# Patient Record
Sex: Male | Born: 1975 | Race: Black or African American | Hispanic: No | Marital: Single | Smoking: Former smoker
Health system: Southern US, Community
[De-identification: ages and names within clinical notes are randomized; demographics above are authoritative.]

## PROBLEM LIST (undated history)

## (undated) ENCOUNTER — Emergency Department (HOSPITAL_COMMUNITY): Admission: EM | Payer: 59 | Source: Home / Self Care

## (undated) DIAGNOSIS — M549 Dorsalgia, unspecified: Secondary | ICD-10-CM

## (undated) DIAGNOSIS — K219 Gastro-esophageal reflux disease without esophagitis: Secondary | ICD-10-CM

## (undated) DIAGNOSIS — F419 Anxiety disorder, unspecified: Secondary | ICD-10-CM

## (undated) DIAGNOSIS — H9192 Unspecified hearing loss, left ear: Secondary | ICD-10-CM

## (undated) DIAGNOSIS — R569 Unspecified convulsions: Secondary | ICD-10-CM

## (undated) DIAGNOSIS — G4730 Sleep apnea, unspecified: Secondary | ICD-10-CM

## (undated) DIAGNOSIS — F32A Depression, unspecified: Secondary | ICD-10-CM

## (undated) DIAGNOSIS — F329 Major depressive disorder, single episode, unspecified: Secondary | ICD-10-CM

## (undated) DIAGNOSIS — J45909 Unspecified asthma, uncomplicated: Secondary | ICD-10-CM

## (undated) DIAGNOSIS — I10 Essential (primary) hypertension: Secondary | ICD-10-CM

## (undated) DIAGNOSIS — G8929 Other chronic pain: Secondary | ICD-10-CM

---

## 2001-10-02 ENCOUNTER — Encounter: Payer: Self-pay | Admitting: Emergency Medicine

## 2001-10-02 ENCOUNTER — Emergency Department (HOSPITAL_COMMUNITY): Admission: EM | Admit: 2001-10-02 | Discharge: 2001-10-02 | Payer: Self-pay | Admitting: Emergency Medicine

## 2002-06-09 ENCOUNTER — Emergency Department (HOSPITAL_COMMUNITY): Admission: EM | Admit: 2002-06-09 | Discharge: 2002-06-09 | Payer: Self-pay | Admitting: Emergency Medicine

## 2002-12-05 ENCOUNTER — Encounter: Payer: Self-pay | Admitting: Emergency Medicine

## 2002-12-05 ENCOUNTER — Emergency Department (HOSPITAL_COMMUNITY): Admission: EM | Admit: 2002-12-05 | Discharge: 2002-12-05 | Payer: Self-pay | Admitting: Emergency Medicine

## 2003-02-09 ENCOUNTER — Emergency Department (HOSPITAL_COMMUNITY): Admission: EM | Admit: 2003-02-09 | Discharge: 2003-02-10 | Payer: Self-pay | Admitting: Emergency Medicine

## 2003-02-19 ENCOUNTER — Emergency Department (HOSPITAL_COMMUNITY): Admission: EM | Admit: 2003-02-19 | Discharge: 2003-02-19 | Payer: Self-pay | Admitting: Emergency Medicine

## 2003-02-27 ENCOUNTER — Emergency Department (HOSPITAL_COMMUNITY): Admission: EM | Admit: 2003-02-27 | Discharge: 2003-02-27 | Payer: Self-pay | Admitting: Emergency Medicine

## 2003-02-28 ENCOUNTER — Emergency Department (HOSPITAL_COMMUNITY): Admission: EM | Admit: 2003-02-28 | Discharge: 2003-02-28 | Payer: Self-pay | Admitting: Emergency Medicine

## 2003-10-18 ENCOUNTER — Emergency Department (HOSPITAL_COMMUNITY): Admission: EM | Admit: 2003-10-18 | Discharge: 2003-10-18 | Payer: Self-pay | Admitting: Emergency Medicine

## 2003-12-04 ENCOUNTER — Emergency Department (HOSPITAL_COMMUNITY): Admission: EM | Admit: 2003-12-04 | Discharge: 2003-12-04 | Payer: Self-pay | Admitting: Emergency Medicine

## 2004-02-03 ENCOUNTER — Emergency Department (HOSPITAL_COMMUNITY): Admission: EM | Admit: 2004-02-03 | Discharge: 2004-02-04 | Payer: Self-pay | Admitting: Emergency Medicine

## 2004-02-10 ENCOUNTER — Emergency Department (HOSPITAL_COMMUNITY): Admission: EM | Admit: 2004-02-10 | Discharge: 2004-02-10 | Payer: Self-pay | Admitting: Emergency Medicine

## 2004-05-16 ENCOUNTER — Emergency Department (HOSPITAL_COMMUNITY): Admission: EM | Admit: 2004-05-16 | Discharge: 2004-05-17 | Payer: Self-pay | Admitting: Emergency Medicine

## 2005-05-11 ENCOUNTER — Emergency Department (HOSPITAL_COMMUNITY): Admission: EM | Admit: 2005-05-11 | Discharge: 2005-05-11 | Payer: Self-pay | Admitting: Emergency Medicine

## 2005-12-31 ENCOUNTER — Emergency Department (HOSPITAL_COMMUNITY): Admission: EM | Admit: 2005-12-31 | Discharge: 2005-12-31 | Payer: Self-pay | Admitting: Emergency Medicine

## 2006-04-22 ENCOUNTER — Ambulatory Visit: Payer: Self-pay | Admitting: Family Medicine

## 2006-04-23 ENCOUNTER — Encounter: Payer: Self-pay | Admitting: Family Medicine

## 2006-04-23 DIAGNOSIS — K219 Gastro-esophageal reflux disease without esophagitis: Secondary | ICD-10-CM | POA: Insufficient documentation

## 2006-04-23 DIAGNOSIS — R569 Unspecified convulsions: Secondary | ICD-10-CM

## 2006-04-23 DIAGNOSIS — J309 Allergic rhinitis, unspecified: Secondary | ICD-10-CM | POA: Insufficient documentation

## 2006-04-23 DIAGNOSIS — E669 Obesity, unspecified: Secondary | ICD-10-CM

## 2006-04-23 DIAGNOSIS — J45909 Unspecified asthma, uncomplicated: Secondary | ICD-10-CM | POA: Insufficient documentation

## 2006-04-23 DIAGNOSIS — J40 Bronchitis, not specified as acute or chronic: Secondary | ICD-10-CM

## 2006-04-23 DIAGNOSIS — I10 Essential (primary) hypertension: Secondary | ICD-10-CM | POA: Insufficient documentation

## 2006-04-23 DIAGNOSIS — F329 Major depressive disorder, single episode, unspecified: Secondary | ICD-10-CM

## 2006-04-23 LAB — CONVERTED CEMR LAB
ALT: 28 units/L (ref 0–53)
AST: 18 units/L (ref 0–37)
Albumin: 4.1 g/dL (ref 3.5–5.2)
Alkaline Phosphatase: 89 units/L (ref 39–117)
BUN: 13 mg/dL (ref 6–23)
CO2: 22 meq/L (ref 19–32)
Calcium: 9.3 mg/dL (ref 8.4–10.5)
Chloride: 107 meq/L (ref 96–112)
Cholesterol: 179 mg/dL (ref 0–200)
Creatinine, Ser: 1 mg/dL (ref 0.40–1.50)
Glucose, Bld: 113 mg/dL — ABNORMAL HIGH (ref 70–99)
HDL: 39 mg/dL — ABNORMAL LOW (ref >39)
LDL Cholesterol: 128 mg/dL — ABNORMAL HIGH (ref 0–99)
Potassium: 3.9 meq/L (ref 3.5–5.3)
Sodium: 142 meq/L (ref 135–145)
Total Bilirubin: 0.5 mg/dL (ref 0.3–1.2)
Total CHOL/HDL Ratio: 4.6
Total Protein: 7 g/dL (ref 6.0–8.3)
Triglycerides: 62 mg/dL (ref <150)
VLDL: 12 mg/dL (ref 0–40)

## 2006-05-06 ENCOUNTER — Ambulatory Visit: Payer: Self-pay | Admitting: Family Medicine

## 2006-05-07 ENCOUNTER — Ambulatory Visit: Payer: Self-pay | Admitting: Family Medicine

## 2006-05-08 ENCOUNTER — Encounter (INDEPENDENT_AMBULATORY_CARE_PROVIDER_SITE_OTHER): Payer: Self-pay | Admitting: Family Medicine

## 2006-05-08 LAB — CONVERTED CEMR LAB
ALT: 27 units/L (ref 0–53)
AST: 21 units/L (ref 0–37)
Albumin: 3.9 g/dL (ref 3.5–5.2)
Alkaline Phosphatase: 83 units/L (ref 39–117)
BUN: 15 mg/dL (ref 6–23)
Basophils Absolute: 0 10*3/uL (ref 0.0–0.1)
Basophils Relative: 0 % (ref 0–1)
CO2: 14 meq/L — ABNORMAL LOW (ref 19–32)
Calcium: 8.8 mg/dL (ref 8.4–10.5)
Chloride: 110 meq/L (ref 96–112)
Cholesterol: 175 mg/dL (ref 0–200)
Creatinine, Ser: 0.87 mg/dL (ref 0.40–1.50)
Eosinophils Absolute: 0.2 10*3/uL (ref 0.0–0.7)
Eosinophils Relative: 2 % (ref 0–5)
Glucose, Bld: 119 mg/dL — ABNORMAL HIGH (ref 70–99)
HCT: 46.5 % (ref 39.0–52.0)
HDL: 38 mg/dL — ABNORMAL LOW (ref >39)
Hemoglobin: 14.9 g/dL (ref 13.0–17.0)
LDL Cholesterol: 121 mg/dL — ABNORMAL HIGH (ref 0–99)
Lymphocytes Relative: 40 % (ref 12–46)
Lymphs Abs: 2.6 10*3/uL (ref 0.7–3.3)
MCHC: 32 g/dL (ref 30.0–36.0)
MCV: 82.3 fL (ref 78.0–100.0)
Monocytes Absolute: 0.4 10*3/uL (ref 0.2–0.7)
Monocytes Relative: 7 % (ref 3–11)
Neutro Abs: 3.3 10*3/uL (ref 1.7–7.7)
Neutrophils Relative %: 51 % (ref 43–77)
Platelets: 240 10*3/uL (ref 150–400)
Potassium: 4.1 meq/L (ref 3.5–5.3)
RBC: 5.65 M/uL (ref 4.22–5.81)
RDW: 13.6 % (ref 11.5–14.0)
Sodium: 141 meq/L (ref 135–145)
TSH: 3.54 microintl units/mL (ref 0.350–5.50)
Total Bilirubin: 0.4 mg/dL (ref 0.3–1.2)
Total CHOL/HDL Ratio: 4.6
Total Protein: 6.8 g/dL (ref 6.0–8.3)
Triglycerides: 81 mg/dL (ref <150)
VLDL: 16 mg/dL (ref 0–40)
WBC: 6.4 10*3/uL (ref 4.0–10.5)

## 2006-06-05 ENCOUNTER — Encounter (INDEPENDENT_AMBULATORY_CARE_PROVIDER_SITE_OTHER): Payer: Self-pay | Admitting: Family Medicine

## 2006-06-27 ENCOUNTER — Ambulatory Visit: Payer: Self-pay | Admitting: Family Medicine

## 2006-06-27 DIAGNOSIS — G4733 Obstructive sleep apnea (adult) (pediatric): Secondary | ICD-10-CM | POA: Insufficient documentation

## 2006-06-27 DIAGNOSIS — R7989 Other specified abnormal findings of blood chemistry: Secondary | ICD-10-CM | POA: Insufficient documentation

## 2006-06-27 DIAGNOSIS — E785 Hyperlipidemia, unspecified: Secondary | ICD-10-CM

## 2006-06-27 DIAGNOSIS — G4730 Sleep apnea, unspecified: Secondary | ICD-10-CM | POA: Insufficient documentation

## 2006-06-27 DIAGNOSIS — J301 Allergic rhinitis due to pollen: Secondary | ICD-10-CM | POA: Insufficient documentation

## 2006-06-27 LAB — CONVERTED CEMR LAB
Cholesterol, target level: 200 mg/dL
Glucose, Bld: 116 mg/dL
HDL goal, serum: 40 mg/dL
Hgb A1c MFr Bld: 5.6 %
LDL Goal: 130 mg/dL

## 2006-07-01 ENCOUNTER — Encounter (INDEPENDENT_AMBULATORY_CARE_PROVIDER_SITE_OTHER): Payer: Self-pay | Admitting: Family Medicine

## 2006-11-04 ENCOUNTER — Encounter (INDEPENDENT_AMBULATORY_CARE_PROVIDER_SITE_OTHER): Payer: Self-pay | Admitting: Family Medicine

## 2008-01-28 ENCOUNTER — Emergency Department (HOSPITAL_COMMUNITY): Admission: EM | Admit: 2008-01-28 | Discharge: 2008-01-28 | Payer: Self-pay | Admitting: Emergency Medicine

## 2008-02-03 ENCOUNTER — Ambulatory Visit: Payer: Self-pay | Admitting: Family Medicine

## 2008-02-03 DIAGNOSIS — L659 Nonscarring hair loss, unspecified: Secondary | ICD-10-CM | POA: Insufficient documentation

## 2008-02-03 DIAGNOSIS — H5789 Other specified disorders of eye and adnexa: Secondary | ICD-10-CM

## 2008-04-06 ENCOUNTER — Encounter (INDEPENDENT_AMBULATORY_CARE_PROVIDER_SITE_OTHER): Payer: Self-pay | Admitting: Family Medicine

## 2011-02-19 ENCOUNTER — Emergency Department (HOSPITAL_COMMUNITY)
Admission: EM | Admit: 2011-02-19 | Discharge: 2011-02-19 | Disposition: A | Discharge status: Home / Self Care | Payer: BC Managed Care – PPO | Attending: Emergency Medicine | Admitting: Emergency Medicine

## 2011-02-19 ENCOUNTER — Encounter: Payer: Self-pay | Admitting: Emergency Medicine

## 2011-02-19 ENCOUNTER — Other Ambulatory Visit: Payer: Self-pay

## 2011-02-19 ENCOUNTER — Emergency Department (HOSPITAL_COMMUNITY): Payer: BC Managed Care – PPO

## 2011-02-19 DIAGNOSIS — R5381 Other malaise: Secondary | ICD-10-CM | POA: Insufficient documentation

## 2011-02-19 DIAGNOSIS — Y9269 Other specified industrial and construction area as the place of occurrence of the external cause: Secondary | ICD-10-CM | POA: Insufficient documentation

## 2011-02-19 DIAGNOSIS — R61 Generalized hyperhidrosis: Secondary | ICD-10-CM | POA: Insufficient documentation

## 2011-02-19 DIAGNOSIS — I10 Essential (primary) hypertension: Secondary | ICD-10-CM

## 2011-02-19 DIAGNOSIS — R5383 Other fatigue: Secondary | ICD-10-CM | POA: Insufficient documentation

## 2011-02-19 DIAGNOSIS — R079 Chest pain, unspecified: Secondary | ICD-10-CM | POA: Insufficient documentation

## 2011-02-19 DIAGNOSIS — X500XXA Overexertion from strenuous movement or load, initial encounter: Secondary | ICD-10-CM | POA: Insufficient documentation

## 2011-02-19 DIAGNOSIS — R0602 Shortness of breath: Secondary | ICD-10-CM | POA: Insufficient documentation

## 2011-02-19 LAB — CBC
HCT: 41.4 % (ref 39.0–52.0)
Hemoglobin: 13.3 g/dL (ref 13.0–17.0)
MCH: 26.2 pg (ref 26.0–34.0)
MCHC: 32.1 g/dL (ref 30.0–36.0)
MCV: 81.7 fL (ref 78.0–100.0)
Platelets: 255 10*3/uL (ref 150–400)
RBC: 5.07 MIL/uL (ref 4.22–5.81)
RDW: 13.1 % (ref 11.5–15.5)
WBC: 4.9 10*3/uL (ref 4.0–10.5)

## 2011-02-19 LAB — CARDIAC PANEL(CRET KIN+CKTOT+MB+TROPI)
CK, MB: 13.8 ng/mL (ref 0.3–4.0)
Relative Index: 2.1 (ref 0.0–2.5)
Total CK: 663 U/L — ABNORMAL HIGH (ref 7–232)
Troponin I: <0.3 ng/mL (ref <0.30)

## 2011-02-19 LAB — BASIC METABOLIC PANEL
BUN: 10 mg/dL (ref 6–23)
CO2: 24 mEq/L (ref 19–32)
Calcium: 9.2 mg/dL (ref 8.4–10.5)
Chloride: 106 mEq/L (ref 96–112)
Creatinine, Ser: 0.98 mg/dL (ref 0.50–1.35)
GFR calc Af Amer: >90 mL/min (ref >90)
GFR calc non Af Amer: >90 mL/min (ref >90)
Glucose, Bld: 99 mg/dL (ref 70–99)
Potassium: 3.6 mEq/L (ref 3.5–5.1)
Sodium: 137 mEq/L (ref 135–145)

## 2011-02-19 LAB — DIFFERENTIAL
Basophils Absolute: 0 10*3/uL (ref 0.0–0.1)
Basophils Relative: 0 % (ref 0–1)
Eosinophils Absolute: 0.2 10*3/uL (ref 0.0–0.7)
Eosinophils Relative: 3 % (ref 0–5)
Lymphocytes Relative: 43 % (ref 12–46)
Lymphs Abs: 2.1 10*3/uL (ref 0.7–4.0)
Monocytes Absolute: 0.4 10*3/uL (ref 0.1–1.0)
Monocytes Relative: 9 % (ref 3–12)
Neutro Abs: 2.2 10*3/uL (ref 1.7–7.7)
Neutrophils Relative %: 45 % (ref 43–77)

## 2011-02-19 MED ORDER — ASPIRIN 81 MG PO CHEW: 81.0000 mg | CHEWABLE_TABLET | Freq: Every day | ORAL | Status: DC

## 2011-02-19 MED ORDER — NITROGLYCERIN 0.4 MG SL SUBL
0.4000 mg | SUBLINGUAL_TABLET | Freq: Once | SUBLINGUAL | Status: AC
  Administered 2011-02-19: 0.4 mg via SUBLINGUAL
  Filled 2011-02-19: qty 25

## 2011-02-19 MED ORDER — ASPIRIN 81 MG PO CHEW
324.0000 mg | CHEWABLE_TABLET | Freq: Once | ORAL | Status: AC
  Administered 2011-02-19: 324 mg via ORAL
  Filled 2011-02-19: qty 4

## 2011-02-19 NOTE — ED Notes (Signed)
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 States he has to lift heavy boxes at work and has not been resting a lot.

## 2011-02-19 NOTE — ED Notes (Signed)
Pt c/o sharp intermittent chest pain since yesterday.

## 2011-02-19 NOTE — ED Provider Notes (Signed)
History     CSN: [id number] Arrival date & time: 02/19/2011  6:10 PM   First MD Initiated Contact with Patient 02/19/11 1907     Chief Complaint  Patient presents with  . Chest Pain     Patient is a 35 y.o. male presenting with chest pain. The history is provided by the patient.  Chest Pain The chest pain began yesterday. Duration of episode(s) is 1 hour. Chest pain occurs intermittently. The chest pain is improving. The pain is associated with exertion. The severity of the pain is moderate. The quality of the pain is described as pressure-like. The pain does not radiate. Chest pain is worsened by exertion. Primary symptoms include fatigue and shortness of breath.  Associated symptoms include diaphoresis. He tried nothing for the symptoms.   pt reports while at work yesterday he was lifting heavy bags (50lbs) and noted chest pressure/sob/diaphoresis.  Relieved after sitting down He had another episode today at rest Now CP free Never had this before No known medical problems  PMH - none  History reviewed. No pertinent past surgical history.  History reviewed. No pertinent family history.  History  Substance Use Topics  . Smoking status: Not on file  . Smokeless tobacco: Not on file  . Alcohol Use:    Soc hx - negative for smoking   Review of Systems  Constitutional: Positive for diaphoresis and fatigue.  Respiratory: Positive for shortness of breath.   Cardiovascular: Positive for chest pain.  All other systems reviewed and are negative.    Allergies  Review of patient's allergies indicates no known allergies.  Home Medications  No current outpatient prescriptions on file.  BP 186/89  Pulse 87  Temp(Src) 98.4 F (36.9 C) (Oral)  Resp 20  Ht 5\' 11"  (1.803 m)  SpO2 99%  Physical Exam  CONSTITUTIONAL: Well developed/well nourished HEAD AND FACE: Normocephalic/atraumatic EYES: EOMI/PERRL ENMT: Mucous membranes moist NECK: supple no meningeal  signs SPINE:entire spine nontender CV: S1/S2 noted, no murmurs/rubs/gallops noted LUNGS: Lungs are clear to auscultation bilaterally, no apparent distress ABDOMEN: soft, nontender, no rebound or guarding, obese NEURO: Pt is awake/alert, moves all extremitiesx4 EXTREMITIES: pulses normal, full ROM SKIN: warm, color normal PSYCH: no abnormalities of mood noted    ED Course  Procedures   Labs Reviewed  CARDIAC PANEL(CRET KIN+CKTOT+MB+TROPI) - Abnormal; Notable for the following:    Total CK 663 (*)    All other components within normal limits  CBC  DIFFERENTIAL  BASIC METABOLIC PANEL   Dg Chest Port 1 View  02/19/2011  *RADIOLOGY REPORT*  Clinical Data: Central chest pain following heavy lifting today.  PORTABLE CHEST - 1 VIEW  Comparison: 02/04/2004.  Findings: Enlarged cardiac silhouette with a mild increase in size, magnified by the portable AP technique.  Clear lungs with normal vascularity.  The visualized bones are unremarkable.  IMPRESSION: Cardiomegaly.  No acute abnormality.  Original Report Authenticated By: Darrol Angel, M.D.    I had a long discussion with patient and his family.  I told him this could be related to ACS, though he had negative troponin and nondiagnostic EKG.  I told him this could worsen and I offered him admission.  He refused admission, he reports he feels improved, and will f/u as outpatient.  I started him on ASA and I advised him on need for f/u for his BP.  My suspicion for PE/Dissection is low.  I advised him to return at anytime  MDM  Nursing notes reviewed and  considered in documentation All labs/vitals reviewed and considered xrays reviewed and considered    Date: 02/19/2011  Rate: 80  Rhythm: normal sinus rhythm  QRS Axis: right  Intervals: normal  ST/T Wave abnormalities: normal and poor R wave progression  Conduction Disutrbances:none  Narrative Interpretation:   Old EKG Reviewed: none available          Joya Gaskins,  MD 02/19/11 2142

## 2011-03-16 ENCOUNTER — Emergency Department (HOSPITAL_COMMUNITY): Payer: BC Managed Care – PPO

## 2011-03-16 ENCOUNTER — Emergency Department (HOSPITAL_COMMUNITY)
Admission: EM | Admit: 2011-03-16 | Discharge: 2011-03-16 | Disposition: A | Discharge status: Home / Self Care | Payer: BC Managed Care – PPO | Attending: Emergency Medicine | Admitting: Emergency Medicine

## 2011-03-16 ENCOUNTER — Other Ambulatory Visit: Payer: Self-pay

## 2011-03-16 ENCOUNTER — Encounter (HOSPITAL_COMMUNITY): Payer: Self-pay | Admitting: *Deleted

## 2011-03-16 DIAGNOSIS — R071 Chest pain on breathing: Secondary | ICD-10-CM | POA: Insufficient documentation

## 2011-03-16 DIAGNOSIS — Z7982 Long term (current) use of aspirin: Secondary | ICD-10-CM | POA: Insufficient documentation

## 2011-03-16 DIAGNOSIS — R5381 Other malaise: Secondary | ICD-10-CM | POA: Insufficient documentation

## 2011-03-16 LAB — BASIC METABOLIC PANEL
BUN: 17 mg/dL (ref 6–23)
CO2: 25 mEq/L (ref 19–32)
Calcium: 9.6 mg/dL (ref 8.4–10.5)
Chloride: 105 mEq/L (ref 96–112)
Creatinine, Ser: 1.06 mg/dL (ref 0.50–1.35)
GFR calc Af Amer: >90 mL/min (ref >90)
GFR calc non Af Amer: >90 mL/min (ref >90)
Glucose, Bld: 96 mg/dL (ref 70–99)
Potassium: 3.6 mEq/L (ref 3.5–5.1)
Sodium: 139 mEq/L (ref 135–145)

## 2011-03-16 LAB — CBC
HCT: 46.8 % (ref 39.0–52.0)
Hemoglobin: 15.4 g/dL (ref 13.0–17.0)
MCH: 27 pg (ref 26.0–34.0)
MCHC: 32.9 g/dL (ref 30.0–36.0)
MCV: 82.1 fL (ref 78.0–100.0)
Platelets: 275 10*3/uL (ref 150–400)
RBC: 5.7 MIL/uL (ref 4.22–5.81)
RDW: 13.2 % (ref 11.5–15.5)
WBC: 7.2 10*3/uL (ref 4.0–10.5)

## 2011-03-16 LAB — POCT I-STAT TROPONIN I: Troponin i, poc: 0 ng/mL (ref 0.00–0.08)

## 2011-03-16 MED ORDER — IBUPROFEN 800 MG PO TABS: 800.0000 mg | ORAL_TABLET | Freq: Three times a day (TID) | ORAL | Status: AC

## 2011-03-16 NOTE — ED Provider Notes (Signed)
7:31 PM  I performed a history and physical examination of Bobby Bridges and discussed his management with Dr. Louanne Belton  I agree with the history, physical, assessment, and plan of care, with the following exceptions: None  I was present for the following procedures: None Time Spent in Critical Care of the patient: None Time spent in discussions with the patient and family: 5 minutes  Manus Rudd, MD 03/16/11 1932

## 2011-03-16 NOTE — ED Notes (Signed)
Pt c/o left sided chest pain x 3 weeks

## 2011-03-16 NOTE — ED Provider Notes (Signed)
History     CSN: [id number] Arrival date & time: 03/16/2011  5:24 PM   First MD Initiated Contact with Patient 03/16/11 1728      Chief Complaint  Patient presents with  . Chest Pain   HPI 35 yo obese M with no known medical problems who presents with complaints of intermittent left sided CP and low energy x3 weeks.  Pt has been previously seen in the ED for similar complaints about 3 weeks ago and workup was negative.  He reports that, since that time, he has continued to have dull, achy left sided chest pain intermittently along with general lack of energy.  He does not report any radiation of the chest pain, it does not appear to be associated with exertion, is relieved by nothing, and brought on by nothing.  Location is at the base of the rib cage.  Otherwise, does not report any fevers/chills, visual changes, lightheadedness, or other concerning symptoms.  History reviewed. No pertinent past medical history.  History reviewed. No pertinent past surgical history.  History reviewed. No pertinent family history.  History  Substance Use Topics  . Smoking status: Never Smoker   . Smokeless tobacco: Not on file  . Alcohol Use: No      Review of Systems  Constitutional: Positive for fatigue. Negative for fever, chills, diaphoresis, appetite change and unexpected weight change.  HENT: Negative.   Eyes: Negative.   Respiratory: Negative for cough, chest tightness, shortness of breath, wheezing and stridor.   Cardiovascular: Positive for chest pain. Negative for palpitations and leg swelling.  Gastrointestinal: Negative.   Genitourinary: Negative.   Musculoskeletal: Negative.   Neurological: Negative.   Hematological: Negative.     Allergies  Review of patient's allergies indicates no known allergies.  Home Medications   Current Outpatient Rx  Name Route Sig Dispense Refill  . ASPIRIN 81 MG PO CHEW Oral Chew 1 tablet (81 mg total) by mouth daily. 14 tablet 0  . NYQUIL PO  Oral Take 10-15 mLs by mouth at bedtime as needed. For cold symptoms       BP 111/74  Pulse 79  Temp(Src) 98 F (36.7 C) (Oral)  Resp 22  SpO2 99%  Physical Exam  Constitutional: He is oriented to person, place, and time.       Morbidly obese male, no acute distress  HENT:  Head: Normocephalic and atraumatic.  Eyes: Conjunctivae and EOM are normal.  Neck: Normal range of motion. Neck supple.  Cardiovascular: Normal rate, regular rhythm and normal heart sounds.   Pulmonary/Chest: Effort normal and breath sounds normal. He exhibits tenderness.       Chest tenderness over the left 10th rib tip, otherwise no tenderness  Abdominal: Soft. Bowel sounds are normal. He exhibits no distension. There is no tenderness.  Musculoskeletal: Normal range of motion. He exhibits no edema.  Neurological: He is alert and oriented to person, place, and time. No cranial nerve deficit.  Skin: Skin is warm and dry.    ED Course  Procedures (including critical care time)   Labs Reviewed  CBC  BASIC METABOLIC PANEL  POCT I-STAT TROPONIN I  I-STAT TROPONIN I   Dg Chest Portable 1 View  03/16/2011  *RADIOLOGY REPORT*  Clinical Data: Chest pain.  PORTABLE CHEST - 1 VIEW  Comparison: Plain film chest 02/19/2011 and 02/04/2004.  Findings: There is cardiomegaly.  Lungs are clear.  No pneumothorax or pleural effusion.  IMPRESSION: Cardiomegaly without acute disease.  Original Report Authenticated By:  THOMAS L. Maricela Curet, M.D.     No diagnosis found.   Date: 03/16/2011  Rate: 82  Rhythm: normal sinus rhythm  QRS Axis: normal  Intervals: normal  ST/T Wave abnormalities: normal  Conduction Disutrbances:none  Narrative Interpretation: Normal EKG with no concerning findings  Old EKG Reviewed: unchanged     MDM  Pt with reproducible chest pain, no history of typical angina, currently chest pain free with normal EKG and negative troponin.  Will d/c home with NSAIDS and resource guide for finding  PCP.        Majel Homer, MD 03/16/11 [phone]

## 2011-03-18 NOTE — ED Provider Notes (Signed)
Evaluation and management procedures were performed by the resident physician under my supervision/collaboration.  I evaluated this patient face-to-face at the time of encounter.  Please see my note dated at that time.  Felisa Bonier, MD 03/18/11 [phone]

## 2011-07-28 ENCOUNTER — Encounter (HOSPITAL_COMMUNITY): Payer: Self-pay | Admitting: *Deleted

## 2011-07-28 ENCOUNTER — Emergency Department (HOSPITAL_COMMUNITY)
Admission: EM | Admit: 2011-07-28 | Discharge: 2011-07-28 | Disposition: A | Discharge status: Home / Self Care | Payer: BC Managed Care – PPO | Attending: Emergency Medicine | Admitting: Emergency Medicine

## 2011-07-28 DIAGNOSIS — X58XXXA Exposure to other specified factors, initial encounter: Secondary | ICD-10-CM | POA: Insufficient documentation

## 2011-07-28 DIAGNOSIS — R350 Frequency of micturition: Secondary | ICD-10-CM | POA: Insufficient documentation

## 2011-07-28 DIAGNOSIS — S335XXA Sprain of ligaments of lumbar spine, initial encounter: Secondary | ICD-10-CM | POA: Insufficient documentation

## 2011-07-28 DIAGNOSIS — R3915 Urgency of urination: Secondary | ICD-10-CM | POA: Insufficient documentation

## 2011-07-28 DIAGNOSIS — R109 Unspecified abdominal pain: Secondary | ICD-10-CM | POA: Insufficient documentation

## 2011-07-28 DIAGNOSIS — R300 Dysuria: Secondary | ICD-10-CM | POA: Insufficient documentation

## 2011-07-28 DIAGNOSIS — S39012A Strain of muscle, fascia and tendon of lower back, initial encounter: Secondary | ICD-10-CM

## 2011-07-28 LAB — URINALYSIS, ROUTINE W REFLEX MICROSCOPIC
Bilirubin Urine: NEGATIVE
Hgb urine dipstick: NEGATIVE
Ketones, ur: NEGATIVE mg/dL
Protein, ur: NEGATIVE mg/dL
Urobilinogen, UA: 0.2 mg/dL (ref 0.0–1.0)

## 2011-07-28 LAB — BASIC METABOLIC PANEL
Calcium: 9.1 mg/dL (ref 8.4–10.5)
Creatinine, Ser: 1.02 mg/dL (ref 0.50–1.35)
GFR calc non Af Amer: >90 mL/min (ref >90)
Glucose, Bld: 105 mg/dL — ABNORMAL HIGH (ref 70–99)
Sodium: 137 mEq/L (ref 135–145)

## 2011-07-28 LAB — CBC
MCH: 26.6 pg (ref 26.0–34.0)
MCV: 82.2 fL (ref 78.0–100.0)
Platelets: 235 10*3/uL (ref 150–400)
RDW: 13.3 % (ref 11.5–15.5)

## 2011-07-28 LAB — DIFFERENTIAL
Basophils Absolute: 0 10*3/uL (ref 0.0–0.1)
Eosinophils Absolute: 0.2 10*3/uL (ref 0.0–0.7)
Eosinophils Relative: 4 % (ref 0–5)

## 2011-07-28 MED ORDER — SODIUM CHLORIDE 0.9 % IV SOLN
Freq: Once | INTRAVENOUS | Status: AC
  Administered 2011-07-28: 20 mL/h via INTRAVENOUS

## 2011-07-28 MED ORDER — HYDROCODONE-ACETAMINOPHEN 5-325 MG PO TABS: 2.0000 | ORAL_TABLET | ORAL | Status: AC | PRN

## 2011-07-28 MED ORDER — IBUPROFEN 800 MG PO TABS: 800.0000 mg | ORAL_TABLET | Freq: Three times a day (TID) | ORAL | Status: AC | PRN

## 2011-07-28 MED ORDER — KETOROLAC TROMETHAMINE 30 MG/ML IJ SOLN
30.0000 mg | Freq: Once | INTRAMUSCULAR | Status: AC
  Administered 2011-07-28: 30 mg via INTRAVENOUS
  Filled 2011-07-28: qty 1

## 2011-07-28 MED ORDER — HYDROMORPHONE HCL PF 1 MG/ML IJ SOLN
1.0000 mg | Freq: Once | INTRAMUSCULAR | Status: AC
  Administered 2011-07-28: 1 mg via INTRAVENOUS
  Filled 2011-07-28: qty 1

## 2011-07-28 MED ORDER — ONDANSETRON HCL 4 MG/2ML IJ SOLN
4.0000 mg | Freq: Once | INTRAMUSCULAR | Status: AC
Start: 2011-07-28 — End: 2011-07-28
  Administered 2011-07-28: 4 mg via INTRAVENOUS
  Filled 2011-07-28: qty 2

## 2011-07-28 NOTE — ED Provider Notes (Signed)
History     CSN: [id number]  Arrival date & time 07/28/11  0153   First MD Initiated Contact with Patient 07/28/11 0154      Chief Complaint  Patient presents with  . Flank Pain  . Dysuria    (Consider location/radiation/quality/duration/timing/severity/associated sxs/prior treatment) Patient is a 36 y.o. male presenting with flank pain and dysuria. The history is provided by the patient.  Flank Pain This is a new problem. The current episode started more than 2 days ago (2-3 days ago). The problem occurs constantly. The problem has not changed since onset.Pertinent negatives include no chest pain, no abdominal pain, no headaches and no shortness of breath. Exacerbated by: palpation, twisting or torsion or movement through the torso. The symptoms are relieved by NSAIDs and acetaminophen (urinating).  Dysuria  This is a new problem. The current episode started 2 days ago. The problem occurs every urination. The problem has not changed since onset.The quality of the pain is described as aching (pain at the left flank improved by urination, with increased frequency of urination. No noted hematuria, and no burning with urination or penile/urethral pain. No penile discharge.). The pain is at a severity of 6/10. The pain is moderate. There has been no fever. There is no history of pyelonephritis. Associated symptoms include frequency, urgency and flank pain. Pertinent negatives include no chills, no sweats, no nausea, no vomiting, no discharge, no hematuria and no hesitancy. His past medical history does not include kidney stones or recurrent UTIs.    History reviewed. No pertinent past medical history.  History reviewed. No pertinent past surgical history.  History reviewed. No pertinent family history.  History  Substance Use Topics  . Smoking status: Never Smoker   . Smokeless tobacco: Not on file  . Alcohol Use: No      Review of Systems  Constitutional: Negative for fever,  chills, activity change, appetite change and fatigue.  HENT: Negative.   Eyes: Negative.   Respiratory: Negative for cough, shortness of breath and wheezing.   Cardiovascular: Negative for chest pain.  Gastrointestinal: Negative for nausea, vomiting, abdominal pain, diarrhea, constipation, blood in stool, abdominal distention, anal bleeding and rectal pain.  Genitourinary: Positive for dysuria, urgency, frequency and flank pain. Negative for hesitancy, hematuria, decreased urine volume, discharge, penile swelling, scrotal swelling, difficulty urinating, genital sores, penile pain and testicular pain.  Musculoskeletal: Positive for back pain.       Left flank pain without midline spinal pain  Skin: Negative for color change and rash.  Neurological: Negative for light-headedness and headaches.  Hematological: Does not bruise/bleed easily.  Psychiatric/Behavioral: Negative.     Allergies  Review of patient's allergies indicates no known allergies.  Home Medications   Current Outpatient Rx  Name Route Sig Dispense Refill  . ASPIRIN 81 MG PO CHEW Oral Chew 1 tablet (81 mg total) by mouth daily. 14 tablet 0    BP 128/79  Pulse 82  Temp 97.6 F (36.4 C)  Resp 20  Wt 338 lb 5 oz (153.458 kg)  SpO2 97%  Physical Exam  Nursing note and vitals reviewed. Constitutional: He is oriented to person, place, and time. He appears well-nourished. No distress.       The patient has very large body habitus and is morbidly obese  HENT:  Head: Normocephalic and atraumatic.  Eyes: Conjunctivae and EOM are normal. Pupils are equal, round, and reactive to light.  Neck: Normal range of motion. Neck supple. No JVD present.  Cardiovascular: Normal  rate, regular rhythm, normal heart sounds and intact distal pulses.  Exam reveals no gallop and no friction rub.   No murmur heard. Pulmonary/Chest: Effort normal and breath sounds normal. No respiratory distress. He has no wheezes. He has no rales. He  exhibits no tenderness.  Abdominal: Soft. Bowel sounds are normal. He exhibits no distension, no fluid wave, no ascites and no mass. There is no tenderness. There is CVA tenderness. There is no rebound and no guarding.    Musculoskeletal: Normal range of motion. He exhibits no edema and no tenderness.       Lumbar back: Normal. He exhibits normal range of motion, no tenderness, no bony tenderness, no deformity, no pain and no spasm.       Back:  Neurological: He is alert and oriented to person, place, and time. He has normal reflexes. No cranial nerve deficit. He exhibits normal muscle tone. Coordination normal.  Skin: Skin is warm and dry. No rash noted. He is not diaphoretic. No erythema. No pallor.  Psychiatric: He has a normal mood and affect. His behavior is normal. Judgment and thought content normal.    ED Course  Procedures (including critical care time)  Labs Reviewed  URINALYSIS, ROUTINE W REFLEX MICROSCOPIC - Abnormal; Notable for the following:    Specific Gravity, Urine >1.030 (*)    All other components within normal limits  CBC  DIFFERENTIAL  BASIC METABOLIC PANEL  URINE CULTURE   No results found.   No diagnosis found.    MDM  Ureteric lithiasis, kidney stone, renal colic, urinary tract infection, musculoskeletal pain due to muscular strain. Lumbar spine pathology is not suspected to be at play or the cause of the patient's current symptoms as he has no pain or tenderness along the lumbar spine. No lumbar spine radiography is indicated based on history and physical examination. The patient's urinalysis does not suggest urinary tract infection or hematuria from kidney stone. Note CT scan of the abdomen and pelvis are thought to be needed at this time. The patient's symptoms worsening with palpation and torsional movement through the torso suggest myofascial pain or muscular strain. The patient's urinary symptoms and his pain improving with urination suggest renal colic  due to urinary tract infection or kidney stone.        Felisa Bonier, MD 07/28/11 [phone]

## 2011-07-28 NOTE — ED Notes (Addendum)
Pt c/o left flank/side pain, lower back pain,  and mild dysuria x 2 days. Pt also states he doesn't feel like he has any energy. Pt wants knot on right wrist evaluated as well.

## 2011-07-28 NOTE — ED Notes (Signed)
Patient walked here and is walking home.  Family member is walking with him.

## 2011-07-28 NOTE — Discharge Instructions (Signed)
Back Exercises Back exercises help treat and prevent back injuries. The goal of back exercises is to increase the strength of your abdominal and back muscles and the flexibility of your back. These exercises should be started when you no longer have back pain. Back exercises include:  Pelvic Tilt. Lie on your back with your knees bent. Tilt your pelvis until the lower part of your back is against the floor. Hold this position 5 to 10 sec and repeat 5 to 10 times.   Knee to Chest. Pull first 1 knee up against your chest and hold for 20 to 30 seconds, repeat this with the other knee, and then both knees. This may be done with the other leg straight or bent, whichever feels better.   Sit-Ups or Curl-Ups. Bend your knees 90 degrees. Start with tilting your pelvis, and do a partial, slow sit-up, lifting your trunk only 30 to 45 degrees off the floor. Take at least 2 to 3 seconds for each sit-up. Do not do sit-ups with your knees out straight. If partial sit-ups are difficult, simply do the above but with only tightening your abdominal muscles and holding it as directed.   Hip-Lift. Lie on your back with your knees flexed 90 degrees. Push down with your feet and shoulders as you raise your hips a couple inches off the floor; hold for 10 seconds, repeat 5 to 10 times.   Back arches. Lie on your stomach, propping yourself up on bent elbows. Slowly press on your hands, causing an arch in your low back. Repeat 3 to 5 times. Any initial stiffness and discomfort should lessen with repetition over time.   Shoulder-Lifts. Lie face down with arms beside your body. Keep hips and torso pressed to floor as you slowly lift your head and shoulders off the floor.  Do not overdo your exercises, especially in the beginning. Exercises may cause you some mild back discomfort which lasts for a few minutes; however, if the pain is more severe, or lasts for more than 15 minutes, do not continue exercises until you see your  caregiver. Improvement with exercise therapy for back problems is slow.  See your caregivers for assistance with developing a proper back exercise program. Document Released: 05/03/2004 Document Revised: 03/15/2011 Document Reviewed: 03/26/2005 [facility] Patient Information 2012 [place].Back Pain, Adult Low back pain is very common. About 1 in 5 people have back pain.The cause of low back pain is rarely dangerous. The pain often gets better over time.About half of people with a sudden onset of back pain feel better in just 2 weeks. About 8 in 10 people feel better by 6 weeks.  CAUSES Some common causes of back pain include:  Strain of the muscles or ligaments supporting the spine.   Wear and tear (degeneration) of the spinal discs.   Arthritis.   Direct injury to the back.  DIAGNOSIS Most of the time, the direct cause of low back pain is not known.However, back pain can be treated effectively even when the exact cause of the pain is unknown.Answering your caregiver's questions about your overall health and symptoms is one of the most accurate ways to make sure the cause of your pain is not dangerous. If your caregiver needs more information, he or she may order lab work or imaging tests (X-rays or MRIs).However, even if imaging tests show changes in your back, this usually does not require surgery. HOME CARE INSTRUCTIONS For many people, back pain returns.Since low back pain is rarely  dangerous, it is often a condition that people can learn to [facility] their own.   Remain active. It is stressful on the back to sit or stand in one place. Do not sit, drive, or stand in one place for more than 30 minutes at a time. Take short walks on level surfaces as soon as pain allows.Try to increase the length of time you walk each day.   Do not stay in bed.Resting more than 1 or 2 days can delay your recovery.   Do not avoid exercise or work.Your body is made to move.It is not dangerous  to be active, even though your back may hurt.Your back will likely heal faster if you return to being active before your pain is gone.   Pay attention to your body when you bend and lift. Many people have less discomfortwhen lifting if they bend their knees, keep the load close to their bodies,and avoid twisting. Often, the most comfortable positions are those that put less stress on your recovering back.   Find a comfortable position to sleep. Use a firm mattress and lie on your side with your knees slightly bent. If you lie on your back, put a pillow under your knees.   Only take over-the-counter or prescription medicines as directed by your caregiver. Over-the-counter medicines to reduce pain and inflammation are often the most helpful.Your caregiver may prescribe muscle relaxant drugs.These medicines help dull your pain so you can more quickly return to your normal activities and healthy exercise.   Put ice on the injured area.   Put ice in a plastic bag.   Place a towel between your skin and the bag.   Leave the ice on for 15 to 20 minutes, 3 to 4 times a day for the first 2 to 3 days. After that, ice and heat may be alternated to reduce pain and spasms.   Ask your caregiver about trying back exercises and gentle massage. This may be of some benefit.   Avoid feeling anxious or stressed.Stress increases muscle tension and can worsen back pain.It is important to recognize when you are anxious or stressed and learn ways to manage it.Exercise is a great option.  SEEK MEDICAL CARE IF:  You have pain that is not relieved with rest or medicine.   You have pain that does not improve in 1 week.   You have new symptoms.   You are generally not feeling well.  SEEK IMMEDIATE MEDICAL CARE IF:   You have pain that radiates from your back into your legs.   You develop new bowel or bladder control problems.   You have unusual weakness or numbness in your arms or legs.   You develop  nausea or vomiting.   You develop abdominal pain.   You feel faint.  Document Released: 03/26/2005 Document Revised: 03/15/2011 Document Reviewed: 08/14/2010 [facility] Patient Information 2012 [place], [place].Cryotherapy Cryotherapy means treatment with cold. Ice or gel packs can be used to reduce both pain and swelling. Ice is the most helpful within the first 24 to 48 hours after an injury or flareup from overusing a muscle or joint. Sprains, strains, spasms, burning pain, shooting pain, and aches can all be eased with ice. Ice can also be used when recovering from surgery. Ice is effective, has very few side effects, and is safe for most people to use. PRECAUTIONS  Ice is not a safe treatment option for people with:  Raynaud's phenomenon. This is a condition affecting small blood vessels in  the extremities. Exposure to cold may cause your problems to return.   Cold hypersensitivity. There are many forms of cold hypersensitivity, including:   Cold urticaria. Red, itchy hives appear on the skin when the tissues begin to warm after being iced.   Cold erythema. This is a red, itchy rash caused by exposure to cold.   Cold hemoglobinuria. Red blood cells break down when the tissues begin to warm after being iced. The hemoglobin that carry oxygen are passed into the urine because they cannot combine with blood proteins fast enough.   Numbness or altered sensitivity in the area being iced.  If you have any of the following conditions, do not use ice until you have discussed cryotherapy with your caregiver:  Heart conditions, such as arrhythmia, angina, or chronic heart disease.   High blood pressure.   Healing wounds or open skin in the area being iced.   Current infections.   Rheumatoid arthritis.   Poor circulation.   Diabetes.  Ice slows the blood flow in the region it is applied. This is beneficial when trying to stop inflamed tissues from spreading irritating chemicals to  surrounding tissues. However, if you expose your skin to cold temperatures for too long or without the proper protection, you can damage your skin or nerves. Watch for signs of skin damage due to cold. HOME CARE INSTRUCTIONS Follow these tips to use ice and cold packs safely.  Place a dry or damp towel between the ice and skin. A damp towel will cool the skin more quickly, so you may need to shorten the time that the ice is used.   For a more rapid response, add gentle compression to the ice.   Ice for no more than 10 to 20 minutes at a time. The bonier the area you are icing, the less time it will take to get the benefits of ice.   Check your skin after 5 minutes to make sure there are no signs of a poor response to cold or skin damage.   Rest 20 minutes or more in between uses.   Once your skin is numb, you can end your treatment. You can test numbness by very lightly touching your skin. The touch should be so light that you do not see the skin dimple from the pressure of your fingertip. When using ice, most people will feel these normal sensations in this order: cold, burning, aching, and numbness.   Do not use ice on someone who cannot communicate their responses to pain, such as small children or people with dementia.  HOW TO MAKE AN ICE PACK Ice packs are the most common way to use ice therapy. Other methods include ice massage, ice baths, and cryo-sprays. Muscle creams that cause a cold, tingly feeling do not offer the same benefits that ice offers and should not be used as a substitute unless recommended by your caregiver. To make an ice pack, do one of the following:  Place crushed ice or a bag of frozen vegetables in a sealable plastic bag. Squeeze out the excess air. Place this bag inside another plastic bag. Slide the bag into a pillowcase or place a damp towel between your skin and the bag.   Mix 3 parts water with 1 part rubbing alcohol. Freeze the mixture in a sealable plastic  bag. When you remove the mixture from the freezer, it will be slushy. Squeeze out the excess air. Place this bag inside another plastic bag. Slide the bag  into a pillowcase or place a damp towel between your skin and the bag.  SEEK MEDICAL CARE IF:  You develop white spots on your skin. This may give the skin a blotchy (mottled) appearance.   Your skin turns blue or pale.   Your skin becomes waxy or hard.   Your swelling gets worse.  MAKE SURE YOU:   Understand these instructions.   Will watch your condition.   Will get help right away if you are not doing well or get worse.  Document Released: 11/20/2010 Document Revised: 03/15/2011 Document Reviewed: 11/20/2010 [facility] Patient Information 2012 [place], [place].Lumbosacral Strain Lumbosacral strain is one of the most common causes of back pain. There are many causes of back pain. Most are not serious conditions. CAUSES  Your backbone (spinal column) is made up of 24 main vertebral bodies, the sacrum, and the coccyx. These are held together by muscles and tough, fibrous tissue (ligaments). Nerve roots pass through the openings between the vertebrae. A sudden move or injury to the back may cause injury to, or pressure on, these nerves. This may result in localized back pain or pain movement (radiation) into the buttocks, down the leg, and into the foot. Sharp, shooting pain from the buttock down the back of the leg (sciatica) is frequently associated with a ruptured (herniated) disk. Pain may be caused by muscle spasm alone. Your caregiver can often find the cause of your pain by the details of your symptoms and an exam. In some cases, you may need tests (such as X-rays). Your caregiver will work with you to decide if any tests are needed based on your specific exam. HOME CARE INSTRUCTIONS   Avoid an underactive lifestyle. Active exercise, as directed by your caregiver, is your greatest weapon against back pain.   Avoid hard physical  activities (tennis, racquetball, waterskiing) if you are not in proper physical condition for it. This may aggravate or create problems.   If you have a back problem, avoid sports requiring sudden body movements. Swimming and walking are generally safer activities.   Maintain good posture.   Avoid becoming overweight (obese).   Use bed rest for only the most extreme, sudden (acute) episode. Your caregiver will help you determine how much bed rest is necessary.   For acute conditions, you may put ice on the injured area.   Put ice in a plastic bag.   Place a towel between your skin and the bag.   Leave the ice on for 15 to 20 minutes at a time, every 2 hours, or as needed.   After you are improved and more active, it may help to apply heat for 30 minutes before activities.  See your caregiver if you are having pain that lasts longer than expected. Your caregiver can advise appropriate exercises or therapy if needed. With conditioning, most back problems can be avoided. SEEK IMMEDIATE MEDICAL CARE IF:   You have numbness, tingling, weakness, or problems with the use of your arms or legs.   You experience severe back pain not relieved with medicines.   There is a change in bowel or bladder control.   You have increasing pain in any area of the body, including your belly (abdomen).   You notice shortness of breath, dizziness, or feel faint.   You feel sick to your stomach (nauseous), are throwing up (vomiting), or become sweaty.   You notice discoloration of your toes or legs, or your feet get very cold.   Your back  pain is getting worse.   You have a fever.  MAKE SURE YOU:   Understand these instructions.   Will watch your condition.   Will get help right away if you are not doing well or get worse.  Document Released: 01/03/2005 Document Revised: 03/15/2011 Document Reviewed: 06/25/2008 [facility] Patient Information 2012 [place], [place].  RESOURCE GUIDE  Dental  Problems  Patients with Medicaid: [facility] [phone] [address]                                           [phone] [address] Phone:  [phone]                                                  Phone:  [phone]  If unable to pay or uninsured, contact:  [facility] or [facility]. to become qualified for the adult dental clinic.  Chronic Pain Problems Contact [facility]  [phone] Patients need to be referred by their primary care doctor.  Insufficient Money for Medicine Contact [employer]:  call "211" or [facility] (704)482-6138.  No Primary Care Doctor Call [facility]  757-261-8236 Other agencies that provide inexpensive medical care    [facility]  [phone]    [facility]  [phone]    [facility]  [phone]    [facility]  [phone]    [facility]  3134405143    [facility]  [phone]  Psychological Services [facility]  [phone] [facility] Services  [phone] [facility]   5304964907 (emergency services [phone])  Substance Abuse Resources [facility]  213-717-4869 [facility] [phone] [facility] [phone] Floydene Flock 2051956804 Residential & Outpatient Substance Abuse Program  ([phone]  Abuse/Neglect [facility] Child Abuse Hotline (425)725-6841 [facility] Child Abuse Hotline 808-288-7106 (After Hours)  Emergency Shelter [facility] 9492753490  Maternity Homes [facility] 337-772-3554 [facility] [phone]  MRSA Hotline #:   (916)884-2460    [facility] Resources  [facility]     [employer]                          [facility] Dept. [address] [place]                       [address]      [address]    [place]                                                Cristobal Goldmann Phone:  [phone]  Phone:  [phone]                 Phone:  [phone]  [facility] Phone:  [phone]  [place] Child Abuse Hotline (336) 342-1394 [phone]) [phone] (After Hours)   

## 2011-07-30 LAB — URINE CULTURE

## 2011-09-18 ENCOUNTER — Emergency Department (HOSPITAL_COMMUNITY)
Admission: EM | Admit: 2011-09-18 | Discharge: 2011-09-18 | Disposition: A | Discharge status: Home / Self Care | Payer: BC Managed Care – PPO | Attending: Emergency Medicine | Admitting: Emergency Medicine

## 2011-09-18 ENCOUNTER — Encounter (HOSPITAL_COMMUNITY): Payer: Self-pay | Admitting: *Deleted

## 2011-09-18 DIAGNOSIS — E669 Obesity, unspecified: Secondary | ICD-10-CM | POA: Insufficient documentation

## 2011-09-18 DIAGNOSIS — R109 Unspecified abdominal pain: Secondary | ICD-10-CM | POA: Insufficient documentation

## 2011-09-18 DIAGNOSIS — R51 Headache: Secondary | ICD-10-CM | POA: Insufficient documentation

## 2011-09-18 LAB — COMPREHENSIVE METABOLIC PANEL
ALT: 29 U/L (ref 0–53)
AST: 28 U/L (ref 0–37)
Alkaline Phosphatase: 82 U/L (ref 39–117)
CO2: 24 mEq/L (ref 19–32)
Chloride: 104 mEq/L (ref 96–112)
GFR calc Af Amer: >90 mL/min (ref >90)
GFR calc non Af Amer: >90 mL/min (ref >90)
Glucose, Bld: 86 mg/dL (ref 70–99)
Potassium: 3.5 mEq/L (ref 3.5–5.1)
Sodium: 139 mEq/L (ref 135–145)

## 2011-09-18 LAB — CBC
MCV: 82.5 fL (ref 78.0–100.0)
Platelets: 209 10*3/uL (ref 150–400)
RBC: 5.3 MIL/uL (ref 4.22–5.81)
RDW: 13.2 % (ref 11.5–15.5)
WBC: 4.9 10*3/uL (ref 4.0–10.5)

## 2011-09-18 LAB — DIFFERENTIAL
Basophils Absolute: 0 10*3/uL (ref 0.0–0.1)
Lymphocytes Relative: 46 % (ref 12–46)
Lymphs Abs: 2.2 10*3/uL (ref 0.7–4.0)
Neutro Abs: 2 10*3/uL (ref 1.7–7.7)
Neutrophils Relative %: 40 % — ABNORMAL LOW (ref 43–77)

## 2011-09-18 LAB — GLUCOSE, CAPILLARY: Glucose-Capillary: 96 mg/dL (ref 70–99)

## 2011-09-18 LAB — URINALYSIS, ROUTINE W REFLEX MICROSCOPIC
Bilirubin Urine: NEGATIVE
Nitrite: NEGATIVE
Specific Gravity, Urine: 1.03 (ref 1.005–1.030)
pH: 6 (ref 5.0–8.0)

## 2011-09-18 MED ORDER — ONDANSETRON HCL 4 MG/2ML IJ SOLN
4.0000 mg | Freq: Once | INTRAMUSCULAR | Status: AC
  Administered 2011-09-18: 4 mg via INTRAVENOUS
  Filled 2011-09-18: qty 2

## 2011-09-18 MED ORDER — SODIUM CHLORIDE 0.9 % IV BOLUS (SEPSIS)
1000.0000 mL | Freq: Once | INTRAVENOUS | Status: AC
  Administered 2011-09-18: 1000 mL via INTRAVENOUS

## 2011-09-18 MED ORDER — KETOROLAC TROMETHAMINE 30 MG/ML IJ SOLN
30.0000 mg | Freq: Once | INTRAMUSCULAR | Status: AC
  Administered 2011-09-18: 30 mg via INTRAVENOUS
  Filled 2011-09-18: qty 1

## 2011-09-18 MED ORDER — HYDROCODONE-ACETAMINOPHEN 5-325 MG PO TABS: ORAL_TABLET | ORAL | Status: AC

## 2011-09-18 NOTE — ED Notes (Signed)
Pt c/o right side abd pain that started a few days ago, frequent urination for the past month, headache that started two days ago, denies any n/v/d, admits to generalized weakness

## 2011-09-18 NOTE — Discharge Instructions (Signed)
Headache Headaches are caused by many different problems. Most commonly, headache is caused by muscle tension from an injury, fatigue, or emotional upset. Excessive muscle contractions in the scalp and neck result in a headache that often feels like a tight band around the head. Tension headaches often have areas of tenderness over the scalp and the back of the neck. These headaches may last for hours, days, or longer, and some may contribute to migraines in those who have migraine problems. Migraines usually cause a throbbing headache, which is made worse by activity. Sometimes only one side of the head hurts. Nausea, vomiting, eye pain, and avoidance of food are common with migraines. Visual symptoms such as light sensitivity, blind spots, or flashing lights may also occur. Loud noises may worsen migraine headaches. Many factors may cause migraine headaches:  Emotional stress, lack of sleep, and menstrual periods.   Alcohol and some drugs (such as birth control pills).   Diet factors (fasting, caffeine, food preservatives, chocolate).   Environmental factors (weather changes, bright lights, odors, smoke).  Other causes of headaches include minor injuries to the head. Arthritis in the neck; problems with the jaw, eyes, ears, or nose are also causes of headaches. Allergies, drugs, alcohol, and exposure to smoke can also cause moderate headaches. Rebound headaches can occur if someone uses pain medications for a long period of time and then stops. Less commonly, blood vessel problems in the neck and brain (including stroke) can cause various types of headache. Treatment of headaches includes medicines for pain and relaxation. Ice packs or heat applied to the back of the head and neck help some people. Massaging the shoulders, neck and scalp are often very useful. Relaxation techniques and stretching can help prevent these headaches. Avoid alcohol and cigarette smoking as these tend to make headaches  worse. Please see your caregiver if your headache is not better in 2 days.  SEEK IMMEDIATE MEDICAL CARE IF:   You develop a high fever, chills, or repeated vomiting.   You faint or have difficulty with vision.   You develop unusual numbness or weakness of your arms or legs.   Relief of pain is inadequate with medication, or you develop severe pain.   You develop confusion, or neck stiffness.   You have a worsening of a headache or do not obtain relief.  Document Released: 03/26/2005 Document Revised: 03/15/2011 Document Reviewed: 09/19/2006 [facility] Patient Information 2012 [place], [place].    Return here tomorrow at 11:00 am for ultrasound of your abdomen.  Do not eat anything 12 hrs prior to your appt.

## 2011-09-18 NOTE — ED Notes (Signed)
Pt talking on the phone in the room. VS WNL. NAD noted at this time.

## 2011-09-19 ENCOUNTER — Ambulatory Visit (HOSPITAL_COMMUNITY)
Admit: 2011-09-19 | Discharge: 2011-09-19 | Disposition: A | Discharge status: Home / Self Care | Payer: BC Managed Care – PPO | Source: Ambulatory Visit | Attending: Emergency Medicine | Admitting: Emergency Medicine

## 2011-09-19 DIAGNOSIS — K7689 Other specified diseases of liver: Secondary | ICD-10-CM | POA: Insufficient documentation

## 2011-09-19 DIAGNOSIS — R1011 Right upper quadrant pain: Secondary | ICD-10-CM | POA: Insufficient documentation

## 2011-09-19 DIAGNOSIS — R9389 Abnormal findings on diagnostic imaging of other specified body structures: Secondary | ICD-10-CM | POA: Insufficient documentation

## 2011-09-21 NOTE — ED Provider Notes (Signed)
History     CSN: [id number]  Arrival date & time 09/18/11  7829   First MD Initiated Contact with Patient 09/18/11 [phone]      Chief Complaint  Patient presents with  . Abdominal Pain  . Urinary Frequency    (Consider location/radiation/quality/duration/timing/severity/associated sxs/prior treatment) Patient is a 36 y.o. male presenting with abdominal pain and frequency. The history is provided by the patient.  Abdominal Pain The primary symptoms of the illness include abdominal pain and dysuria. The primary symptoms of the illness do not include fever, fatigue, shortness of breath, nausea, vomiting, diarrhea, hematemesis, hematochezia, vaginal discharge or vaginal bleeding. The current episode started more than 2 days ago. The onset of the illness was gradual. The problem has not changed since onset. The abdominal pain began more than 2 days ago. The pain came on gradually. The abdominal pain has been unchanged since its onset. The abdominal pain is located in the RUQ. The abdominal pain does not radiate. The abdominal pain is relieved by nothing. The abdominal pain is exacerbated by urination.  The dysuria is associated with frequency and urgency. The dysuria is not associated with hematuria.  The patient states that she believes she is currently not pregnant. The patient has not had a change in bowel habit. Additional symptoms associated with the illness include urgency and frequency. Symptoms associated with the illness do not include chills, heartburn, constipation, hematuria or back pain. Significant associated medical issues do not include diabetes, sickle cell disease, gallstones or diverticulitis.  Urinary Frequency This is a chronic problem. The current episode started 1 to 4 weeks ago. The problem occurs constantly. The problem has been unchanged. Associated symptoms include abdominal pain, urinary symptoms and weakness. Pertinent negatives include no chills, fatigue, fever,  headaches, nausea, numbness, rash, sore throat or vomiting. He has tried nothing for the symptoms. The treatment provided no relief.    History reviewed. No pertinent past medical history.  History reviewed. No pertinent past surgical history.  No family history on file.  History  Substance Use Topics  . Smoking status: Never Smoker   . Smokeless tobacco: Not on file  . Alcohol Use: No      Review of Systems  Constitutional: Negative for fever, chills, activity change, appetite change and fatigue.  HENT: Negative for sore throat.   Respiratory: Negative for shortness of breath.   Gastrointestinal: Positive for abdominal pain. Negative for heartburn, nausea, vomiting, diarrhea, constipation, hematochezia and hematemesis.  Genitourinary: Positive for dysuria, urgency and frequency. Negative for hematuria, scrotal swelling, vaginal bleeding, vaginal discharge, difficulty urinating and testicular pain.  Musculoskeletal: Negative for back pain.  Skin: Negative for rash.  Neurological: Positive for weakness. Negative for dizziness, numbness and headaches.  All other systems reviewed and are negative.    Allergies  Review of patient's allergies indicates no known allergies.  Home Medications   Current Outpatient Rx  Name Route Sig Dispense Refill  . ASPIRIN 81 MG PO CHEW Oral Chew 1 tablet (81 mg total) by mouth daily. 14 tablet 0  . HYDROCODONE-ACETAMINOPHEN 5-325 MG PO TABS  Take one-two tabs po q 4-6 hrs prn pain 10 tablet 0    BP 125/78  Pulse 66  Temp 97.8 F (36.6 C)  Resp 20  Ht 5\' 11"  (1.803 m)  Wt 338 lb (153.316 kg)  BMI 47.14 kg/m2  SpO2 100%  Physical Exam  Nursing note and vitals reviewed. Constitutional: He is oriented to person, place, and time. He appears well-developed and well-nourished.  No distress.       obese  HENT:  Head: Normocephalic and atraumatic.  Mouth/Throat: Oropharynx is clear and moist.  Neck: Normal range of motion. Neck supple.    Cardiovascular: Normal rate, regular rhythm and normal heart sounds.   Pulmonary/Chest: Effort normal and breath sounds normal. No respiratory distress. He exhibits no tenderness.  Abdominal: Soft. He exhibits no distension and no mass. There is no tenderness. There is no rebound and no guarding.  Musculoskeletal: Normal range of motion. He exhibits no tenderness.  Lymphadenopathy:    He has no cervical adenopathy.  Neurological: He is alert and oriented to person, place, and time. He exhibits normal muscle tone. Coordination normal.  Skin: Skin is warm and dry.    ED Course  Procedures (including critical care time)  Results for orders placed during the hospital encounter of 09/18/11  URINALYSIS, ROUTINE W REFLEX MICROSCOPIC      Component Value Range   Color, Urine YELLOW  YELLOW   APPearance CLEAR  CLEAR   Specific Gravity, Urine 1.030  1.005 - 1.030   pH 6.0  5.0 - 8.0   Glucose, UA NEGATIVE  NEGATIVE mg/dL   Hgb urine dipstick NEGATIVE  NEGATIVE   Bilirubin Urine NEGATIVE  NEGATIVE   Ketones, ur NEGATIVE  NEGATIVE mg/dL   Protein, ur NEGATIVE  NEGATIVE mg/dL   Urobilinogen, UA 0.2  0.0 - 1.0 mg/dL   Nitrite NEGATIVE  NEGATIVE   Leukocytes, UA TRACE (*) NEGATIVE  GLUCOSE, CAPILLARY      Component Value Range   Glucose-Capillary 96  70 - 99 mg/dL  CBC      Component Value Range   WBC 4.9  4.0 - 10.5 K/uL   RBC 5.30  4.22 - 5.81 MIL/uL   Hemoglobin 14.3  13.0 - 17.0 g/dL   HCT 40.9  81.1 - 91.4 %   MCV 82.5  78.0 - 100.0 fL   MCH 27.0  26.0 - 34.0 pg   MCHC 32.7  30.0 - 36.0 g/dL   RDW 78.2  95.6 - 21.3 %   Platelets 209  150 - 400 K/uL  DIFFERENTIAL      Component Value Range   Neutrophils Relative 40 (*) 43 - 77 %   Neutro Abs 2.0  1.7 - 7.7 K/uL   Lymphocytes Relative 46  12 - 46 %   Lymphs Abs 2.2  0.7 - 4.0 K/uL   Monocytes Relative 10  3 - 12 %   Monocytes Absolute 0.5  0.1 - 1.0 K/uL   Eosinophils Relative 4  0 - 5 %   Eosinophils Absolute 0.2  0.0 - 0.7  K/uL   Basophils Relative 0  0 - 1 %   Basophils Absolute 0.0  0.0 - 0.1 K/uL  COMPREHENSIVE METABOLIC PANEL      Component Value Range   Sodium 139  135 - 145 mEq/L   Potassium 3.5  3.5 - 5.1 mEq/L   Chloride 104  96 - 112 mEq/L   CO2 24  19 - 32 mEq/L   Glucose, Bld 86  70 - 99 mg/dL   BUN 17  6 - 23 mg/dL   Creatinine, Ser 0.86  0.50 - 1.35 mg/dL   Calcium 9.5  8.4 - 57.8 mg/dL   Total Protein 7.3  6.0 - 8.3 g/dL   Albumin 3.7  3.5 - 5.2 g/dL   AST 28  0 - 37 U/L   ALT 29  0 - 53 U/L  Alkaline Phosphatase 82  39 - 117 U/L   Total Bilirubin 0.5  0.3 - 1.2 mg/dL   GFR calc non Af Amer >90  >90 mL/min   GFR calc Af Amer >90  >90 mL/min  URINE MICROSCOPIC-ADD ON      Component Value Range   Squamous Epithelial / LPF RARE  RARE   WBC, UA 0-2  <3 WBC/hpf  LIPASE, BLOOD      Component Value Range   Lipase 18  11 - 59 U/L      1. Abdominal pain   2. Headache       MDM    Patient is feeling better, requesting to go home.  Vitals stable.  Non-toxic appearing.  abd remains soft, NT on exam.  Doubtful of a surgical abdomen.  Pt agrees to return here if his sx's worsen.  Pt hx and care plan were discussed with EDP  The patient appears reasonably screened and/or stabilized for discharge and I doubt any other medical condition or other [facility] requiring further screening, evaluation, or treatment in the ED at this time prior to discharge.       Avenir Lozinski L. [place], [place] 09/21/11 2257

## 2011-10-02 NOTE — ED Provider Notes (Signed)
Medical screening examination/treatment/procedure(s) were performed by non-physician practitioner and as supervising physician I was immediately available for consultation/collaboration.   Benny Lennert, MD 10/02/11 [phone]

## 2011-11-30 ENCOUNTER — Emergency Department (HOSPITAL_COMMUNITY): Payer: BC Managed Care – PPO

## 2011-11-30 ENCOUNTER — Emergency Department (HOSPITAL_COMMUNITY)
Admission: EM | Admit: 2011-11-30 | Discharge: 2011-11-30 | Disposition: A | Discharge status: Home / Self Care | Payer: BC Managed Care – PPO | Attending: Emergency Medicine | Admitting: Emergency Medicine

## 2011-11-30 ENCOUNTER — Encounter (HOSPITAL_COMMUNITY): Payer: Self-pay | Admitting: *Deleted

## 2011-11-30 DIAGNOSIS — I252 Old myocardial infarction: Secondary | ICD-10-CM | POA: Insufficient documentation

## 2011-11-30 DIAGNOSIS — R079 Chest pain, unspecified: Secondary | ICD-10-CM

## 2011-11-30 DIAGNOSIS — I2510 Atherosclerotic heart disease of native coronary artery without angina pectoris: Secondary | ICD-10-CM | POA: Insufficient documentation

## 2011-11-30 DIAGNOSIS — M549 Dorsalgia, unspecified: Secondary | ICD-10-CM | POA: Insufficient documentation

## 2011-11-30 DIAGNOSIS — Z7982 Long term (current) use of aspirin: Secondary | ICD-10-CM | POA: Insufficient documentation

## 2011-11-30 DIAGNOSIS — R51 Headache: Secondary | ICD-10-CM | POA: Insufficient documentation

## 2011-11-30 LAB — CBC WITH DIFFERENTIAL/PLATELET
Eosinophils Relative: 5 % (ref 0–5)
HCT: 41.8 % (ref 39.0–52.0)
Hemoglobin: 14 g/dL (ref 13.0–17.0)
Lymphocytes Relative: 39 % (ref 12–46)
MCV: 82.4 fL (ref 78.0–100.0)
Monocytes Absolute: 0.5 10*3/uL (ref 0.1–1.0)
Monocytes Relative: 9 % (ref 3–12)
Neutro Abs: 2.7 10*3/uL (ref 1.7–7.7)
RDW: 13.3 % (ref 11.5–15.5)
WBC: 5.6 10*3/uL (ref 4.0–10.5)

## 2011-11-30 LAB — BASIC METABOLIC PANEL
BUN: 12 mg/dL (ref 6–23)
CO2: 23 mEq/L (ref 19–32)
Calcium: 9.2 mg/dL (ref 8.4–10.5)
Chloride: 107 mEq/L (ref 96–112)
Creatinine, Ser: 0.96 mg/dL (ref 0.50–1.35)
Glucose, Bld: 103 mg/dL — ABNORMAL HIGH (ref 70–99)

## 2011-11-30 LAB — TROPONIN I: Troponin I: <0.3 ng/mL (ref <0.30)

## 2011-11-30 MED ORDER — SODIUM CHLORIDE 0.9 % IV SOLN: INTRAVENOUS | Status: DC

## 2011-11-30 MED ORDER — SODIUM CHLORIDE 0.9 % IV BOLUS (SEPSIS)
250.0000 mL | Freq: Once | INTRAVENOUS | Status: AC
  Administered 2011-11-30: 1000 mL via INTRAVENOUS

## 2011-11-30 MED ORDER — HYDROCODONE-ACETAMINOPHEN 5-325 MG PO TABS: 1.0000 | ORAL_TABLET | Freq: Four times a day (QID) | ORAL | Status: DC | PRN

## 2011-11-30 MED ORDER — ONDANSETRON HCL 4 MG/2ML IJ SOLN
4.0000 mg | Freq: Once | INTRAMUSCULAR | Status: AC
  Administered 2011-11-30: 4 mg via INTRAVENOUS
  Filled 2011-11-30: qty 2

## 2011-11-30 MED ORDER — ASPIRIN 81 MG PO CHEW
324.0000 mg | CHEWABLE_TABLET | Freq: Once | ORAL | Status: AC
  Administered 2011-11-30: 324 mg via ORAL
  Filled 2011-11-30: qty 4

## 2011-11-30 MED ORDER — HYDROMORPHONE HCL PF 1 MG/ML IJ SOLN
1.0000 mg | Freq: Once | INTRAMUSCULAR | Status: AC
  Administered 2011-11-30: 1 mg via INTRAVENOUS
  Filled 2011-11-30: qty 1

## 2011-11-30 MED ORDER — NAPROXEN 500 MG PO TABS: 500.0000 mg | ORAL_TABLET | Freq: Two times a day (BID) | ORAL | Status: DC

## 2011-11-30 NOTE — ED Notes (Signed)
States that he started having chest pain about 2 days ago.  States that he had diarrhea about 1 week ago that lasted for 3-4 days, states it continues off and on.

## 2011-11-30 NOTE — ED Notes (Addendum)
Chest pain, sharp  , intermittent.  Feels like "no energy"  Pain lt knee and headache.

## 2011-11-30 NOTE — ED Notes (Signed)
Patient requests a work note and to be put on light duty because he has pain in his lower back.  States that his chest pain continues and it "comes and goes" at present.  Pt is a poor historian regarding his medical history.

## 2011-11-30 NOTE — ED Notes (Signed)
Dr. Zackowski at bedside  

## 2011-11-30 NOTE — ED Provider Notes (Signed)
History  This chart was scribed for Shelda Jakes, MD by Erskine Emery. This patient was seen in room APA04/APA04 and the patient's care was started at 17:00.   CSN: [id number]  Arrival date & time 11/30/11  1646   First MD Initiated Contact with Patient 11/30/11 1700      Chief Complaint  Patient presents with  . Chest Pain    (Consider location/radiation/quality/duration/timing/severity/associated sxs/prior treatment) The history is provided by the patient. No language interpreter was used.  Bobby Bridges is a 36 y.o. male who presents to the Emergency Department complaining of intermittent, sharp left chest pain of a 5/10 severity in episodes of 5-10 minutes, aggravated by lifting anything and sometimes walking since last night. Pt reports the pain is similar to the last time he was here for chest pain. Pt was seen here in June, April, and December and has never been admitted. Pt also reports some occasional SOB, a mild headache, urinary frequency, low back pain, and fatigue but denies any nausea, emesis, abdominal pain, fever, dysuria, abnormal swelling, or a h/o bleeding easily. Pt has a h/o CAD but no DM.    Pt has no PCP or cardiologist.   Past Medical History  Diagnosis Date  . Coronary artery disease   . Myocardial infarction     History reviewed. No pertinent past surgical history.  History reviewed. No pertinent family history.  History  Substance Use Topics  . Smoking status: Never Smoker   . Smokeless tobacco: Not on file  . Alcohol Use: No      Review of Systems  Constitutional: Positive for fatigue. Negative for fever and chills.  HENT: Negative for sore throat.   Eyes: Negative for photophobia.  Respiratory: Positive for shortness of breath.   Cardiovascular: Positive for chest pain.  Gastrointestinal: Negative for nausea and vomiting.  Genitourinary: Positive for frequency. Negative for dysuria.  Musculoskeletal: Positive for back pain.     Left knee pain  Skin: Negative for rash.  Neurological: Positive for headaches. Negative for weakness.  Psychiatric/Behavioral: Negative for confusion.  All other systems reviewed and are negative.    Allergies  Review of patient's allergies indicates no known allergies.  Home Medications   Current Outpatient Rx  Name Route Sig Dispense Refill  . ASPIRIN 81 MG PO CHEW Oral Chew 1 tablet (81 mg total) by mouth daily. 14 tablet 0    There were no vitals taken for this visit.  Physical Exam  Nursing note and vitals reviewed. Constitutional: He is oriented to person, place, and time. He appears well-developed and well-nourished. No distress.  HENT:  Head: Normocephalic and atraumatic.  Mouth/Throat: Oropharynx is clear and moist.  Eyes: EOM are normal.  Neck: Neck supple. No tracheal deviation present.  Cardiovascular: Normal rate, regular rhythm and normal heart sounds.   No murmur heard. Pulmonary/Chest: Effort normal and breath sounds normal. No respiratory distress. He has no wheezes.  Abdominal: Soft. Bowel sounds are normal. He exhibits no distension. There is no tenderness.  Musculoskeletal: Normal range of motion. He exhibits no edema.  Lymphadenopathy:    He has no cervical adenopathy.  Neurological: He is alert and oriented to person, place, and time. No cranial nerve deficit. Coordination normal.  Skin: Skin is warm and dry.  Psychiatric: He has a normal mood and affect.    ED Course  Procedures (including critical care time) DIAGNOSTIC STUDIES: Oxygen Saturation is 100% on room air, normal by my interpretation.    COORDINATION  OF CARE: 17:39--I evaluated the patient and we discussed a treatment plan including medications (Zofran, Dilaudid, and aspirin) to which the pt agreed.    Labs Reviewed - No data to display No results found.   Date: 11/30/2011  Rate: 86  Rhythm: normal sinus rhythm  QRS Axis: right  Intervals: normal  ST/T Wave abnormalities:  nonspecific ST changes  Conduction Disutrbances:none  Narrative Interpretation:   Old EKG Reviewed: unchanged From 03/16/11  No diagnosis found. Results for orders placed during the hospital encounter of 11/30/11  TROPONIN I      Component Value Range   Troponin I <0.30  <0.30 ng/mL  CBC WITH DIFFERENTIAL      Component Value Range   WBC 5.6  4.0 - 10.5 K/uL   RBC 5.07  4.22 - 5.81 MIL/uL   Hemoglobin 14.0  13.0 - 17.0 g/dL   HCT 40.9  81.1 - 91.4 %   MCV 82.4  78.0 - 100.0 fL   MCH 27.6  26.0 - 34.0 pg   MCHC 33.5  30.0 - 36.0 g/dL   RDW 78.2  95.6 - 21.3 %   Platelets 230  150 - 400 K/uL   Neutrophils Relative 48  43 - 77 %   Neutro Abs 2.7  1.7 - 7.7 K/uL   Lymphocytes Relative 39  12 - 46 %   Lymphs Abs 2.2  0.7 - 4.0 K/uL   Monocytes Relative 9  3 - 12 %   Monocytes Absolute 0.5  0.1 - 1.0 K/uL   Eosinophils Relative 5  0 - 5 %   Eosinophils Absolute 0.3  0.0 - 0.7 K/uL   Basophils Relative 1  0 - 1 %   Basophils Absolute 0.0  0.0 - 0.1 K/uL  BASIC METABOLIC PANEL      Component Value Range   Sodium 139  135 - 145 mEq/L   Potassium 3.6  3.5 - 5.1 mEq/L   Chloride 107  96 - 112 mEq/L   CO2 23  19 - 32 mEq/L   Glucose, Bld 103 (*) 70 - 99 mg/dL   BUN 12  6 - 23 mg/dL   Creatinine, Ser 0.86  0.50 - 1.35 mg/dL   Calcium 9.2  8.4 - 57.8 mg/dL   GFR calc non Af Amer >90  >90 mL/min   GFR calc Af Amer >90  >90 mL/min      MDM  Patient chest pain is most likely atypical. Intermittent has been going on for a while his head in the past. Today's workup negative troponin negative no acute changes on EKG chest x-ray without evidence of pneumonia or pneumothorax. Labs without specific abnormalities. Patient also talks about being tired a lot may be reasonable to have his thyroid checked resource guide provided to help him find a primary care Dr. for followup. Despite past history sounds like a never was any true history of coronary artery disease or myocardial  infarction.      I personally performed the services described in this documentation, which was scribed in my presence. The recorded information has been reviewed and considered.     Shelda Jakes, MD 11/30/11 [phone]

## 2011-11-30 NOTE — ED Notes (Signed)
Pt c/o left side chest pain that started last night, denies any n/v, pt states that the pain comes and goes, feels that he does not have any energy, headache and right knee pain.

## 2011-12-01 ENCOUNTER — Inpatient Hospital Stay (HOSPITAL_COMMUNITY)
Admission: RE | Admit: 2011-12-01 | Discharge: 2011-12-03 | DRG: 430 | Disposition: A | Discharge status: Home / Self Care | Payer: BC Managed Care – PPO | Source: Ambulatory Visit | Attending: Psychiatry | Admitting: Psychiatry

## 2011-12-01 ENCOUNTER — Emergency Department (HOSPITAL_COMMUNITY)
Admission: EM | Admit: 2011-12-01 | Discharge: 2011-12-01 | Disposition: A | Discharge status: Other Acute Inpatient Hospital | Payer: BC Managed Care – PPO | Attending: Emergency Medicine | Admitting: Emergency Medicine

## 2011-12-01 ENCOUNTER — Encounter (HOSPITAL_COMMUNITY): Payer: Self-pay

## 2011-12-01 DIAGNOSIS — F332 Major depressive disorder, recurrent severe without psychotic features: Principal | ICD-10-CM | POA: Diagnosis present

## 2011-12-01 DIAGNOSIS — I2510 Atherosclerotic heart disease of native coronary artery without angina pectoris: Secondary | ICD-10-CM | POA: Diagnosis present

## 2011-12-01 DIAGNOSIS — R45851 Suicidal ideations: Secondary | ICD-10-CM

## 2011-12-01 DIAGNOSIS — F29 Unspecified psychosis not due to a substance or known physiological condition: Secondary | ICD-10-CM

## 2011-12-01 DIAGNOSIS — F329 Major depressive disorder, single episode, unspecified: Secondary | ICD-10-CM | POA: Insufficient documentation

## 2011-12-01 DIAGNOSIS — F3289 Other specified depressive episodes: Secondary | ICD-10-CM | POA: Insufficient documentation

## 2011-12-01 DIAGNOSIS — F5105 Insomnia due to other mental disorder: Secondary | ICD-10-CM | POA: Diagnosis present

## 2011-12-01 DIAGNOSIS — I252 Old myocardial infarction: Secondary | ICD-10-CM

## 2011-12-01 DIAGNOSIS — F339 Major depressive disorder, recurrent, unspecified: Secondary | ICD-10-CM | POA: Diagnosis present

## 2011-12-01 DIAGNOSIS — F489 Nonpsychotic mental disorder, unspecified: Secondary | ICD-10-CM | POA: Diagnosis present

## 2011-12-01 LAB — RAPID URINE DRUG SCREEN, HOSP PERFORMED
Amphetamines: NOT DETECTED
Barbiturates: NOT DETECTED
Tetrahydrocannabinol: NOT DETECTED

## 2011-12-01 LAB — ETHANOL: Alcohol, Ethyl (B): <11 mg/dL (ref 0–11)

## 2011-12-01 MED ORDER — ALUM & MAG HYDROXIDE-SIMETH 200-200-20 MG/5ML PO SUSP: 30.0000 mL | ORAL | Status: DC | PRN

## 2011-12-01 MED ORDER — ACETAMINOPHEN 325 MG PO TABS: 650.0000 mg | ORAL_TABLET | Freq: Four times a day (QID) | ORAL | Status: DC | PRN

## 2011-12-01 MED ORDER — ACETAMINOPHEN 325 MG PO TABS
650.0000 mg | ORAL_TABLET | Freq: Four times a day (QID) | ORAL | Status: DC | PRN
  Administered 2011-12-02: 650 mg via ORAL

## 2011-12-01 MED ORDER — HYDROXYZINE HCL 25 MG PO TABS
25.0000 mg | ORAL_TABLET | Freq: Three times a day (TID) | ORAL | Status: DC | PRN
  Administered 2011-12-01: 25 mg via ORAL

## 2011-12-01 MED ORDER — MAGNESIUM HYDROXIDE 400 MG/5ML PO SUSP: 30.0000 mL | ORAL | Status: DC | PRN

## 2011-12-01 MED ORDER — CITALOPRAM HYDROBROMIDE 20 MG PO TABS
20.0000 mg | ORAL_TABLET | Freq: Every day | ORAL | Status: DC
  Administered 2011-12-01: 20 mg via ORAL
  Filled 2011-12-01 (×3): qty 1

## 2011-12-01 MED ORDER — MAGNESIUM HYDROXIDE 400 MG/5ML PO SUSP: 30.0000 mL | Freq: Every day | ORAL | Status: DC | PRN

## 2011-12-01 MED ORDER — CITALOPRAM HYDROBROMIDE 20 MG PO TABS
ORAL_TABLET | ORAL | Status: AC
  Filled 2011-12-01: qty 1

## 2011-12-01 MED ORDER — ARIPIPRAZOLE 5 MG PO TABS
5.0000 mg | ORAL_TABLET | Freq: Every day | ORAL | Status: DC
  Filled 2011-12-01 (×2): qty 1

## 2011-12-01 NOTE — ED Notes (Signed)
Security called to lock up patient's wallet and wand him.

## 2011-12-01 NOTE — ED Notes (Signed)
Pt to be transferred to [facility] 500 bed 2

## 2011-12-01 NOTE — ED Notes (Signed)
Pt c/o being suicidal but states he does not have a plan at this time. Pt states he has been feeling this way for a while, but has gotten worse since yesterday. Pt denies any homicidal thoughts at this time.

## 2011-12-01 NOTE — BH Assessment (Signed)
[facility] Assessment Progress Note    12/01/2011 Patient was accepted by Dr. Sheryle Spray to Dr. Dan Humphreys, room 500-bed 2.  Attempted to precert his insurance, which is [employer]. Recording stated there is no need to pre-cert and to call on the next business to notify of the admission. Contacted the number given on the face sheet (demographics) [phone].  Completed the admission paperwork. Explained to patient the carelink process and signing himself into [facility]. Patient calm and cooperative.   Shon Baton, MSW, LCSW, LCASA, CSW-G

## 2011-12-01 NOTE — ED Provider Notes (Signed)
History  This chart was scribed for Glynn Octave, MD by Shari Heritage. The patient was seen in room APA15/APA15. Patient's care was started at 1139.     CSN: [id number]  Arrival date & time 12/01/11  1139   First MD Initiated Contact with Patient 12/01/11 1220      Chief Complaint  Patient presents with  . V70.1    The history is provided by the patient. No language interpreter was used.   Bobby Bridges is a 36 y.o. male who presents to the Emergency Department complaining of suicidal ideation without a plan. He denies homicidal ideation. Patient states that he has been feeling anxious and depressed for the past several weeks. He states that he is going through a divorce process and he is separated from his wife. Patient has no children. He is currently employed. Patient also complains of chronic back pain. He denies fever or cough. No nausea or vomiting.  Patient was seen here yesterday by Dr. Deretha Emory complaining of intermittent episodes of sharp chest pain. He has experienced these chest pain episodes on and off for several years. He says that the episodes usually last a couple of hours.  Past Medical History  Diagnosis Date  . Coronary artery disease   . Myocardial infarction     No past surgical history on file.  No family history on file.  History  Substance Use Topics  . Smoking status: Never Smoker   . Smokeless tobacco: Not on file  . Alcohol Use: Yes      Review of Systems 10 Systems reviewed and all are negative for acute change except as noted in the HPI.   Allergies  Review of patient's allergies indicates no known allergies.  Home Medications   No current outpatient prescriptions on file.  BP 124/75  Pulse 72  Temp 97.5 F (36.4 C) (Oral)  Resp 18  Ht 5\' 11"  (1.803 m)  Wt 309 lb 2 oz (140.218 kg)  BMI 43.11 kg/m2  SpO2 100%  Physical Exam  Nursing note and vitals reviewed. Constitutional: He is oriented to person, place, and time.         Awake, alert, nontoxic appearance with baseline speech for patient.  HENT:  Head: Atraumatic.  Mouth/Throat: No oropharyngeal exudate.  Eyes: EOM are normal. Pupils are equal, round, and reactive to light. Right eye exhibits no discharge. Left eye exhibits no discharge.       Mild strabismus of right eye.  Neck: Neck supple.  Cardiovascular: Normal rate and regular rhythm.   No murmur heard. Pulmonary/Chest: Effort normal and breath sounds normal. No stridor. No respiratory distress. He has no wheezes. He has no rales. He exhibits tenderness (Mildly tender left chest wall).  Abdominal: Soft. Bowel sounds are normal. He exhibits no mass. There is no tenderness. There is no rebound.  Musculoskeletal: He exhibits no tenderness.       Baseline ROM, moves extremities with no obvious new focal weakness. Motor strength is 5/5 in all extremities.  Lymphadenopathy:    He has no cervical adenopathy.  Neurological: He is alert and oriented to person, place, and time.       Awake, alert, cooperative and aware of situation; motor strength bilaterally; sensation normal to light touch bilaterally; peripheral visual fields full to confrontation; no facial asymmetry; tongue midline; major cranial nerves appear intact; no pronator drift, normal finger to nose bilaterally, baseline gait without new ataxia. Motor strength is 5/5 in all extremities.  Skin: No rash  noted.  Psychiatric: He exhibits a depressed mood. He expresses suicidal ideation. He expresses no homicidal ideation. He expresses no suicidal plans and no homicidal plans.       Depressed with suicidal ideation without a plan. No homicidal ideation.    ED Course  Procedures (including critical care time)  COORDINATION OF CARE: 12:22pm- Patient / Family / Caregiver understand and agree with initial ED impression and plan with expectations set for ED visit.  Accepted at [facility], seen by Tele-psych recs admit, consult reveals psychosis in addition  to SI.   Labs Reviewed  URINE RAPID DRUG SCREEN (HOSP PERFORMED)  ETHANOL    Dg Chest 2 View  11/30/2011  *RADIOLOGY REPORT*  Clinical Data: 36 year old male with left chest pain.  CHEST - 2 VIEW  Comparison: 02/04/2004.  Findings: Stable scoliosis.  Stable cardiac size at the upper limits of normal to mildly enlarged, might in part reflect pectus deformity. Other mediastinal contours are within normal limits. Asymmetric right first rib costochondral calcifications are stable. No pneumothorax, pulmonary edema, pleural effusion or confluent pulmonary opacity.  IMPRESSION: No acute cardiopulmonary abnormality.  Cardiac size at the upper limits of normal to mildly enlarged since 2005.   Original Report Authenticated By: Harley Hallmark, M.D.      1. Suicidal ideation   2. Psychosis       MDM        I personally performed the services described in this documentation, which was scribed in my presence. The recorded information has been reviewed and considered.    Hurman Horn, MD 12/01/11 [phone]

## 2011-12-01 NOTE — [facility] Counselor (Signed)
Shon Baton, ACT counselor at [facility], submitted Pt for admission to [facility]. Consulted with Jacquelyne Balint, [facility] who confirmed bed availability. Gave clinical report to Dr. Mervyn Gay who accepted Pt to the service of Dr. Rueben Bash, room 500-2. Notified Shon Baton of disposition.  Harlin Rain Patsy [place], LPC

## 2011-12-01 NOTE — BH Assessment (Signed)
Assessment Note   Bobby Bridges is an 36 y.o. male. Patient reports that he has been feeling suicidal for "awhile." When asked for more specifics, he said for at least the past year, though symptoms have gotten much worse over the past month.  Patient reports that he has been depressed for some time. He states that he is feeling sad constantly and that he has frequent thoughts of "ending it." He says he has been cutting on himself, and he does have scars from where he has harmed himself. Patient states that he just doesn't feel he can go on and "I will find a way to end it all."  He reports this has all come to ahead because his wife has left him. Staff at the hospital say she is currently pregnant. Sister has brought him into the ED as she is very concerned that he is going to do something to himself. There is a prevalent history of other brother's and sister's harming themselves, apparently suicide attempts. Patient is very flat, he says he just feels like giving up, he wants it all to end. He says he just can't take it anymore. He denies current drug use, but did use THC until a month ago. He reports that he uses 1-2 beers a few days a week, however, sister told one of the RNs that he will get intoxicated and walk on the [place] tracks. 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 He says he is worried that he will lose his job. Explained FMLA to him. I refer patient to inpatient services as he cannot contract for safety. He currently denies HI, delusions and hallucinations, however, he did tell me not long ago, he saw the devil.   Axis I: Major Depression, single episode Axis II: Deferred Axis III: CAD, MI Axis IV: financial and relationship stressors Axis V: GAF 20   Past Medical History:  Past Medical History  Diagnosis Date  . Coronary artery disease   . Myocardial infarction     No past surgical history on file.  Family History: No family history on  file.  Social History:  reports that he has never smoked. He does not have any smokeless tobacco history on file. He reports that he drinks alcohol. He reports that he uses illicit drugs (Marijuana).  Additional Social History:     CIWA: CIWA-Ar BP: 127/75 mmHg Pulse Rate: 99  COWS:    Allergies: No Known Allergies  Home Medications:  (Not in a hospital admission)  OB/GYN Status:  No LMP for male patient.  General Assessment Data Location of Assessment: [facility] ACT Assessment: Yes Living Arrangements: Alone Can pt return to current living arrangement?: Yes Admission Status: Voluntary Is patient capable of signing voluntary admission?: Yes Transfer from: Acute Hospital Referral Source: MD  Education Status Is patient currently in school?: No  Risk to self Suicidal Ideation: Yes-Currently Present Suicidal Intent: Yes-Currently Present Is patient at risk for suicide?: Yes Suicidal Plan?: Yes-Currently Present Specify Current Suicidal Plan: "I'll find a way" has been cutting Access to Means: Yes Specify Access to Suicidal Means: knives What has been your use of drugs/alcohol within the last 12 months?:  (THC and ETOH) Previous Attempts/Gestures: Yes How many times?:  (1) Other Self Harm Risks:  (burn mark) Triggers for Past Attempts: Family contact;Other (Comment) Intentional Self Injurious Behavior: Cutting Comment - Self Injurious Behavior:  (says he has been cutting-does have marks) Family Suicide History: Yes Recent stressful life  event(s): Conflict (Comment);Divorce;Financial Problems Persecutory voices/beliefs?: No Depression: Yes Depression Symptoms: Isolating;Loss of interest in usual pleasures;Feeling worthless/self pity Substance abuse history and/or treatment for substance abuse?: Yes Suicide prevention information given to non-admitted patients: Not applicable  Risk to Others Homicidal Ideation: No Thoughts of Harm to Others: No Current Homicidal Intent:  No Current Homicidal Plan: No Access to Homicidal Means: No History of harm to others?: No Assessment of Violence: None Noted Does patient have access to weapons?: No Criminal Charges Pending?: No Does patient have a court date: No  Psychosis Hallucinations: None noted Delusions: None noted  Mental Status Report Appear/Hygiene: Disheveled Eye Contact: Fair Motor Activity: Unremarkable Speech: Logical/coherent;Soft Level of Consciousness: Alert Mood: Depressed Affect: Depressed Anxiety Level: Minimal Thought Processes: Relevant Judgement: Unimpaired Orientation: Person;Place;Time;Situation;Appropriate for developmental age Obsessive Compulsive Thoughts/Behaviors: Minimal  Cognitive Functioning Concentration: Decreased Memory: Recent Intact;Remote Intact IQ: Average Insight: Fair Impulse Control: Fair Appetite: Poor Weight Loss:  (20) Sleep: Decreased Total Hours of Sleep:  (3) Vegetative Symptoms: None  ADLScreening [facility] Assessment Services) Patient's cognitive ability adequate to safely complete daily activities?: Yes Patient able to express need for assistance with ADLs?: Yes Independently performs ADLs?: Yes (appropriate for developmental age)  Abuse/Neglect [facility]) Physical Abuse: Denies Verbal Abuse: Denies Sexual Abuse: Denies  Prior Inpatient Therapy Prior Inpatient Therapy: No  Prior Outpatient Therapy Prior Outpatient Therapy: No  ADL Screening (condition at time of admission) Patient's cognitive ability adequate to safely complete daily activities?: Yes Patient able to express need for assistance with ADLs?: Yes Independently performs ADLs?: Yes (appropriate for developmental age)       Abuse/Neglect Assessment (Assessment to be complete while patient is alone) Physical Abuse: Denies Verbal Abuse: Denies Sexual Abuse: Denies Values / Beliefs Cultural Requests During Hospitalization: None Spiritual Requests During Hospitalization: None         Additional Information 1:1 In Past 12 Months?: No CIRT Risk: No Elopement Risk: No Does patient have medical clearance?: Yes     Disposition:  Disposition Disposition of Patient: Inpatient treatment program;Referred to Type of inpatient treatment program: Adult Patient referred to: Other (Comment)  On Site Evaluation by:  Dr. Corine Shelter Reviewed with Physician:  Dr. Corine Shelter  Referring patient to [facility] for admission for treatment due to his depressive symptoms and suicidal ideation, with a plan.   Shon Baton H 12/01/2011 2:03 PM

## 2011-12-01 NOTE — ED Notes (Signed)
Pt accepted at [facility], will have a bed after 6pm

## 2011-12-02 ENCOUNTER — Encounter (HOSPITAL_COMMUNITY): Payer: Self-pay | Admitting: *Deleted

## 2011-12-02 DIAGNOSIS — F339 Major depressive disorder, recurrent, unspecified: Secondary | ICD-10-CM | POA: Diagnosis present

## 2011-12-02 MED ORDER — LISINOPRIL 10 MG PO TABS
10.0000 mg | ORAL_TABLET | Freq: Every day | ORAL | Status: DC
  Administered 2011-12-02 – 2011-12-03 (×2): 10 mg via ORAL
  Filled 2011-12-02 (×2): qty 1
  Filled 2011-12-02: qty 14
  Filled 2011-12-02: qty 1

## 2011-12-02 MED ORDER — CITALOPRAM HYDROBROMIDE 20 MG PO TABS
20.0000 mg | ORAL_TABLET | Freq: Every day | ORAL | Status: DC
  Administered 2011-12-02 – 2011-12-03 (×2): 20 mg via ORAL
  Filled 2011-12-02: qty 1
  Filled 2011-12-02: qty 14
  Filled 2011-12-02 (×2): qty 1

## 2011-12-02 NOTE — BHH Counselor (Signed)
Adult Comprehensive Assessment  Patient ID: Bobby Bridges, male   DOB: 16-Aug-1975, 36 y.o.   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  Housing / Lack of housing: Pt is homeless  Physical health (include injuries & life threatening diseases): N/A Social relationships: N/A  Substance abuse: N/A  Bereavement / Loss: N/A   Living/Environment/Situation:  Living Arrangements: Spouse/significant other Living conditions (as described by patient or guardian): Pt reports he is trying to find a place for him and his wife  How long has patient lived in current situation?: Pt reports for a while  What is atmosphere in current home: Chaotic  Family History:  Marital status: Married Number of Years Married: 0  (Since December 2012) What types of issues is patient dealing with in the relationship?: Pt reports living conditions have been stressful  Additional relationship information: N/A  Does patient have children?: No How many children?: 0  How is patient's relationship with their children?: Wife is currently pregnant with pts first child   Childhood History:  By whom was/is the patient raised?: Mother Additional childhood history information: Pt reports childhood was alright, no father figure  Description of patient's relationship with caregiver when they were a child: Pt reports relationship with mother was good Patient's description of current relationship with people who raised him/her: Both parents are deceased  Does patient have siblings?: Yes Number of Siblings: 6  Description of patient's current relationship with siblings: Pt reports  relationship is pretty good but doesn't have much contact  Did patient suffer any verbal/emotional/physical/sexual abuse as a child?: No Did patient suffer from severe childhood neglect?: No (Pt reports moving from home to home ) Has patient ever been sexually abused/assaulted/raped as an adolescent or adult?: No Was the patient ever a victim of a crime or a disaster?: No Witnessed domestic violence?: No Has patient been effected by domestic violence as an adult?: No  Education:  Highest grade of school patient has completed: Conservation officer, nature  Currently a Consulting civil engineer?: No Learning disability?: No  Employment/Work Situation:   Employment situation: Employed Where is patient currently employed?: [employer] How long has patient been employed?: 4 Patient's job has been impacted by current illness: No What is the longest time patient has a held a job?: 10 years Where was the patient employed at that time?: [employer]  Has patient ever been in the [employer]?: No Has patient ever served in combat?: No  Financial Resources:   Financial resources: Income from employment Does patient have a representative payee or guardian?: No  Alcohol/Substance Abuse:   What has been your use of drugs/alcohol within the last 12 months?: Pt reports drinking beer 2 months ago  If attempted suicide, did drugs/alcohol play a role in this?: No Alcohol/Substance Abuse Treatment Hx: Denies past history If yes, describe treatment: N/A  Has alcohol/substance abuse ever caused legal problems?: No  Social Support System:   Conservation officer, nature Support System: Fair Museum/gallery exhibitions officer System: Wife, family Type of faith/religion: Pt reports he believes in God  How does patient's faith help to cope with current illness?: Church, pray   Leisure/Recreation:   Leisure and Hobbies: Singing, bowling   Strengths/Needs:  What things does the patient do well?: Singing, bowling, people-person  In what areas does  patient struggle / problems for patient: Finding a better job, transportation, finding a place for his family   Discharge Plan:   Does patient have access to transportation?: No Plan for no access to transportation at discharge: Bus pass  Will patient be returning to same living situation after discharge?: Yes Currently receiving community mental health services: No If no, would patient like referral for services when discharged?: Yes (What county?) [facility] ) Does patient have financial barriers related to discharge medications?: Yes Patient description of barriers related to discharge medications: Pt only has financial resouces from [employer] job   Summary/Recommendations:   Summary and Recommendations (to be completed by the evaluator): Recommendations for treatment include crisis stabilization, case management, medication management, psychoeducation to teach coping skills, and group therapy.   Cassidi Long. 12/02/2011

## 2011-12-02 NOTE — Progress Notes (Signed)
[facility] Adult Inpatient Family/Significant Other Suicide Prevention Education  Suicide Prevention Education:  Education Completed; Bobby Bridges-sister-[phone]-Pt.'s sister- has been identified by the patient as the family member/significant other with whom the patient will be residing, and identified as the person(s) who will aid the patient in the event of a mental health crisis (suicidal ideations/suicide attempt).  With written consent from the patient, the family member/significant other has been provided the following suicide prevention education, prior to the and/or following the discharge of the patient.  The suicide prevention education provided includes the following:  Suicide risk factors  Suicide prevention and interventions  National Suicide Hotline telephone number  [facility] assessment telephone number  [facility] Emergency Assistance 911  [facility] and/or Residential Mobile Crisis Unit telephone number  Request made of family/significant other to:  Remove weapons (e.g., guns, rifles, knives), all items previously/currently identified as safety concern.  Pt.'s sister states that the pt. Does not have a gun  Remove drugs/medications (over-the-counter, prescriptions, illicit drugs), all items previously/currently identified as a safety concern. Pt.'s sister states that there is a family history of S attempts and pt. Has had prior attempts with trying to take too much medication. Pt.'s sister lives 10 minutes away and will go to home and secure home, but is trying to get guardianship over the pt. But pt. Is still legally married but wife left him. The pt. Married his second cousin. Pt.'s sister states that the pt.'s wife told the pt. That she is seeing someone else and that she is pregnant. Pt. Is unsure if the baby is his. Pt.'s sister states pt. Has a history of mental health problems and will not seek assistance on his own. Pt.'s sister states that  the pt. Needs long term treatment and want pt. t go to longer treatment for a mental health diagnosis. Pt.'s sister was told to call case manger on 12/02/11.  Pt.'s sister is supportive, but wants the pt. To be referral to [facility] located in  [place], [place]. For counseling. Pt.'s sister can be reached at the number above.   The family member/significant other verbalizes understanding of the suicide prevention education information provided.  The family member/significant other agrees to remove the items of safety concern listed above.  Bobby Bridges 12/02/2011, 4:17 PM

## 2011-12-02 NOTE — H&P (Signed)
Progress note was performed by physician extender and as a supervising MD, available for assistance near by during this rounds.  

## 2011-12-02 NOTE — Progress Notes (Signed)
Psychoeducational Group Note  Date:  12/02/2011 Time: 0930    Group Topic/Focus:  Spirituality:   The focus of this group is to discuss how one's spirituality can aide in recovery.  Participation Level:  Active  Participation Quality:  Appropriate  Affect:  Appropriate  Cognitive:  Appropriate  Insight:  Good  Engagement in Group:  Good  Additional Comments:    Meredith Staggers 12/02/2011, 11:30 AM

## 2011-12-02 NOTE — Progress Notes (Signed)
[facility] Group Notes:  (Counselor/Nursing/MHT/Case Management/Adjunct)  12/02/2011 5:36 PM  Type of Therapy:  Group Therapy  Participation Level:  Active  Participation Quality:  Appropriate and Attentive  Affect:  Appropriate  Cognitive:  Appropriate  Insight:  Limited  Engagement in Group:  Good  Engagement in Therapy:  Good  Modes of Intervention:  Clarification, Education, Socialization and Support  Summary of Progress/Problems: Pt. participated in group on health support systems and how  they(patients) can support themselves when their supports are not around. The patients also shared what the word support " meant to them".  Each pt. Also shared who they had as a support in their life and participated in a support activity and what it me and to actually feel support from someone. The pt. Stated that support meant to him when someone has your back. Pt. Stated his family was his support and a friend.  Neila Gear 12/02/2011, 5:36 PM

## 2011-12-02 NOTE — Tx Team (Signed)
Initial Interdisciplinary Treatment Plan  PATIENT STRENGTHS: (choose at least two) General fund of knowledge Motivation for treatment/growth Work skills  PATIENT STRESSORS: Financial difficulties Marital or family conflict   PROBLEM LIST: Problem List/Patient Goals Date to be addressed Date deferred Reason deferred Estimated date of resolution  Depressed with suicidal ideation 12/01/11                                                      DISCHARGE CRITERIA:  Ability to meet basic life and health needs Improved stabilization in mood, thinking, and/or behavior Verbal commitment to aftercare and medication compliance  PRELIMINARY DISCHARGE PLAN: Participate in family therapy Return to previous living arrangement Return to previous work or school arrangements  PATIENT/FAMIILY INVOLVEMENT: This treatment plan has been presented to and reviewed with the patient, Bobby Bridges, Bobby Bridges 12/02/2011, 2:45 AM

## 2011-12-02 NOTE — Progress Notes (Signed)
Report received from [facility] RN. Writer entered patient room and observed him lying in bed awake. Writer infomed patient of orders received and patient requested a prn dose of visteril for insomnia. Patient informed that he would be seen on tomorrow by a Dr/NP who would assess him and start medications if needed. Patient voiced no complaints, offered support and encouragement, safety maintained on unit, will continue to monitor.
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
## 2011-12-02 NOTE — Progress Notes (Signed)
Patient ID: Bobby Bridges, male   DOB: Jan 24, 1976, 36 y.o.   MRN: [id number] This is a 36 y.o. M/AA/M vol. admission with a Dx of M.D.D. The patient presents with a depressed mood and flat affect. His speech is soft and slow. States he is depressed and has had suicidal thoughts without a plan. He identifies as his stressors financial problems and marital problems. He and his wife have been married a little over a year and recently found out she was pregnant. They are both overwhelmed by the pregnancy and have been fighting a lot. He feels that she is expecting more emotional and financial support from him than what he is able to provide. 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 He does not own a car and walks a mile to work. His wife is unemployed. States that he wants to be a better husband and father to this expected child, but he doesn't know how as he grew up without a role model. He is the youngest child in his family and his father died when he was very young. His mother died a few years ago and he feels he has been lost since her death. The patient was in therapy briefly after she died due to a superficial suicide attempt of stabbing his arm. He was not prescribed any medications. Medical hx includes what the patient describes as a mild heart attack a few months ago. He is obese and is not on any prescribed medications or diet. The patient states that he hears a voice in his head that tells him to commit suicide. He is able to contract for safety at this time.

## 2011-12-02 NOTE — Progress Notes (Signed)
D) Pt attending the groups and interacting with select peers. Denies SI and HI. Remains quiet and withdrawn.  A) Given support and reassurance and praise. R). Denies Si and HI.

## 2011-12-03 DIAGNOSIS — F332 Major depressive disorder, recurrent severe without psychotic features: Principal | ICD-10-CM

## 2011-12-03 DIAGNOSIS — F489 Nonpsychotic mental disorder, unspecified: Secondary | ICD-10-CM

## 2011-12-03 MED ORDER — LISINOPRIL 10 MG PO TABS: 10.0000 mg | ORAL_TABLET | Freq: Every day | ORAL | Status: DC

## 2011-12-03 MED ORDER — CITALOPRAM HYDROBROMIDE 20 MG PO TABS: 20.0000 mg | ORAL_TABLET | Freq: Every day | ORAL | Status: DC

## 2011-12-03 NOTE — Tx Team (Signed)
Interdisciplinary Treatment Plan Update (Adult)  Date:  12/03/2011  Time Reviewed:  1:01 PM   Progress in Treatment: Attending groups: Yes Participating in groups:  Yes Taking medication as prescribed:  Yes Tolerating medication: Yes Family/Significant othe contact made:  Yes, contact made with: Bobby Bridges, sister Patient understands diagnosis: Yes Discussing patient identified problems/goals with staff:  Yes Medical problems stabilized or resolved: Yes Denies suicidal/homicidal ideation: Yes Issues/concerns per patient self-inventory:  No  Other:  New problem(s) identified: None  Reason for Continuation of Hospitalization: Appropriate for discharge today  Interventions implemented related to continuation of hospitalization:  Medication stabilization, safety checks q 15 mins, group attendance  Additional comments:  Estimated length of stay: discharge today  Discharge Plan: Bobby Bridges will discharge home and follow up with Dr. Lolly Mustache in [facility] goal(s):  Review of initial/current patient goals per problem list:   1.  Goal(s): Decrease depressive symptoms to a rating of 4 or less  Met:  Yes  Target date: by discharge  As evidenced by: Bobby Bridges rates depression at a 1 today  2.  Goal (s): Reduce potential for suicide or other self-harm behaviors  Met:  Yes  Target date: by discharge  As evidenced by: Bobby Bridges reports he has no thoughts of self-harm or death today  3.  Goal(s): Medication stabilization  Met:  Yes  Target date: by discharge  As evidenced by: Bobby Bridges reports that his medications have worked to decrease his depressive symptoms and that he has not experienced intolerable side effects.    Attendees: Patient:  Bobby Bridges 12/03/2011 1:01 PM  Family:     Physician:  Dr Orson Aloe, MD 12/03/2011 1:01 PM  Nursing:   Quintella Reichert, RN 12/03/2011 1:01 PM  Case Manager:  Juline Patch, LCSW 12/03/2011 1:01 PM  Counselor:  Angus Palms, LCSW 12/03/2011 1:01 PM  Other:  Reyes Ivan, LCSWA 12/03/2011 1:01 PM  Other:  Pixie Casino, RN 12/03/2011 1:01 PM  Other:     Other:      Scribe for Treatment Team:   Billie Lade, 12/03/2011 1:01 PM

## 2011-12-03 NOTE — Progress Notes (Signed)
Met with pt 1:1. He reports feeling slightly better today and denies any SI. States he was able to speak with his wife and talks about the upcoming birth of his son in March. States he is focused on finding adequate housing for him and his wife upon discharge. His perception seems to be in direct conflict with sister's report to counselor today (see note). Pt offered vistaril to aid with sleep but denies need for it. He attended 500 wrap up group tonight, denies HI/AVH/pain or problems. He remains safe and is presently sleeping. Lawrence Marseilles

## 2011-12-03 NOTE — Progress Notes (Signed)
[facility] Group Notes: (Counselor/Nursing/MHT/Case Management/Adjunct) 12/03/2011   @  1:15-2:30pm Overcoming Obstacles to Wellness   Type of Therapy:  Group Therapy  Participation Level:  None  Participation Quality: None   Affect:  Blutned  Cognitive:  Appropriate  Insight:  None  Engagement in Group: None  Engagement in Therapy:  None  Modes of Intervention:  Support and Exploration  Summary of Progress/Problems: Zylan  was attentive but not engaged in group process    Angus Palms, LCSW 12/03/2011  4:23 PM

## 2011-12-03 NOTE — Progress Notes (Signed)
[facility] Case Management Discharge Plan:  Will you be returning to the same living situation after discharge: Yes,  Patient return to his home At discharge, do you have transportation home?:Yes,  Patient assisted with taxi voucher Do you have the ability to pay for your medications:Yes,  Patient has private insurance  Interagency Information:     Release of information consent forms completed and in the chart;  Patient's signature needed at discharge.  Patient to Follow up at:  Follow-up Information    [facility] on 12[facility]18/2011. (Your appointment is scheduled for 11:00 on Tuesday, December 18, 2011.   Please [phone] regarding registration forms.)          Patient denies SI/HI:   Yes,  Patient denies SI/HI or thoughts of self harm    Safety Planning and Suicide Prevention discussed:  Yes.  Reviewed during aftercare group  Barrier to discharge identified:Yes,  Problems with landlord and financial  Summary and Recommendations:Patient encourage to be compliant with medications and follow up with outpatient recommedations   Bobby Bridges, [place] July 12/03/2011, 5:03 PM

## 2011-12-03 NOTE — [facility] Suicide Risk Assessment (Signed)
Suicide Risk Assessment  Discharge Assessment     Demographic factors:  Adolescent or young adult;Low socioeconomic status    Current Mental Status Per Nursing Assessment::   On Admission:  Suicidal ideation indicated by patient At Discharge:     Current Mental Status Per Physician: Patient denies suicidal or homicidal ideation, hallucinations, illusions, or delusions. Patient engages with good eye contact, is able to focus adequately in a one to one setting, and has clear goal directed thoughts. Patient speaks with a natural conversational volume, rate, and tone. Anxiety was reported at 1 on a scale of 1 the least and 10 the most. Depression was reported at 1 on the same scale. Patient is oriented times 4, recent and remote memory intact. 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 Coronary artery disease   . Myocardial infarction    AXIS IV:  other psychosocial or environmental problems AXIS V:  51-60 moderate symptoms  Cognitive Features That Contribute To Risk:  Closed-mindedness Thought constriction (tunnel vision)    Suicide Risk:  Minimal: No identifiable suicidal ideation.  Patients presenting with no risk factors but with morbid ruminations; may be classified as minimal risk based on the severity of the depressive symptoms  Labs: Results for orders placed during the hospital encounter of 12/01/11 (from the past 72 hour(s))  ETHANOL     Status: Normal   Collection Time   12/01/11 12:33 PM      Component Value Range Comment   Alcohol, Ethyl (B) <11   0 - 11 mg/dL   URINE RAPID DRUG SCREEN (HOSP PERFORMED)     Status: Normal   Collection Time   12/01/11 12:59 PM      Component Value Range Comment   Opiates NONE DETECTED  NONE DETECTED    Cocaine NONE DETECTED  NONE DETECTED    Benzodiazepines NONE DETECTED  NONE DETECTED    Amphetamines NONE DETECTED  NONE DETECTED    Tetrahydrocannabinol NONE DETECTED  NONE DETECTED    Barbiturates NONE DETECTED  NONE DETECTED     RISK REDUCTION FACTORS: What pt has learned from hospital stay is how to cope with peers and how to do the right thing.  Risk of self harm is elevated by his depression and his chronic medical conditions  Risk of harm to others is minimal in that he has not been involved in fights or had any legal charges filed on him.  Pt seen in treatment team where he divulged the above information. The treatment team concluded that he was ready for discharge and had met his goals for an inpatient setting.  PLAN: Discharge home Continue Medication List  As of 12/03/2011  3:00 PM   TAKE these medications      Indication    citalopram 20 MG tablet   Commonly known as: CELEXA   Take 1 tablet (20 mg total) by mouth daily. For depression.       lisinopril 10 MG tablet   Commonly known as: PRINIVIL,ZESTRIL   Take 1 tablet (10 mg total) by mouth daily. For control of high blood pressure  Follow-up recommendations:  Activities: Resume typical activities Diet: Resume typical diet Tests: none Other: Follow up with outpatient provider and report any side effects to out patient prescriber.  Tabathia Knoche 12/03/2011, 2:58 PM

## 2011-12-03 NOTE — Progress Notes (Signed)
D: Patient calm and cooperative no complaints.  Patient states he is ready to go home but concerned where he,his wife and unborn child will live.  Patient states they will live with a family member.  Patient states his depression is better and rates hopelessness 3/10 because he is unsure about housing.  Patient denies SI/HI and denies AVH. A: Staff to monitor Q 15 mins for safety.  Patient offered encouragement and support.   R: Patient remains safe on the unit.  Patient attended rec therapy this afternoon and went to dinner.

## 2011-12-03 NOTE — Discharge Summary (Signed)
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  Coronary artery disease    .  Myocardial infarction     AXIS IV: other psychosocial or environmental problems  AXIS V: 51-60 moderate symptoms   Level of Care:  OP  Hospital Course:   This is a 36 year old African-american Male, admitted from the [facility] hospital with increased depression with suicidal thoughts. Patient reports, "I went to the [facility] ED with my wife 2 days ago. I was having suicidal thoughts. It has been going on with worsened depression x 2 months. My wife and I are going through some tough times. But it got to a point where it becomes intolerable. My wife is pregnant with our first baby. I am happy that the baby is coming but I don't know if I know how to be a good father. I need a role model, someone who can teach me and show me how to be a role model. I never grew up with my father. Also my job is part of my stressors. 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 But my supervisor is always on my case. It does not matter what I tried to do and how I tried to do it. The place that I am living with my wife also is contributing to my depression. I live at the back of the house behind my landlord. I feel closed and caged in. I need to find a new apartment for me and my wife. I have been depressed off and on since 2000. That was the year that my mother died. I have not been on any medications and I need to take some medicine to help me deal  with my mood because I want to be a better man for my wife and my unborn child when he or she gets here. I had attempted suicide in the past. I stabbed my self on the left wrist with a blade. I did not have any plans to hurt myself this time".  While a patient in this hospital, [name] received medication management for depression. They were ordered and received Celexa for that. They were also enrolled in group counseling sessions and activities in which they participated actively.   Patient attended treatment team meeting this am and met with treatment team members. Pt symptoms, treatment plan and response to treatment discussed. [name] endorsed that their symptoms have improved. Pt also stated that they are stable for discharge.  They reported that from this hospital stay they had learned was how to cope with peers and how to do the right thing.  In other to maintain their mood, they will continue psychiatric care on outpatient basis. They will follow-up at [facility] on 9/10 with Dr Lolly Mustache at 1100.  In addition they were instructed to take all your medications as prescribed by your mental healthcare provider, to report any adverse effects and or reactions from your medicines to your outpatient provider promptly,  patient is instructed and cautioned to not engage in alcohol and or illegal drug use while on prescription medicines, in the event of worsening symptoms, patient is instructed to call the crisis hotline, 911 and or go to the nearest ED for appropriate evaluation and treatment of symptoms.   Upon discharge, patient adamantly denies suicidal, homicidal ideations, auditory, visual hallucinations and or delusional thinking. They left [facility] with all personal belongings via personal transportation in no apparent distress.  Consults:  None  Significant Diagnostic Studies:  labs: BMET, CBC, BAL, and UDS non contributory  Discharge Vitals:   Blood pressure  104/73, pulse 80, temperature 98.7 F (37.1 C), temperature source Oral, resp. rate 22, height 5\' 10"  (1.778 m), weight 139.396 kg (307 lb 5 oz)..  Mental Status Exam: See Mental Status Examination and Suicide Risk Assessment completed by Attending Physician prior to discharge.  Discharge destination:  Home  Is patient on multiple antipsychotic therapies at discharge:  No  Has Patient had three or more failed trials of antipsychotic monotherapy by history: N/A Recommended Plan for Multiple Antipsychotic Therapies: N/A  Medication List  As of 12/03/2011  3:03 PM   TAKE these medications      Indication    citalopram 20 MG tablet   Commonly known as: CELEXA   Take 1 tablet (20 mg total) by mouth daily. For depression.       lisinopril 10 MG tablet   Commonly known as: PRINIVIL,ZESTRIL   Take 1 tablet (10 mg total) by mouth daily. For control of high blood pressure            Follow-up Information    Follow up with [facility] on 12/18/2011. (Your appointment is scheduled for 11:00 on Tuesday, December 18, 2011.   Please [phone] regarding registration forms.)         Follow-up recommendations:   Activities: Resume typical activities Diet: Resume typical diet Tests: none Other: Follow up with outpatient provider and report any side effects to out patient prescriber.  Comments:  Take all your medications as prescribed by your mental healthcare provider. Report any adverse effects and or reactions from your medicines to your outpatient provider promptly. Patient is instructed and cautioned to not engage in alcohol and or illegal drug use while on prescription medicines. In the event of worsening symptoms, patient is instructed to call the crisis hotline, 911 and or go to the nearest ED for appropriate evaluation and treatment of symptoms.  SignedDan Humphreys, Desirea Mizrahi 12/03/2011 3:03 PM

## 2011-12-03 NOTE — Progress Notes (Signed)
Pt. Denies SI/HI.  Pt. Belongings returned to pt.  Supply of medications given.  Pt. Discharged and escorted from unit by Clinical research associate.  Encouragement and support given.  Pt. Receptive.

## 2011-12-03 NOTE — Progress Notes (Signed)
Psychoeducational Group Note  Date:  12/03/2011 Time:  1100  Group Topic/Focus:  Wellness Toolbox:   The focus of this group is to discuss various aspects of wellness, balancing those aspects and exploring ways to increase the ability to experience wellness.  Patients will create a wellness toolbox for use upon discharge.  Participation Level:  Minimal  Participation Quality:  Appropriate, Sharing and Supportive  Affect:  Appropriate and Flat  Cognitive:  Appropriate  Insight:  Good  Engagement in Group:  Good  Additional Comments:  none  Zakira Ressel M 12/03/2011, 11:45 AM

## 2011-12-04 NOTE — Progress Notes (Signed)
Patient Discharge Instructions:  After Visit Summary (AVS):   Access to EMR:  12/04/2011 Psychiatric Admission Assessment Note:   Access to EMR:  12/04/2011 Suicide Risk Assessment - Discharge Assessment:   Access to EMR:  12/04/2011 Next Level Care Provider Has Access to the EMR, 12/04/2011  Records provided to [facility] Dr. Lolly Mustache via [facility] access.  Wandra Scot, 12/04/2011, 5:40 PM

## 2011-12-18 ENCOUNTER — Ambulatory Visit (HOSPITAL_COMMUNITY): Payer: BC Managed Care – PPO | Admitting: Psychiatry

## 2012-01-15 ENCOUNTER — Emergency Department (HOSPITAL_COMMUNITY): Payer: BC Managed Care – PPO

## 2012-01-15 ENCOUNTER — Emergency Department (HOSPITAL_COMMUNITY)
Admission: EM | Admit: 2012-01-15 | Discharge: 2012-01-15 | Disposition: A | Discharge status: Home / Self Care | Payer: BC Managed Care – PPO | Attending: Emergency Medicine | Admitting: Emergency Medicine

## 2012-01-15 ENCOUNTER — Encounter (HOSPITAL_COMMUNITY): Payer: Self-pay | Admitting: *Deleted

## 2012-01-15 DIAGNOSIS — R079 Chest pain, unspecified: Secondary | ICD-10-CM | POA: Insufficient documentation

## 2012-01-15 DIAGNOSIS — I2510 Atherosclerotic heart disease of native coronary artery without angina pectoris: Secondary | ICD-10-CM | POA: Insufficient documentation

## 2012-01-15 DIAGNOSIS — M549 Dorsalgia, unspecified: Secondary | ICD-10-CM | POA: Insufficient documentation

## 2012-01-15 DIAGNOSIS — I252 Old myocardial infarction: Secondary | ICD-10-CM | POA: Insufficient documentation

## 2012-01-15 LAB — CBC WITH DIFFERENTIAL/PLATELET
Basophils Absolute: 0 10*3/uL (ref 0.0–0.1)
Lymphocytes Relative: 46 % (ref 12–46)
Lymphs Abs: 2.2 10*3/uL (ref 0.7–4.0)
Neutro Abs: 2 10*3/uL (ref 1.7–7.7)
Platelets: 245 10*3/uL (ref 150–400)
RBC: 5.14 MIL/uL (ref 4.22–5.81)
RDW: 13.1 % (ref 11.5–15.5)
WBC: 4.8 10*3/uL (ref 4.0–10.5)

## 2012-01-15 LAB — COMPREHENSIVE METABOLIC PANEL
ALT: 27 U/L (ref 0–53)
AST: 25 U/L (ref 0–37)
Alkaline Phosphatase: 83 U/L (ref 39–117)
CO2: 27 mEq/L (ref 19–32)
Chloride: 105 mEq/L (ref 96–112)
GFR calc non Af Amer: 86 mL/min — ABNORMAL LOW (ref >90)
Glucose, Bld: 93 mg/dL (ref 70–99)
Potassium: 3.6 mEq/L (ref 3.5–5.1)
Sodium: 140 mEq/L (ref 135–145)
Total Bilirubin: 0.3 mg/dL (ref 0.3–1.2)

## 2012-01-15 MED ORDER — KETOROLAC TROMETHAMINE 60 MG/2ML IM SOLN
60.0000 mg | Freq: Once | INTRAMUSCULAR | Status: AC
  Administered 2012-01-15: 60 mg via INTRAMUSCULAR
  Filled 2012-01-15: qty 2

## 2012-01-15 MED ORDER — GI COCKTAIL ~~LOC~~
30.0000 mL | Freq: Once | ORAL | Status: AC
  Administered 2012-01-15: 30 mL via ORAL
  Filled 2012-01-15: qty 30

## 2012-01-15 MED ORDER — IOHEXOL 350 MG/ML SOLN
150.0000 mL | Freq: Once | INTRAVENOUS | Status: AC | PRN
  Administered 2012-01-15: 120 mL via INTRAVENOUS

## 2012-01-15 MED ORDER — OMEPRAZOLE 20 MG PO CPDR: 20.0000 mg | DELAYED_RELEASE_CAPSULE | Freq: Every day | ORAL | Status: DC

## 2012-01-15 NOTE — ED Notes (Signed)
Chest , back pain , feels tired, says he is losing wt.  No NVD. Alert,

## 2012-01-15 NOTE — ED Notes (Signed)
Discharge instructions given and reviewed with patient.  Prescription given for Prilosec; effects and use explained.  Patient verbalized understanding to take medication as directed and to follow up with his PMD.  Patient ambulatory; discharged home in good condition.

## 2012-01-15 NOTE — ED Provider Notes (Signed)
History     CSN: [id number]  Arrival date & time 01/15/12  1647   First MD Initiated Contact with Patient 01/15/12 1700      Chief Complaint  Patient presents with  . Chest Pain    (Consider location/radiation/quality/duration/timing/severity/associated sxs/prior treatment) HPI Comments: Patient complains of intermittent left-sided chest pain for the past several weeks. It lasts 5-30 minutes at a time it is not radiate. There is no shortness of breath, nausea or diaphoresis. The pain comes on usually when he is moving his arms and lifting boxes. He has had multiple previous visits for the same type of chest pain most recently in August. He denies any cardiac history. However his chart states history of CAD and MI. Patient states he's never seen a cardiologist and an ER doctor told at some point that he had a small heart attack. He denies any cocaine abuse. He denies any pain or swelling in his legs. He also endorses bilateral low back pain that has been intermittent for several weeks as well. No bowel or bladder incontinence, fever, chills, vomiting.  The history is provided by the patient.    Past Medical History  Diagnosis Date  . Coronary artery disease   . Myocardial infarction     History reviewed. No pertinent past surgical history.  History reviewed. No pertinent family history.  History  Substance Use Topics  . Smoking status: Never Smoker   . Smokeless tobacco: Not on file  . Alcohol Use: Yes      Review of Systems  Constitutional: Negative for fever, activity change and appetite change.  HENT: Negative for congestion and rhinorrhea.   Respiratory: Positive for chest tightness. Negative for cough and shortness of breath.   Cardiovascular: Positive for chest pain.  Gastrointestinal: Negative for nausea, vomiting and abdominal pain.  Genitourinary: Negative for dysuria, hematuria and testicular pain.  Musculoskeletal: Positive for back pain.  Skin: Negative for  rash.  Neurological: Negative for dizziness, weakness and headaches.    Allergies  Review of patient's allergies indicates no known allergies.  Home Medications   Current Outpatient Rx  Name Route Sig Dispense Refill  . OMEPRAZOLE 20 MG PO CPDR Oral Take 1 capsule (20 mg total) by mouth daily. 30 capsule 0    BP 129/81  Pulse 65  Temp 97.8 F (36.6 C) (Oral)  Resp 15  Wt 300 lb (136.079 kg)  SpO2 99%  Physical Exam  Constitutional: He is oriented to person, place, and time. He appears well-developed and well-nourished. No distress.       Morbidly obese  HENT:  Head: Normocephalic and atraumatic.  Mouth/Throat: Oropharynx is clear and moist. No oropharyngeal exudate.  Eyes: Conjunctivae normal are normal. Pupils are equal, round, and reactive to light.  Neck: Normal range of motion. Neck supple.  Cardiovascular: Normal rate, regular rhythm and normal heart sounds.   No murmur heard.      +2 femoral, DP, PT pulses  Pulmonary/Chest: Effort normal and breath sounds normal. No respiratory distress. He exhibits tenderness.  Abdominal: Soft. There is no tenderness. There is no rebound.  Musculoskeletal: Normal range of motion. He exhibits no edema and no tenderness.  Neurological: He is alert and oriented to person, place, and time.  Skin: Skin is warm.    ED Course  Procedures (including critical care time)  Labs Reviewed  CBC WITH DIFFERENTIAL - Abnormal; Notable for the following:    Neutrophils Relative 42 (*)     All other components within  normal limits  COMPREHENSIVE METABOLIC PANEL - Abnormal; Notable for the following:    Albumin 3.4 (*)     GFR calc non Af Amer 86 (*)     All other components within normal limits  D-DIMER, QUANTITATIVE - Abnormal; Notable for the following:    D-Dimer, Quant 0.52 (*)     All other components within normal limits  TROPONIN I  URINE RAPID DRUG SCREEN (HOSP PERFORMED)  URINALYSIS, ROUTINE W REFLEX MICROSCOPIC   Dg Chest 2  View  01/15/2012  *RADIOLOGY REPORT*  Clinical Data: Chest pain  CHEST - 2 VIEW  Comparison: 11/30/2011  Findings: Cardiac enlargement without heart failure.  Lungs are clear without infiltrate or effusion.  No mass or adenopathy.  Lung volume is normal.  IMPRESSION: Mild cardiac enlargement, unchanged.  No acute cardiopulmonary process.   Original Report Authenticated By: Camelia Phenes, M.D.    Ct Angio Chest W/cm &/or Wo Cm  01/15/2012  *RADIOLOGY REPORT*  Clinical Data: Chest and back pain  CT ANGIOGRAPHY CHEST  Technique:  Multidetector CT imaging of the chest using the standard protocol during bolus administration of intravenous contrast. Multiplanar reconstructed images including MIPs were obtained and reviewed to evaluate the vascular anatomy.  Contrast: OMNIPAQUE IOHEXOL 350 MG/ML SOLN  Comparison: None.  Findings: No significant filling defect or pulmonary embolus demonstrated by CTA.  Visualized pulmonary vasculature appears patent.  Normal heart size.  No pericardial or pleural effusion. No hiatal hernia.  Negative for adenopathy.  Gynecomastia noted.  Included upper abdomen demonstrates no acute process.  Thoracic degenerative changes present with mild curvature versus scoliosis.  Lung windows demonstrate no focal pneumonia, collapse, consolidation, interstitial process, the central airway abnormality, or pneumothorax.  IMPRESSION: Negative for significant acute pulmonary embolus by CTA.  No acute intrathoracic process  Gynecomastia  Thoracic degenerative changes and mild scoliosis.   Original Report Authenticated By: Judie Petit. Ruel Favors, M.D.      1. Chest pain       MDM  Intermittent chest pain for the past several weeks with multiple previous evaluations. No documented cardiac history. No change in character of the pain. Atypical for ACS.  Review of records shows patient has no documented cardiac history. She's never had a stress test or catheterization by his report. He is  uncertain who told him he had a heart attack in the past. EKG nonischemic. Troponin negative. His presentation is atypical for ACS. His pain is reproducible and worse with movement of his arm. CT negative for PE.      Date: 01/15/2012  Rate: 72  Rhythm: normal sinus rhythm  QRS Axis: right  Intervals: normal  ST/T Wave abnormalities: normal  Conduction Disutrbances:none  Narrative Interpretation:   Old EKG Reviewed: unchanged    Glynn Octave, MD 01/15/12 2128

## 2012-02-03 ENCOUNTER — Encounter (HOSPITAL_COMMUNITY): Payer: Self-pay

## 2012-02-03 ENCOUNTER — Emergency Department (HOSPITAL_COMMUNITY)
Admission: EM | Admit: 2012-02-03 | Discharge: 2012-02-03 | Disposition: A | Discharge status: Other Acute Inpatient Hospital | Payer: Self-pay | Attending: Emergency Medicine | Admitting: Emergency Medicine

## 2012-02-03 ENCOUNTER — Inpatient Hospital Stay (HOSPITAL_COMMUNITY)
Admission: RE | Admit: 2012-02-03 | Discharge: 2012-02-06 | DRG: 881 | Disposition: A | Discharge status: Home / Self Care | Payer: 59 | Source: Ambulatory Visit | Attending: Psychiatry | Admitting: Psychiatry

## 2012-02-03 DIAGNOSIS — F339 Major depressive disorder, recurrent, unspecified: Secondary | ICD-10-CM | POA: Diagnosis present

## 2012-02-03 DIAGNOSIS — F332 Major depressive disorder, recurrent severe without psychotic features: Secondary | ICD-10-CM | POA: Insufficient documentation

## 2012-02-03 DIAGNOSIS — F3289 Other specified depressive episodes: Principal | ICD-10-CM | POA: Diagnosis present

## 2012-02-03 DIAGNOSIS — K029 Dental caries, unspecified: Secondary | ICD-10-CM | POA: Insufficient documentation

## 2012-02-03 DIAGNOSIS — F329 Major depressive disorder, single episode, unspecified: Principal | ICD-10-CM | POA: Diagnosis present

## 2012-02-03 DIAGNOSIS — Z87891 Personal history of nicotine dependence: Secondary | ICD-10-CM | POA: Insufficient documentation

## 2012-02-03 DIAGNOSIS — I252 Old myocardial infarction: Secondary | ICD-10-CM | POA: Insufficient documentation

## 2012-02-03 DIAGNOSIS — F411 Generalized anxiety disorder: Secondary | ICD-10-CM | POA: Diagnosis present

## 2012-02-03 DIAGNOSIS — R45851 Suicidal ideations: Secondary | ICD-10-CM

## 2012-02-03 DIAGNOSIS — J45909 Unspecified asthma, uncomplicated: Secondary | ICD-10-CM | POA: Diagnosis present

## 2012-02-03 DIAGNOSIS — I10 Essential (primary) hypertension: Secondary | ICD-10-CM | POA: Diagnosis present

## 2012-02-03 DIAGNOSIS — Z79899 Other long term (current) drug therapy: Secondary | ICD-10-CM

## 2012-02-03 DIAGNOSIS — I2510 Atherosclerotic heart disease of native coronary artery without angina pectoris: Secondary | ICD-10-CM | POA: Insufficient documentation

## 2012-02-03 DIAGNOSIS — F489 Nonpsychotic mental disorder, unspecified: Secondary | ICD-10-CM | POA: Diagnosis present

## 2012-02-03 DIAGNOSIS — E669 Obesity, unspecified: Secondary | ICD-10-CM | POA: Insufficient documentation

## 2012-02-03 HISTORY — DX: Unspecified asthma, uncomplicated: J45.909

## 2012-02-03 HISTORY — DX: Essential (primary) hypertension: I10

## 2012-02-03 HISTORY — DX: Depression, unspecified: F32.A

## 2012-02-03 HISTORY — DX: Major depressive disorder, single episode, unspecified: F32.9

## 2012-02-03 HISTORY — DX: Unspecified convulsions: R56.9

## 2012-02-03 HISTORY — DX: Anxiety disorder, unspecified: F41.9

## 2012-02-03 LAB — CBC WITH DIFFERENTIAL/PLATELET
Basophils Absolute: 0 10*3/uL (ref 0.0–0.1)
Eosinophils Absolute: 0.3 10*3/uL (ref 0.0–0.7)
Eosinophils Relative: 7 % — ABNORMAL HIGH (ref 0–5)
HCT: 45.9 % (ref 39.0–52.0)
Lymphocytes Relative: 32 % (ref 12–46)
MCH: 27.2 pg (ref 26.0–34.0)
MCHC: 32.7 g/dL (ref 30.0–36.0)
MCV: 83.3 fL (ref 78.0–100.0)
Monocytes Absolute: 0.6 10*3/uL (ref 0.1–1.0)
RDW: 13.4 % (ref 11.5–15.5)
WBC: 3.8 10*3/uL — ABNORMAL LOW (ref 4.0–10.5)

## 2012-02-03 LAB — BASIC METABOLIC PANEL
CO2: 25 mEq/L (ref 19–32)
Calcium: 9.4 mg/dL (ref 8.4–10.5)
Creatinine, Ser: 0.99 mg/dL (ref 0.50–1.35)
GFR calc non Af Amer: >90 mL/min (ref >90)
Glucose, Bld: 89 mg/dL (ref 70–99)

## 2012-02-03 LAB — HEPATIC FUNCTION PANEL
Alkaline Phosphatase: 87 U/L (ref 39–117)
Bilirubin, Direct: 0.1 mg/dL (ref 0.0–0.3)
Indirect Bilirubin: 0.3 mg/dL (ref 0.3–0.9)
Total Bilirubin: 0.4 mg/dL (ref 0.3–1.2)

## 2012-02-03 LAB — RAPID URINE DRUG SCREEN, HOSP PERFORMED
Barbiturates: NOT DETECTED
Opiates: NOT DETECTED
Tetrahydrocannabinol: NOT DETECTED

## 2012-02-03 LAB — ETHANOL: Alcohol, Ethyl (B): <11 mg/dL (ref 0–11)

## 2012-02-03 MED ORDER — ACETAMINOPHEN 325 MG PO TABS: 650.0000 mg | ORAL_TABLET | ORAL | Status: DC | PRN

## 2012-02-03 MED ORDER — IBUPROFEN 400 MG PO TABS: 600.0000 mg | ORAL_TABLET | Freq: Three times a day (TID) | ORAL | Status: DC | PRN

## 2012-02-03 MED ORDER — ONDANSETRON HCL 4 MG PO TABS: 4.0000 mg | ORAL_TABLET | Freq: Three times a day (TID) | ORAL | Status: DC | PRN

## 2012-02-03 MED ORDER — CITALOPRAM HYDROBROMIDE 20 MG PO TABS
20.0000 mg | ORAL_TABLET | Freq: Every day | ORAL | Status: DC
  Administered 2012-02-03: 20 mg via ORAL
  Filled 2012-02-03 (×3): qty 1

## 2012-02-03 MED ORDER — DIPHENHYDRAMINE HCL 50 MG/ML IJ SOLN: 12.5000 mg | Freq: Once | INTRAMUSCULAR | Status: DC

## 2012-02-03 NOTE — ED Notes (Signed)
Pt's belongings placed in locker, pt's wedding band is also in bag.

## 2012-02-03 NOTE — ED Notes (Addendum)
Pt states he lost his job October 15th. His car was repossessed "a while ago (sometime before losing his job). Also states he is currently renting a house and "about to lose that too". States wife is pregnant and told him last night that she is no longer comfortable in relationship and is wanting to leave. Pt is tearful at times. Cooperative. Plan for self-harm is to cut his wrists.

## 2012-02-03 NOTE — ED Notes (Signed)
Pt here with SI after him and his spouse had an argument. Pt states, " i lost my job, my car and im losing my wife who is pregnant with my child. I feel like I don't have anything to live for now. I can't take it" pt tearful. States, " I guess I would just slit my wrist"

## 2012-02-03 NOTE — ED Notes (Signed)
Pts wife has called multiple times to speak with him and to try to obtain information. At patients request, no phone calls from pt. Wife asked if this was a rule or his request. I told her it was pretty much a rule at this time to avoid additional conflict between the two.

## 2012-02-03 NOTE — ED Notes (Signed)
Wife called earlier and left a message for pt to call her. He called her then stated he did not wish to receive any more phone calls.

## 2012-02-03 NOTE — ED Provider Notes (Signed)
History   This chart was scribed for Bobby Skene, MD by Bobby Bridges. This patient was seen in room [facility]A/[facility]A and the patient's care was started at 10:14.   CSN: [id number]  Arrival date & time 02/03/12  0902   First MD Initiated Contact with Patient 02/03/12 1008      Chief Complaint  Patient presents with  . V70.1    (Consider location/radiation/quality/duration/timing/severity/associated sxs/prior treatment) The history is provided by the patient. No language interpreter was used.  Bobby Bridges is a 36 y.o. male who presents to the Emergency Department with suicidal ideations because he has lost his job, house, and marriage is failing.  Pt had argument with wife last night that triggered suicidal thoughts.  Pt reports h/o depression for which he has previously been hospitalized, but that has been asymptomatic for "a while."  Pt states he would slit his wrists to try to kill himself.  Pt denies nausea, emesis, cough, visual disturbances, feelings of guilt or regret, sleep disturbances, and changes in appetite.  Pt has no further h/o chronic medical conditions.  Pt denies tobacco use but reports alcohol use.     Past Medical History  Diagnosis Date  . Coronary artery disease   . Myocardial infarction     History reviewed. No pertinent past surgical history.  No family history on file.  History  Substance Use Topics  . Smoking status: Never Smoker   . Smokeless tobacco: Not on file  . Alcohol Use: Yes      Review of Systems REVIEW OF SYSTEMS:   1.) CONSTITUTIONAL: No fever, chills or systemic signs of infection. No recent, unexplained weight changes.    2.) HEENT: No facial pain, sinus congestion or rhinorrhea is reported. Patient is denying any acute visual or hearing deficits. No sore throat or difficulty swallowing.  3.) NECK: No swelling or masses are reported.   4.) PULMONARY: No cough sputum production or shortness of breath was reported.   5.)  CARDIAC: No palpitations, chest pain or pressure.   6.) ABDOMINAL: Denies abdominal pain, nausea, vomiting or diarrhea. No Hematochezia or melena.  7.) GENITOURINARY: No burning with urination or frequency. No discharge.  8.) BACK: Denying any flank or CVA tenderness. No specific thoracic or lumbar pain.   9.) EXTREMITIES: Denying any extremity edema pitting or rash.   10.) NEUROLOGIC: Denying any focal or lateralizing neurologic impairments.     11.) SKIN: No rashes, itching  12.) HEME/LYMPH: No easy bruising/bleeding, no lymphadenopathy  13.) PSYCH: Positive for suicidal thoughts.  Denies homicidal thoughts  Allergies  Review of patient's allergies indicates no known allergies.  Home Medications  No current outpatient prescriptions on file.  BP 119/86  Pulse 99  Temp 97.5 F (36.4 C) (Oral)  Resp 16  SpO2 100%  Physical Exam  Nursing notes reviewed.  Electronic medical record reviewed. VITAL SIGNS:   Filed Vitals:   02/03/12 0918  BP: 119/86  Pulse: 99  Temp: 97.5 F (36.4 C)  TempSrc: Oral  Resp: 16  SpO2: 100%   CONSTITUTIONAL: Awake, oriented, appears non-toxic.  Obese. HENT: Atraumatic, normocephalic, oral mucosa pink and moist, airway patent. Nares patent without drainage. External ears normal.  Poor dentition.  Caries teeth 7 and 8.   EYES: Conjunctiva clear,  PERRLA.  Right eye extrophia. NECK: Trachea midline, non-tender, supple CARDIOVASCULAR: Normal heart rate, Normal rhythm, No murmurs, rubs, gallops PULMONARY/CHEST: Clear to auscultation, no rhonchi, wheezes, or rales. Symmetrical breath sounds. Non-tender. ABDOMINAL: Non-distended, soft, non-tender -  no rebound or guarding.  BS normal. NEUROLOGIC: Non-focal, moving all four extremities, no gross sensory or motor deficits. EXTREMITIES: No clubbing, cyanosis, or edema SKIN: Warm, Dry, No erythema, No rash  ED Course  CRITICAL CARE Performed by: Bobby Bridges Authorized by: Bobby Bridges Total critical care time: 30 minutes Critical care time was exclusive of separately billable procedures and treating other patients. Critical care was necessary to treat or prevent imminent or life-threatening deterioration of the following conditions: Suicidal ideation. Critical care was time spent personally by me on the following activities: discussions with consultants, obtaining history from patient or surrogate, ordering and review of laboratory studies, review of old charts, re-evaluation of patient's condition, ordering and performing treatments and interventions, examination of patient and development of treatment plan with patient or surrogate.   (including critical care time) DIAGNOSTIC STUDIES: Oxygen Saturation is 100% on room air, normal by my interpretation.    COORDINATION OF CARE: 10:21- Patient informed of clinical course, understands medical decision-making process, and agrees with plan.  Discussed telepsych.      Labs Reviewed  CBC WITH DIFFERENTIAL - Abnormal; Notable for the following:    WBC 3.8 (*)     Neutro Abs 1.6 (*)     Monocytes Relative 17 (*)     Eosinophils Relative 7 (*)     All other components within normal limits  URINE RAPID DRUG SCREEN (HOSP PERFORMED)  BASIC METABOLIC PANEL   Results for orders placed during the hospital encounter of 02/03/12  CBC WITH DIFFERENTIAL      Component Value Range   WBC 3.8 (*) 4.0 - 10.5 K/uL   RBC 5.51  4.22 - 5.81 MIL/uL   Hemoglobin 15.0  13.0 - 17.0 g/dL   HCT 16.1  09.6 - 04.5 %   MCV 83.3  78.0 - 100.0 fL   MCH 27.2  26.0 - 34.0 pg   MCHC 32.7  30.0 - 36.0 g/dL   RDW 40.9  81.1 - 91.4 %   Platelets 207  150 - 400 K/uL   Neutrophils Relative 43  43 - 77 %   Neutro Abs 1.6 (*) 1.7 - 7.7 K/uL   Lymphocytes Relative 32  12 - 46 %   Lymphs Abs 1.2  0.7 - 4.0 K/uL   Monocytes Relative 17 (*) 3 - 12 %   Monocytes Absolute 0.6  0.1 - 1.0 K/uL   Eosinophils Relative 7 (*) 0 - 5 %   Eosinophils  Absolute 0.3  0.0 - 0.7 K/uL   Basophils Relative 1  0 - 1 %   Basophils Absolute 0.0  0.0 - 0.1 K/uL  BASIC METABOLIC PANEL      Component Value Range   Sodium 139  135 - 145 mEq/L   Potassium 3.4 (*) 3.5 - 5.1 mEq/L   Chloride 104  96 - 112 mEq/L   CO2 25  19 - 32 mEq/L   Glucose, Bld 89  70 - 99 mg/dL   BUN 11  6 - 23 mg/dL   Creatinine, Ser 7.82  0.50 - 1.35 mg/dL   Calcium 9.4  8.4 - 95.6 mg/dL   GFR calc non Af Amer >90  >90 mL/min   GFR calc Af Amer >90  >90 mL/min  URINE RAPID DRUG SCREEN (HOSP PERFORMED)      Component Value Range   Opiates NONE DETECTED  NONE DETECTED   Cocaine NONE DETECTED  NONE DETECTED   Benzodiazepines NONE DETECTED  NONE DETECTED  Amphetamines NONE DETECTED  NONE DETECTED   Tetrahydrocannabinol NONE DETECTED  NONE DETECTED   Barbiturates NONE DETECTED  NONE DETECTED  HEPATIC FUNCTION PANEL      Component Value Range   Total Protein 7.4  6.0 - 8.3 g/dL   Albumin 3.5  3.5 - 5.2 g/dL   AST 25  0 - 37 U/L   ALT 32  0 - 53 U/L   Alkaline Phosphatase 87  39 - 117 U/L   Total Bilirubin 0.4  0.3 - 1.2 mg/dL   Bilirubin, Direct 0.1  0.0 - 0.3 mg/dL   Indirect Bilirubin 0.3  0.3 - 0.9 mg/dL  ETHANOL      Component Value Range   Alcohol, Ethyl (B) <11  0 - 11 mg/dL    No results found.   No diagnosis found.    MDM  EVERET FLAGG is a 36 y.o. male resenting a severe depressive episode with suicidal ideation.  Sitter obtained.  Labs unremarkable. Obtained tele-psych consult was in agreement that the patient requires inpatient therapy. We'll start him on 20 mg of Celexa daily per tele-psych recommendations, patient was also on this medication before discontinuing it himself.  [facility] team has consult on the patient - looking for bed.   I personally performed the services described in this documentation, which was scribed in my presence. The recorded information has been reviewed and considered. Bobby Bridges,  M.D.          Bobby Skene, MD 02/03/12 1610

## 2012-02-03 NOTE — BH Assessment (Addendum)
Assessment Note   Bobby Bridges is an 36 y.o. male. Pt comes to [facility] reporting suicidal ideation with plan to cut wrist.  Pt reports that he had an argument with his wife last night and he "can't take it anymore."  Pt reports additional recent stressors: lost his job 01/22/12, has since lost his housing and his car.  Pt reports his wife is going to stay with her mom, where he is not welcome and that he is now homeless.  Pt was hospitalized at [facility] 10/2011, states he ran out of medication from that stay one month ago.  Pt reports that he has an appt at [facility] on 02/29/12 to get meds restarted.  Pt denies any substance/alcohol use and UDS/BAC both negative.  Pt denies HI/AV.  Pt reports a history of auditory and visual hallucinations but states he has not had them since his hospitalization at [facility].  Axis I: Major Depression, Recurrent severe Axis II: Deferred Axis III:  Past Medical History  Diagnosis Date  . Coronary artery disease   . Myocardial infarction    Axis IV: economic problems, housing problems, occupational problems and problems with primary support group Axis V: 31-40 impairment in reality testing  Past Medical History:  Past Medical History  Diagnosis Date  . Coronary artery disease   . Myocardial infarction     History reviewed. No pertinent past surgical history.  Family History: No family history on file.  Social History:  reports that he has never smoked. He does not have any smokeless tobacco history on file. He reports that he drinks alcohol. He reports that he uses illicit drugs (Marijuana).  Additional Social History:  Alcohol / Drug Use Pain Medications: Pt denies drug or alcohol use.  UDS and BAC negative History of alcohol / drug use?: No history of alcohol / drug abuse  CIWA: CIWA-Ar BP: 119/86 mmHg Pulse Rate: 99  COWS:    Allergies: No Known Allergies  Home Medications:  (Not in a hospital admission)  OB/GYN Status:  No  LMP for male patient.  General Assessment Data Location of Assessment: [facility] ACT Assessment: Yes Living Arrangements: Other (Comment) (homeless) Can pt return to current living arrangement?: Yes Admission Status: Voluntary Is patient capable of signing voluntary admission?: Yes     Risk to self Suicidal Ideation: Yes-Currently Present Suicidal Intent: Yes-Currently Present Is patient at risk for suicide?: Yes Suicidal Plan?: Yes-Currently Present Specify Current Suicidal Plan: cut wrist Access to Means: Yes Specify Access to Suicidal Means: knife What has been your use of drugs/alcohol within the last 12 months?: denies use, UDS BAC negative Previous Attempts/Gestures: No Intentional Self Injurious Behavior: Cutting Comment - Self Injurious Behavior: one time in past.  Not current issue Family Suicide History: No Recent stressful life event(s): Job Loss;Financial Problems;Conflict (Comment) (lost job 10/15, lost housing.  Fight with wife last night) Persecutory voices/beliefs?: No Depression: Yes Depression Symptoms: Insomnia;Tearfulness;Isolating;Fatigue;Feeling angry/irritable Substance abuse history and/or treatment for substance abuse?: No Suicide prevention information given to non-admitted patients: Not applicable  Risk to Others Homicidal Ideation: No Thoughts of Harm to Others: No Current Homicidal Intent: No Current Homicidal Plan: No Access to Homicidal Means: No History of harm to others?: No Assessment of Violence: None Noted Does patient have access to weapons?: No (pt reports he gave his guns to brother some time ago) Criminal Charges Pending?: No Does patient have a court date: No  Psychosis Hallucinations: None noted Delusions: None noted  Mental Status Report  Appear/Hygiene:  (casual) Eye Contact: Good Motor Activity: Unremarkable Speech: Logical/coherent Level of Consciousness: Alert Mood: Depressed Affect: Appropriate to circumstance Anxiety  Level: None Thought Processes: Coherent;Relevant Judgement: Unimpaired Orientation: Person;Place;Time;Situation Obsessive Compulsive Thoughts/Behaviors: None  Cognitive Functioning Concentration: Normal Memory: Recent Intact;Remote Intact IQ: Average Insight: Good Impulse Control: Fair Appetite: Fair Weight Loss: 100  (over period of time) Weight Gain: 0  Sleep: Decreased Total Hours of Sleep: 3  Vegetative Symptoms: None  ADLScreening [facility] Assessment Services) Patient's cognitive ability adequate to safely complete daily activities?: Yes Patient able to express need for assistance with ADLs?: Yes Independently performs ADLs?: Yes (appropriate for developmental age)  Abuse/Neglect [facility]) Physical Abuse: Denies Verbal Abuse: Denies Sexual Abuse: Denies  Prior Inpatient Therapy Prior Inpatient Therapy: Yes Prior Therapy Dates: 10/2011 Prior Therapy Facilty/Provider(s): [facility] Reason for Treatment: psych  Prior Outpatient Therapy Prior Outpatient Therapy: No (intake appt at [facility] 02/29/12)  ADL Screening (condition at time of admission) Patient's cognitive ability adequate to safely complete daily activities?: Yes Patient able to express need for assistance with ADLs?: Yes Independently performs ADLs?: Yes (appropriate for developmental age) Weakness of Legs: None Weakness of Arms/Hands: None       Abuse/Neglect Assessment (Assessment to be complete while patient is alone) Physical Abuse: Denies Verbal Abuse: Denies Sexual Abuse: Denies Exploitation of patient/patient's resources: Denies Self-Neglect: Denies Values / Beliefs Cultural Requests During Hospitalization: None Spiritual Requests During Hospitalization: None   Advance Directives (For Healthcare) Advance Directive: Patient does not have advance directive;Patient would not like information    Additional Information 1:1 In Past 12 Months?: No CIRT Risk: No Elopement Risk: No Does patient have medical  clearance?: Yes     Disposition: Discussed this pt with Dr Rulon Abide of [facility].  Pt will be referred for inpt psych treatment.  [facility] full.  Info faxed to [place], [place], [place], and [facility].  11:30: Herbert Seta at [facility] reports no beds at this time. Disposition Disposition of Patient: Inpatient treatment program Type of inpatient treatment program: Adult  On Site Evaluation by:   Reviewed with Physician:     Lorri Frederick 02/03/2012 11:10 AM

## 2012-02-04 ENCOUNTER — Encounter (HOSPITAL_COMMUNITY): Payer: Self-pay | Admitting: *Deleted

## 2012-02-04 DIAGNOSIS — F329 Major depressive disorder, single episode, unspecified: Secondary | ICD-10-CM

## 2012-02-04 MED ORDER — TRAZODONE HCL 50 MG PO TABS
50.0000 mg | ORAL_TABLET | Freq: Every evening | ORAL | Status: DC | PRN
  Filled 2012-02-04: qty 14

## 2012-02-04 MED ORDER — CITALOPRAM HYDROBROMIDE 20 MG PO TABS
20.0000 mg | ORAL_TABLET | Freq: Every day | ORAL | Status: DC
  Administered 2012-02-04 – 2012-02-06 (×3): 20 mg via ORAL
  Filled 2012-02-04: qty 14
  Filled 2012-02-04 (×5): qty 1

## 2012-02-04 MED ORDER — ACETAMINOPHEN 325 MG PO TABS
650.0000 mg | ORAL_TABLET | Freq: Four times a day (QID) | ORAL | Status: DC | PRN
  Administered 2012-02-04: 650 mg via ORAL

## 2012-02-04 MED ORDER — TRAZODONE HCL 50 MG PO TABS: 50.0000 mg | ORAL_TABLET | Freq: Every evening | ORAL | Status: DC | PRN

## 2012-02-04 MED ORDER — MAGNESIUM HYDROXIDE 400 MG/5ML PO SUSP: 30.0000 mL | Freq: Every day | ORAL | Status: DC | PRN

## 2012-02-04 MED ORDER — ALUM & MAG HYDROXIDE-SIMETH 200-200-20 MG/5ML PO SUSP: 30.0000 mL | ORAL | Status: DC | PRN

## 2012-02-04 NOTE — [facility] Suicide Risk Assessment (Signed)
Suicide Risk Assessment  Admission Assessment     Nursing information obtained from:  Patient Demographic factors:  Male;Divorced or widowed;Low socioeconomic status;Living alone;Unemployed Current Mental Status:  Self-harm thoughts Loss Factors:  Decrease in vocational status;Loss of significant relationship;Decline in physical health;Financial problems / change in socioeconomic status Historical Factors:  NA Risk Reduction Factors:  Sense of responsibility to family (wife is pregnant but evicted PT from home)  CLINICAL FACTORS:   Depression:   Anhedonia Hopelessness Insomnia  COGNITIVE FEATURES THAT CONTRIBUTE TO RISK: Denies    SUICIDE RISK:   Moderate:  Frequent suicidal ideation with limited intensity, and duration, some specificity in terms of plans, no associated intent, good self-control, limited dysphoria/symptomatology, some risk factors present, and identifiable protective factors, including available and accessible social support.  PLAN OF CARE: Supportive approach/coping skills                               Resume his medications                               Continue a relapse prevention plan.   Lashika Erker A 02/04/2012, 1:50 PM

## 2012-02-04 NOTE — Progress Notes (Signed)
Patient ID: Bobby Bridges, male   DOB: Apr 12, 1975, 36 y.o.   MRN: [id number] Patient admitted to the inpatient unit at [facility] for suicidal ideations with a plan to cut his wrists.  He reports issues initiating and maintaining sleep, appetite normal, depression and hopelessness 8/10.  Bobby Bridges reported being on a HTN medication but when his pharmacy was contacted, he had not been on it for past a year.  No new BP medication ordered due to stable blood pressures and vital signs.  Celexa 20 mg started for his depression, discharged on this medication in August but did not take any after his samples were depleted.  Bobby Bridges currently denies homicidal and auditory/visual hallucinations. He was not suicidal on this assessment but stated he was; does contract for safety with 15 minute checks in place.  Denies any physical issues at this time.  His concern is lack of employment and housing; wants to provide for his wife and expectant child (March 2014).      Reviewed, agree with recommendations.

## 2012-02-04 NOTE — Progress Notes (Signed)
Psychoeducational Group Note  Date:  02/04/2012 Time:  2000  Group Topic/Focus:  Wrap-Up Group:   The focus of this group is to help patients review their daily goal of treatment and discuss progress on daily workbooks.  Participation Level:  Minimal  Participation Quality:  Inattentive, Redirectable and Sharing  Affect:  Flat  Cognitive:  Confused  Insight:  Limited  Engagement in Group:  Limited  Additional Comments: Patient shared that he looked forward to going home to his family.  Yannick Steuber, Newton Pigg 02/04/2012, 9:36 PM

## 2012-02-04 NOTE — Tx Team (Signed)
Initial Interdisciplinary Treatment Plan  PATIENT STRENGTHS: (choose at least two) Capable of independent living Motivation for treatment/growth  PATIENT STRESSORS: Financial difficulties Health problems Marital or family conflict Medication change or noncompliance Occupational concerns   PROBLEM LIST: Problem List/Patient Goals Date to be addressed Date deferred Reason deferred Estimated date of resolution  Ineffective individual coping      Major depressive disorder, rec      Anxiety      SI/thoughts of self-harm      Marital disruption                               DISCHARGE CRITERIA:  Ability to meet basic life and health needs Improved stabilization in mood, thinking, and/or behavior Motivation to continue treatment in a less acute level of care Need for constant or close observation no longer present Verbal commitment to aftercare and medication compliance  PRELIMINARY DISCHARGE PLAN: Outpatient therapy  PATIENT/FAMIILY INVOLVEMENT: This treatment plan has been presented to and reviewed with the patient, Bobby Bridges.  The patient and family have been given the opportunity to ask questions and make suggestions.  Dion Saucier M 02/04/2012, 2:08 AM

## 2012-02-04 NOTE — H&P (Signed)
Psychiatric Admission Assessment Adult  Patient Identification:  Bobby Bridges Date of Evaluation:  02/04/2012 Chief Complaint:  MDD,REC,SEV History of Present Illness:: Patient went to the ED with suicidal ideations with a plan to cut his wrists.  He stated he lost his job on 01/22/12 because his employer did not receive his [employer] papers.  Bobby Bridges also lost his housing and car.  His wife went to stay with her mother but he is not welcome to stay there.  Bobby Bridges reports being estranged from his family and due with his first child in March, 2014.  He has been depressed and stressed since the loss of his job.  Bobby Bridges also ran out of his depression and HTN medications. Mood Symptoms:  Anhedonia, Concentration, Depression, Helplessness, Hopelessness, Past 2 Weeks, Sadness, SI, Sleep, Worthlessness, Depression Symptoms:  depressed mood, anhedonia, insomnia, fatigue, hopelessness, suicidal thoughts with specific plan, disturbed sleep, (Hypo) Manic Symptoms:  None noted or reported Anxiety Symptoms:  Excessive Worry, Psychotic Symptoms:  Patient denies  PTSD Symptoms:  Patient denies  Past Psychiatric History: Diagnosis:  Major Depression, Recurrent  Hospitalizations:  11/2011  Outpatient Care:  None  Substance Abuse Care:  None  Self-Mutilation:  Denies  Suicidal Attempts:  None  Violent Behaviors:  None   Past Medical History:   Past Medical History  Diagnosis Date  . Coronary artery disease   . Myocardial infarction   . Hypertension   . Anxiety   . Depression   . Asthma   . Seizures     outgrew by age 17   Seizures:  One febrile seizure as a child (per patient) Cardiac History:  Small MI (per patient), HTN Allergies:  No Known Allergies PTA Medications: "HTN medication"  Previous Psychotropic Medications:  Medication/Dose:   Celexa               Substance Abuse History in the last 12 months: Substance Age of 1st Use Last Use Amount Specific Type   Nicotine   a year ago    Alcohol   a year ago    Cannabis      Opiates      Cocaine      Methamphetamines      LSD      Ecstasy      Benzodiazepines      Caffeine      Inhalants      Others:   a year ago                       Consequences of Substance Abuse:  No substance abuse since a year ago  Social History: Current Place of Residence:  [place] (homeless) Place of Birth:  [place], [place] Family Members:  Spouse, estranged from the rest of his family Marital Status:  Married Children:  Expecting in 3/14  Sons:  Daughters: Relationships:  Spouse Education:   Educational Problems/Performance: Religious Beliefs/Practices: History of Abuse (Emotional/Phsycial/Sexual):  None Occupational Experiences; Military History:  None. Legal History: Hobbies/Interests:  Family History:   Family History  Problem Relation Age of Onset  . [name]: Yes    Mental Status Examination/Evaluation: Objective:  Appearance: Casual  Eye Contact::  Fair  Speech:  Normal Rate  Volume:  Normal  Mood:  Depressed, Hopeless and Worthless  Affect:  Congruent  Thought Process:  Coherent, Intact and Logical  Orientation:  Full  Thought Content:  WDL  Suicidal Thoughts:  Yes.  without intent/plan, plan in the ED but  not currently  Homicidal Thoughts:  No  Memory:  Immediate;   Fair Recent;   Fair Remote;   Fair  Judgement:  Fair  Insight:  Lacking  Psychomotor Activity:  Normal  Concentration:  Fair  Recall:  Fair  Akathisia:  No  Handed:  Right  AIMS (if indicated):     Assets:  Intimacy Physical Health  Sleep:  Number of Hours: 4     Laboratory/X-Ray Psychological Evaluation(s)   Completed in ED, reviewed    Assessment:  Physical and Review of Systems completed on admission to the ED, reviewed at [facility]  AXIS I:  Generalized Anxiety Disorder and Major Depression, Recurrent severe AXIS II:  Deferred AXIS III:   Past Medical History  Diagnosis Date  .  Coronary artery disease   . Myocardial infarction   . Hypertension   . Anxiety   . Depression   . Asthma   . Seizures     outgrew by age 32   AXIS IV:  economic problems, educational problems, housing problems, occupational problems, other psychosocial or environmental problems, problems related to social environment and problems with primary support group AXIS V:  31-40 impairment in reality testing  Treatment Plan/Recommendations:  Individual and group therapy, one-one with MD or extender, medication management, social worker involvement for discharge planning  Treatment Plan Summary: Daily contact with patient to assess and evaluate symptoms and progress in treatment Medication management Current Medications:  Current Facility-Administered Medications  Medication Dose Route Frequency Provider Last Rate Last Dose  . acetaminophen (TYLENOL) tablet 650 mg  650 mg Oral Q6H PRN Caroleen Hamman, NP   650 mg at 02/04/12 0831  . alum & mag hydroxide-simeth (MAALOX/MYLANTA) 200-200-20 MG/5ML suspension 30 mL  30 mL Oral Q4H PRN Caroleen Hamman, NP      . magnesium hydroxide (MILK OF MAGNESIA) suspension 30 mL  30 mL Oral Daily PRN Caroleen Hamman, NP      . traZODone (DESYREL) tablet 50 mg  50 mg Oral QHS PRN,MR X 1 Caroleen Hamman, NP       Facility-Administered Medications Ordered in Other Encounters  Medication Dose Route Frequency Provider Last Rate Last Dose  . DISCONTD: acetaminophen (TYLENOL) tablet 650 mg  650 mg Oral Q4H PRN John-Adam Bonk, MD      . DISCONTD: citalopram (CELEXA) tablet 20 mg  20 mg Oral Daily John-Adam Bonk, MD   20 mg at 02/03/12 1559  . DISCONTD: diphenhydrAMINE (BENADRYL) injection 12.5 mg  12.5 mg Intravenous Once John-Adam Bonk, MD      . DISCONTD: ibuprofen (ADVIL,MOTRIN) tablet 600 mg  600 mg Oral Q8H PRN John-Adam Bonk, MD      . DISCONTD: ondansetron (ZOFRAN) tablet 4 mg  4 mg Oral Q8H PRN John-Adam Bonk, MD        Observation Level/Precautions:  Every 15 min  checks per routine, contracts for safety   Laboratory:  Completed in the ED, K+  3.4 (oranges and bananas encouraged), WBC slightly low--no S&S of infectin  Psychotherapy:  Individual and group  Medications:  Celexa and HTN medication  Routine PRN Medications:  Yes  Consultations:  None  Discharge Concerns:  Housing, occupational    Other:     Nanine Means 10/28/20131:09 PM  Patient seen and assessed. Agree with above assessment and recommendations.

## 2012-02-04 NOTE — Progress Notes (Signed)
Patient ID: Bobby Bridges, male   DOB: Sep 27, 1975, 36 y.o.   MRN: [id number] D: Pt is a 36 y.o. male, presented to [facility] ED with active SI with plan to slit wrists.  Admitted as voluntary to [facility] 507-1 @ 0005 on 02/04/2012.  Reports currents stressors as loss of job, repossession of car, eviction from home by wife (who is pregnant with his child), having been informed by his sister that she went to his home and found Pt's wife engaged in sexual activity with unknown male while another baby was left alone crying.  Pt states "I have nothing to live for, I can't take it anymore".  Has most recently been staying in rented room but is now homeless due to no income.  Pt has one previous [facility] admission dated 12/01/2011 in system (due to SI and "psychosis"- as per reporting RN), but denies other psychiatric history for self or family members.  Reports occasional use of EtOH and marijauna, denies street or prescription drug abuse.  UDS negative at [facility] ED.  Medical history significant for "mild" MI in "early 2013", for which Pt was treated and discharged from unknown ED; CAD; asthma; hypertension (for which meds have been prescribed but Pt has been unable to obtain); obesity; and childhood seizures that stopped during junior high school. Skin assessment unremarkable. Pt reports psychiatric symptoms of depression, anxiety, sadness, hopelessness, worthlessness, thoughts of self-harm with plan.  Facial expression is masked (with tearful breakthoughs x 2 during interview); affect is blunted, guarded, and sad; mood is depressed, sad, and anxious.  Pt acknowledges current thoughts of self-harm by cutting wrists with no intent to act on them.  Denies current HI, AH/VH.  Currently taking no medications PTA.  Contracting for safety.  A: Pt oriented to unit, introduced to staff (does recall several from previous admission and "felt better"), and treatment agreement reviewed.  Placed on Q15 minute safety checks per unit  protocol.  Asleep before orders were verified by pharmacy; no sleep PRN given.  R: Pt stated he is "relieved to be here" and "really needs help"; affect slightly brighter after settling into room.  Safety maintained.

## 2012-02-04 NOTE — Progress Notes (Signed)
Psychoeducational Group Note  Date:  02/04/2012 Time:  1100  Group Topic/Focus:  Self Care:   The focus of this group is to help patients understand the importance of self-care in order to improve or restore emotional, physical, spiritual, interpersonal, and financial health.  Participation Level:  Active  Participation Quality:  Appropriate, Sharing and Supportive  Affect:  Appropriate  Cognitive:  Appropriate  Insight:  Good  Engagement in Group:  Good  Additional Comments:  none  Yvett Rossel M 02/04/2012, 1:18 PM

## 2012-02-04 NOTE — Progress Notes (Addendum)
D:  Patient's self inventory sheet, patient sleeps well, has improving appetite, low energy level, good attention span.  Rated depression and hopelessness #8.  Denied withdrawals.  Denied SI off/on, contracts for safety.  Has experienced pain, headaches in past 24 hours.  Pain goal #8, worst pain #8.   After discharge, "homeless but try to find a job and take care of my unborn child but I'm alone, no one in corner.  Could ya'll help me find a job and place to live."  Does not have discharge plans.  No problems taking meds after discharge but has money problems. A:  Medications given per MD order.  Support and encouragement given throughout day.  Support and safety checks completed as ordered. R:  Following treatment plan.  Denied SI and HI.   Denied A/V hallucinations.  Contracts for safety.  Patient remains safe and receptive on unit.    Patient has been cooperative and pleasant.  Patient went to groups today, did not participate in one group. 1825   After dinner patient stated his right side near his breast has been hurting off/on since yesterday, rated pain #9.  Tylenol given.

## 2012-02-04 NOTE — Progress Notes (Signed)
Psychoeducational Group Note  Date:  02/04/2012 Time:  1:15  Group Topic/Focus:  Overcoming Obstacles to Wellness  Participation Level:  None  Participation Quality:  Drowsy  Affect:  N/a  Cognitive:  n/a  Insight:  n/a  Engagement in Group:  None  Additional Comments:  Bobby Bridges did not speak and appeared to be sleeping for much of group.  Bridges,Bobby S 02/04/2012, 2:44 PM  Ashley Jacobs, MSW LCSW [phone]

## 2012-02-04 NOTE — Tx Team (Signed)
Interdisciplinary Treatment Plan Update (Adult)  Date:  02/04/2012  Time Reviewed:  10:39 AM   Progress in Treatment: Attending groups:   Yes   Participating in groups:  Yes Taking medication as prescribed:  Yes Tolerating medication:  Yes Family/Significant othe contact made:  Contact to be made Patient understands diagnosis:  Yes Discussing patient identified problems/goals with staff: Yes Medical problems stabilized or resolved: Yes Denies suicidal/homicidal ideation: No.  Patient is able to contract for safety Issues/concerns per patient self-inventory:  Other:  New problem(s) identified:  Reason for Continuation of Hospitalization: Anxiety Depression Medication stabilization Suicidal ideation  Interventions implemented related to continuation of hospitalization:  Medication Management; safety checks q 15 mins  Additional comments:  Estimated length of stay: 3-4 days  Discharge Plan:  Home with outpatient follow up at [facility]):  Review of initial/current patient goals per problem list:    1.  Goal(s): Eliminate SI/other thoughts of self harm   Met:  No  Target date: d/c  As evidenced by: Patient will no longer endorse SI/other thought self harm    2.  Goal (s): Reduce depression/anxiety (rated at six and eight today)   Met:  No  Target date: d/c  As evidenced by: Patient will rate symptoms at four or below    3.  Goal(s): .stabilize on meds   Met:  No  Target date: d/c  As evidenced by: Patient will report being stable on medications - symptoms have decreased    4.  Goal(s): Refer for outpatient follow up   Met:  No  Target date: d/c  As evidenced by: Follow up appointment will be scheduled    Attendees: Patient:   02/04/2012 10:39 AM  Physican:  Geoffery Lyons, MD 02/04/2012 10:39 AM  Nursing:  Neill Loft, RN 02/04/2012 10:39 AM   Nursing:   Quintella Reichert, RN 02/04/2012 10:39 AM   Clinical Social  Worker:  Juline Patch, LCSW 02/04/2012 10:39 AM   Other: Norval Gable, FNP 02/04/2012 10:39 AM

## 2012-02-04 NOTE — Progress Notes (Signed)
Nutrition Note  Reason: MST score of 3  Patient reported his appetite has not been great. Patient eating a snack at time of RD visit. Patient reported he is eating about 50-75% of his meals. He reported he was not eating well PTA, maybe 1 meal a day. He reported decreased appetite and intake due to having his wife and unborn child on his mind. He reported he used to weigh 400 lb a wile ago. He reported he just keeps loosing weight.   Wt Readings from Last 10 Encounters:  02/04/12 298 lb (135.172 kg)  01/15/12 300 lb (136.079 kg)  12/01/11 307 lb 5 oz (139.396 kg)  12/01/11 309 lb 2 oz (140.218 kg)  09/18/11 338 lb (153.316 kg)  07/28/11 338 lb 5 oz (153.458 kg)  02/03/08 385 lb (174.635 kg)  06/27/06 378 lb (171.46 kg)  04/22/06 375 lb (170.099 kg)    I have educated the patient on the importance of eating three regular meals daily. I have educated the patient on good sources of protein and encouraged him to incorporate them into meals. I have encouraged the patient to increase PO intake. I have encouraged the patient to get carnation instant breakfast after discharge to drink at meal times if he is unable to eat meals. The patient was without any further nutrition related questions and verbalized understanding of the nutrition information provided.   RD available for nutrition needs.   Iven Finn [facility], [place] [phone]

## 2012-02-05 DIAGNOSIS — F329 Major depressive disorder, single episode, unspecified: Principal | ICD-10-CM

## 2012-02-05 NOTE — Progress Notes (Signed)
Patient attended group this evening and participated, when patient was finished talking he kept his head down and eyes closed and appeared to be sleeping. Writer spoke with patient after group and informed patient of prn medications available if needed. Writer inquired as to what his plans were after discharge. Patient reports that he want to go visit his wife and spoke about her being pregnant and this being his first child. Patient reports feeling nervous and not knowing what to expect. Patient was offered support and encouragement, currently denies pain, - si/hi/a/v hallucinations, safety maintained on unit, will continue to monitor.

## 2012-02-05 NOTE — Tx Team (Signed)
Interdisciplinary Treatment Plan Update (Adult)  Date:  02/05/2012  Time Reviewed:  10:32 AM   Progress in Treatment: Attending groups:   Yes   Participating in groups:  Yes Taking medication as prescribed:  Yes Tolerating medication:  Yes Family/Significant othe contact made: Contact to be made with pastor with whom patient will live Patient understands diagnosis:  Yes Discussing patient identified problems/goals with staff: Yes Medical problems stabilized or resolved: Yes Denies suicidal/homicidal ideation:Yes Issues/concerns per patient self-inventory:  Other:  New problem(s) identified:  Reason for Continuation of Hospitalization: Anxiety Depression Medication stabilization  Interventions implemented related to continuation of hospitalization:  Medication Management; safety checks q 15 mins  Additional comments:  Estimated length of stay:  Discharge Plan:  New goal(s):  Review of initial/current patient goals per problem list:   1.  Goal(s):  Met:  Yes  Target date: d/c  As evidenced by: Patient no longer endorsing SI/HI or other thoughts of self harm.    2.  Goal (s):  Reduce depression/anxiety (rated at one today)   Met:  Yes  Target date: d/c  As evidenced by: Patient currently rating symptoms at four or below    3.  Goal(s): .stabilize on meds   Met:  No  Target date: d/c  As evidenced by: Patient will report being stable on medications - symptoms have decreased    4.  Goal(s):  Refer for outpatient follow up   Met:  No  Target date: d/c  As evidenced by: Follow up appointment will be scheduled    Attendees: Patient:   02/05/2012 10:32 AM  Physican:  Patrick North, MD 02/05/2012 10:32 AM  Nursing:   02/05/2012 10:32 AM   Nursing:    02/05/2012 10:32 AM   Clinical Social Worker:  Juline Patch, LCSW 02/05/2012 10:32 AM   Other: 02/05/2012 10:32 AM

## 2012-02-05 NOTE — Progress Notes (Signed)
Patient ID: Bobby Bridges, male   DOB: Oct 30, 1975, 36 y.o.   MRN: [id number] D: Pt. In day room watching TV. Pt. Shares with Clinical research associate "hoping to be discharged tomorrow. Pt. Reports depression and anxiety at "2" of 10.  Pt. Denies AVH.  A: Staff will monitor q39min for safety. Writer encouraged group. Writer provided emotional support by encouraging pt. To feel free to verbalize concerns. R: Pt. Is safe on the unit and attends group.

## 2012-02-05 NOTE — Progress Notes (Signed)
[facility] Group Notes:  (Counselor/Nursing/MHT/Case Management/Adjunct)      Feelings About Diagnosis    February 05, 2011 @ 1:15 -2 :30 PM     Discharge Planning Group  8:30 - 9:30 AM     02/05/2012 4:20 PM  Type of Therapy:  Group Therapy  Participation Level:  None  Participation Quality:  Drowsy  Affect:  Depressed  Cognitive:  Appropriate  Insight:  Limited  Engagement in Group:  Limited  Engagement in Therapy:  Limited  Modes of Intervention:  Orientation, Support and Exploration  Summary of Progress/Problems:  Kennedy participated in group. He shared that he is feeling better about his illness.  He stated he plans to spend more time in prayer and reading the  Bible.   Patient was encouraged to engaged in these activities but to also make sure he takes his medications.   Wynn Banker 02/05/2012, 4:20 PM

## 2012-02-05 NOTE — Progress Notes (Signed)
Grief and Loss Group  Group focused on issues ranging from a sense of loss of self to losses related to separation, suicide of family members, absence of parental nurturing. There was some discussion related to coping with grief in healthy ways, ability to forgive self and being open to beginning again or granting self the right to change.   Pt attentive but quiet.  Dyke Maes

## 2012-02-05 NOTE — Progress Notes (Signed)
[facility] MD Progress Note  02/05/2012 10:36 AM  S: Patient reports feeling better. Denies suicidal ideations this morning. Able to sleep okay.  Diagnosis:   Axis I: Depressive Disorder NOS Axis II: No diagnosis Axis III:  Past Medical History  Diagnosis Date  . Coronary artery disease   . Myocardial infarction   . Hypertension   . Anxiety   . Depression   . Asthma   . Seizures     outgrew by age 36   Axis IV: economic problems and occupational problems Axis V: 51-60 moderate symptoms  ADL's:  Intact  Sleep: Fair  Appetite:  Fair  Mental Status Examination/Evaluation: Objective:  Appearance: Disheveled  Eye Contact::  Good  Speech:  Clear and Coherent  Volume:  Decreased  Mood:  Anxious and Depressed  Affect:  Blunt  Thought Process:  Coherent  Orientation:  Full  Thought Content:  WDL  Suicidal Thoughts:  No  Homicidal Thoughts:  No  Memory:  Immediate;   Fair Recent;   Fair Remote;   Fair  Judgement:  Fair  Insight:  Fair  Psychomotor Activity:  Normal  Concentration:  Fair  Recall:  Fair  Akathisia:  No  Handed:  Right  AIMS (if indicated):     Assets:  Desire for Improvement  Sleep:  Number of Hours: 6.5    Vital Signs:Blood pressure 140/80, pulse 81, temperature 98.1 F (36.7 C), temperature source Oral, resp. rate 17, height 5' 9.5" (1.765 m), weight 135.172 kg (298 lb). Current Medications: Current Facility-Administered Medications  Medication Dose Route Frequency Provider Last Rate Last Dose  . acetaminophen (TYLENOL) tablet 650 mg  650 mg Oral Q6H PRN Mikolaj Woolstenhulme, MD   650 mg at 02/04/12 1815  . alum & mag hydroxide-simeth (MAALOX/MYLANTA) 200-200-20 MG/5ML suspension 30 mL  30 mL Oral Q4H PRN Don Giarrusso, MD      . citalopram (CELEXA) tablet 20 mg  20 mg Oral Daily Nanine Means, NP   20 mg at 02/05/12 0739  . magnesium hydroxide (MILK OF MAGNESIA) suspension 30 mL  30 mL Oral Daily PRN Jordi Lacko, MD      . traZODone (DESYREL) tablet 50  mg  50 mg Oral QHS PRN Aubrea Meixner, MD      . DISCONTD: acetaminophen (TYLENOL) tablet 650 mg  650 mg Oral Q6H PRN Caroleen Hamman, NP   650 mg at 02/04/12 0831  . DISCONTD: alum & mag hydroxide-simeth (MAALOX/MYLANTA) 200-200-20 MG/5ML suspension 30 mL  30 mL Oral Q4H PRN Caroleen Hamman, NP      . DISCONTD: magnesium hydroxide (MILK OF MAGNESIA) suspension 30 mL  30 mL Oral Daily PRN Caroleen Hamman, NP      . DISCONTD: traZODone (DESYREL) tablet 50 mg  50 mg Oral QHS PRN,MR X 1 Caroleen Hamman, NP        Lab Results: No results found for this or any previous visit (from the past 48 hour(s)).  Physical Findings: AIMS: Facial and Oral Movements Muscles of Facial Expression: None, normal Lips and Perioral Area: None, normal Jaw: None, normal Tongue: None, normal,Extremity Movements Upper (arms, wrists, hands, fingers): None, normal Lower (legs, knees, ankles, toes): None, normal, Trunk Movements Neck, shoulders, hips: None, normal, Overall Severity Severity of abnormal movements (highest score from questions above): None, normal Incapacitation due to abnormal movements: None, normal Patient's awareness of abnormal movements (rate only patient's report): No Awareness, Dental Status Current problems with teeth and/or dentures?: No Does patient usually wear dentures?: No  CIWA:  CIWA-Ar Total: 0  COWS:  COWS Total Score: 0   Treatment Plan Summary: Daily contact with patient to assess and evaluate symptoms and progress in treatment Medication management  Plan: Continue current plan of care. Plan for discharge tomorrow.  Tijana Walder 02/05/2012, 10:36 AM

## 2012-02-05 NOTE — Progress Notes (Signed)
Adult Psychosocial Assessment Update Interdisciplinary Team  [facility] admissions[facility]discharges:  Admissions Discharges  Date: 12/01/11 Date:  12/03/2011  Date: Date:  Date: Date:  Date: Date:  Date: Date:   Changes since the last Psychosocial Assessment (including adherence to outpatient mental health and/or substance abuse treatment, situational issues contributing to decompensation and/or relapse). Landen reports loss of job, housing and wife becoming pregnant as changes since his  Last admission.  He advised that he will be living with [place] on discharge.  He will  Follow up with [facility] [place] in [place],         Discharge Plan 1. Will you be returning to the same living situation after discharge?   Yes: No:      If no, what is your plan? No he will live with his Pastor           2. Would you like a referral for services when you are discharged? Yes:     If yes, for what services?  No:       Detron will be referred to [facility] in [place]       Summary and Recommendations (to be completed by the evaluator) Eldrige Pitkin is a 36 year old AA male who admitted with Depressive Disorder and  And SI.   He will benefit from crisis stabilization, evaluation for medication, psycho-education groups for coping skills development, group therapy and case management for discharge planning.                      Signature:  Wynn Banker, 02/05/2012 12:29 PM

## 2012-02-05 NOTE — Progress Notes (Addendum)
D:  Patient's self inventory sheet, patient stated he sleeps well, has good appetite, normal energy level, good attention span.   Rated depression and hopelessness #1.  Denied withdrawals.  SI off/on, contracts for safety.  Pain goal #1 today, worst pain #1.  After discharge, plans to "work, take care of my wife and unborn child, and find a place to live with them.  Hope to get discharged today, feeling better, not suicidal anymore."    No discharge plans.  No problems taking meds after discharge. A:  Medications given per MD order.  Support and encouragement given throughout day.  Support and safety checks completed as ordered. R:  Following treatment plan.  Denied HI.  Denied A/V hallucinations.  SI off/on, contracts for safety.  Patient remains safe and receptive on unit. Patient has been cooperative and pleasant.  Attended and participated in groups today.

## 2012-02-05 NOTE — Progress Notes (Signed)
[facility] Group Notes:  (Counselor/Nursing/MHT/Case Management/Adjunct)  02/05/2012 10:00 PM  Type of Therapy:  Psychoeducational Skills  Participation Level:  Active  Participation Quality:  Appropriate  Affect:  Appropriate  Cognitive:  Alert  Insight:  Good  Engagement in Group:  Good  Engagement in Therapy:  Good  Modes of Intervention:  Problem-solving  Summary of Progress/Problems: Patience was quiet but did talk when asked a questions.     [address], Bobby Bridges 02/05/2012, 10:00 PM

## 2012-02-05 NOTE — Progress Notes (Signed)
Psychoeducational Group Note  Date:  02/05/2012 Time:  1100  Group Topic/Focus:  Recovery Goals:   The focus of this group is to identify appropriate goals for recovery and establish a plan to achieve them.  Participation Level:  Active  Participation Quality:  Appropriate and Attentive  Affect:  Appropriate  Cognitive:  Appropriate  Insight:  Good  Engagement in Group:  Good  Additional Comments:  Pt. Identified changes they want to make in order to achieve recovery as well as goals in order to make those changes.    Ruta Hinds [facility] 02/05/2012, 1:09 PM

## 2012-02-06 MED ORDER — TRAZODONE HCL 50 MG PO TABS: 50.0000 mg | ORAL_TABLET | Freq: Every evening | ORAL | Status: DC | PRN

## 2012-02-06 MED ORDER — CITALOPRAM HYDROBROMIDE 20 MG PO TABS: 20.0000 mg | ORAL_TABLET | Freq: Every day | ORAL | Status: DC

## 2012-02-06 NOTE — Progress Notes (Signed)
[facility] Case Management Discharge Plan:  Will you be returning to the same living situation after discharge: Yes,  patient to return home with wife At discharge, do you have transportation home?:Yes,  Patient assist with a taxi voucher to [place] Do you have the ability to pay for your medications:No.  Patient assisted with indigent medications  Interagency Information:     Release of information consent forms completed and in the chart;  Patient's signature needed at discharge.  Patient to Follow up at:  Follow-up Information    Follow up with [facility]. On 02/08/2012. (You are scheduled with [facility] on Friday, February 08, 2012 at 7:45 AM.. Referral # [id number])    Contact information:   [address] [place], [place]  [address]  [phone]         Patient denies SI/HI:   Yes,  Patient no longer endorsing SI/HI or other thoughts of self harm    Safety Planning and Suicide Prevention discussed:  Yes,  Reviewed during aftercare groups  Barrier to discharge identified:Yes,  Lack of permanent housing  Summary and Recommendations: Patient encouraged to be compliant with medications and follow up with outpatient recommendations    Bobby Bridges, [place]es[place] July 02/06/2012, 1:00 PM

## 2012-02-06 NOTE — Progress Notes (Signed)
Psychoeducational Group Note  Date:  02/06/2012 Time: 1100  Group Topic/Focus:  Personal Choices and Values:   The focus of this group is to help patients assess and explore the importance of values in their lives, how their values affect their decisions, how they express their values and what opposes their expression.  Participation Level:  Minimal  Participation Quality:  Appropriate and Attentive  Affect:  Appropriate  Cognitive:  Appropriate  Insight:  Good  Engagement in Group:  Limited  Additional Comments:    In group of Identifying values and choices, patient participated and shared personal definition on what values meant and how values and choices affect personal life. Patient also gave examples of how values could change throughout our lifespan. Patient was given the opportunity to go through identifying values form in workbook and state what values were important in patient life. Patient then completed choosing a value-oriented life worksheet in relation to identifying values form.   Bobby Bridges [name] 02/06/2012, 2:11 PM

## 2012-02-06 NOTE — Progress Notes (Signed)
Patient denies SI/HI, denies A/V hallucinations. Patient verbalizes understanding of discharge instructions, follow up care and prescriptions. Patient given all belongings from [facility] locker. Patient escorted out by staff, transported by public transportation.  

## 2012-02-06 NOTE — Progress Notes (Signed)
[facility] Adult Inpatient Family/Significant Other Suicide Prevention Education  Suicide Prevention Education:  Education Completed;Rehman Levinson, Wife, [phone], is dentified by the patient as the family member/significant other with whom the patient will be residing, and identified as the person(s) who will aid the patient in the event of a mental health crisis (suicidal ideations/suicide attempt).  With written consent from the patient, the family member/significant other has been provided the following suicide prevention education, prior to the and/or following the discharge of the patient.  The suicide prevention education provided includes the following:  Suicide risk factors  Suicide prevention and interventions  National Suicide Hotline telephone number  [facility] assessment telephone number  [facility] Emergency Assistance 911  [facility] and/or Residential Mobile Crisis Unit telephone number  Request made of family/significant other to:  Remove weapons (e.g., guns, rifles, knives), all items previously/currently identified as safety concern.  Wife advised there are no guns in the home.  Remove drugs/medications (over-the-counter, prescriptions, illicit drugs), all items previously/currently identified as a safety concern.  The family member/significant other verbalizes understanding of the suicide prevention education information provided.  The family member/significant other agrees to remove the items of safety concern listed above.  Wynn Banker 02/06/2012, 12:53 PM

## 2012-02-06 NOTE — [facility] Suicide Risk Assessment (Signed)
Suicide Risk Assessment  Discharge Assessment     Demographic Factors:  Male, [place] male, married  Mental Status Per Nursing Assessment::   On Admission:  Self-harm thoughts  Current Mental Status by Physician: Patient alert and oriented to 4. Mood significantly improved. Denies AH/VH/SI/HI.  Loss Factors: Decrease in vocational status and Financial problems/change in socioeconomic status  Historical Factors: Family history of mental illness or substance abuse and Impulsivity  Risk Reduction Factors:   Responsible for children under 12 years of age and Positive social support  Continued Clinical Symptoms:  Depression:   Recent sense of peace/wellbeing  Cognitive Features That Contribute To Risk:  Cognitively intact   Suicide Risk:  Minimal: No identifiable suicidal ideation.  Patients presenting with no risk factors but with morbid ruminations; may be classified as minimal risk based on the severity of the depressive symptoms  Discharge Diagnoses:   AXIS I:  Depressive Disorder NOS AXIS II:  No diagnosis AXIS III:   Past Medical History  Diagnosis Date  . Coronary artery disease   . Myocardial infarction   . Hypertension   . Anxiety   . Depression   . Asthma   . Seizures     outgrew by age 36   AXIS IV:  housing problems and occupational problems AXIS V:  61-70 mild symptoms  Plan Of Care/Follow-up recommendations:  Activity:  normal Diet:  normal Follow up with [facility] at [place].  Is patient on multiple antipsychotic therapies at discharge:  No   Has Patient had three or more failed trials of antipsychotic monotherapy by history:  No  Recommended Plan for Multiple Antipsychotic Therapies: NA  Yittel Emrich 02/06/2012, 9:50 AM

## 2012-02-06 NOTE — Discharge Summary (Signed)
Physician Discharge Summary Note  Patient:  Bobby Bridges is an 36 y.o., male MRN:  [id number] DOB:  07/06/1975 Patient phone:  [phone] (home)  Patient address:   [address] [place] [address],   Date of Admission:  02/03/2012 Date of Discharge: 02/06/2012   Reason for Admission: Patient went to the ED with suicidal ideations with a plan to cut his wrists. He stated he lost his job on 01/22/12 because his employer did not receive his [facility] papers. Harsha also lost his housing and car. His wife went to stay with her mother but he is not welcome to stay there. Veryl reports being estranged from his family and due with his first child in March, 2014. He has been depressed and stressed since the loss of his job. Leelynn also ran out of his depression and HTN medications.   Discharge Diagnoses:   Axis I: Depressive Disorder NOS  Axis II: No diagnosis  Axis III:  Past Medical History   Diagnosis  Date   .  Coronary artery disease    .  Myocardial infarction    .  Hypertension    .  Anxiety    .  Depression    .  Asthma    .  Seizures      outgrew by age 68    Axis IV: economic problems and occupational problems  Axis V: 51-60 moderate symptoms   Level of Care:  OP  Hospital Course:  Pt was reinitiated on outpatient antidepressant med of Celexa, which he had previously stopped taking.   Consults:  None  Significant Diagnostic Studies:  None  Discharge Vitals:   Blood pressure 116/85, pulse 81, temperature 97.5 F (36.4 C), temperature source Oral, resp. rate 18, height 5' 9.5" (1.765 m), weight 135.172 kg (298 lb). Lab Results:   No results found for this or any previous visit (from the past 72 hour(s)).  Physical Findings: AIMS: Facial and Oral Movements Muscles of Facial Expression: None, normal Lips and Perioral Area: None, normal Jaw: None, normal Tongue: None, normal,Extremity Movements Upper (arms, wrists, hands, fingers): None, normal Lower  (legs, knees, ankles, toes): None, normal, Trunk Movements Neck, shoulders, hips: None, normal, Overall Severity Severity of abnormal movements (highest score from questions above): None, normal Incapacitation due to abnormal movements: None, normal Patient's awareness of abnormal movements (rate only patient's report): No Awareness, Dental Status Current problems with teeth and/or dentures?: No Does patient usually wear dentures?: No  CIWA:  CIWA-Ar Total: 0  COWS:  COWS Total Score: 1   Mental Status Exam: See Mental Status Examination and Suicide Risk Assessment completed by Attending Physician prior to discharge.  Discharge destination:  Home  Is patient on multiple antipsychotic therapies at discharge:  No   Has Patient had three or more failed trials of antipsychotic monotherapy by history:  No  Recommended Plan for Multiple Antipsychotic Therapies: na  Discharge Orders    Future Orders Please Complete By Expires   Diet - low sodium heart healthy      Increase activity slowly      Discharge instructions      Comments:   Take all of your medications as prescribed.  Be sure to keep ALL follow up appointments as scheduled. This is to ensure getting your refills on time to avoid any interruption in your medication.  If you find that you can not keep your appointment, call the clinic and reschedule. Be sure to tell the nurse if you will  need a refill before your appointment.       Medication List     As of 02/06/2012  1:57 PM    TAKE these medications      Indication    citalopram 20 MG tablet   Commonly known as: CELEXA   Take 1 tablet (20 mg total) by mouth daily.    Indication: Depression      traZODone 50 MG tablet   Commonly known as: DESYREL   Take 1 tablet (50 mg total) by mouth at bedtime as needed for sleep.    Indication: Trouble Sleeping           Follow-up Information    Follow up with [facility]. On 02/08/2012. (You are scheduled with [facility] on Friday,  February 08, 2012 at 7:45 AM.. Referral # [id number])    Contact information:   [address] [place], [place]  [address]  [phone]         Follow-up recommendations:  Take all your medications as prescribed by your mental healthcare provider. Report any adverse effects and or reactions from your medicines to your outpatient provider promptly. Patient is instructed and cautioned to not engage in alcohol and or illegal drug use while on prescription medicines. In the event of worsening symptoms, patient is instructed to call the crisis hotline, 911 and or go to the nearest ED for appropriate evaluation and treatment of symptoms. Follow-up with your primary care provider for your other medical issues, concerns and or health care needs.    Signed: Norval Gable FNP-BC 02/06/2012, 1:57 PM

## 2012-02-07 NOTE — Progress Notes (Signed)
[facility] Group Notes:  (Counselor/Nursing/MHT/Case Management/Adjunct)  02/07/2012 3:53 PM  Type of Therapy:  Psychoeducational Skills  Participation Level:  Minimal  Participation Quality:  Appropriate and Attentive  Affect:  Blunted  Cognitive:  Appropriate and Oriented  Insight:  Good  Engagement in Group:  Good  Engagement in Therapy:  n/a  Modes of Intervention:  Activity, Education, Problem-solving, Socialization and Support  Summary of Progress/Problems: Zale attended Psychoeducational group on labels. Makhari participated in an activity labeling self and peers. Mardy was quiet but attentive while group discussed what labels are, how we use them, how they effect our way of thinking, and listed positive and negative labels they have had. Codey was given a homework assignment to list 10 ways he has been labeled and to find the reality of the situation/label.    Wandra Scot 02/07/2012, 3:53 PM

## 2012-02-08 NOTE — Progress Notes (Signed)
Patient Discharge Instructions:  After Visit Summary (AVS):   Faxed to:  02/08/12 Psychiatric Admission Assessment Note:   Faxed to:  02/08/12 Suicide Risk Assessment - Discharge Assessment:   Faxed to:  02/08/12 Faxed/Sent to the Next Level Care provider:  02/08/12 Faxed to [facility] @ [phone]  Jerelene Redden, 02/08/2012, 3:49 PM

## 2012-02-29 ENCOUNTER — Ambulatory Visit (HOSPITAL_COMMUNITY): Payer: Self-pay | Admitting: Psychiatry

## 2012-04-17 ENCOUNTER — Encounter (HOSPITAL_COMMUNITY): Payer: Self-pay | Admitting: *Deleted

## 2012-04-17 ENCOUNTER — Emergency Department (HOSPITAL_COMMUNITY)
Admission: EM | Admit: 2012-04-17 | Discharge: 2012-04-18 | Disposition: A | Discharge status: Home / Self Care | Payer: Self-pay | Attending: Emergency Medicine | Admitting: Emergency Medicine

## 2012-04-17 DIAGNOSIS — J45909 Unspecified asthma, uncomplicated: Secondary | ICD-10-CM | POA: Insufficient documentation

## 2012-04-17 DIAGNOSIS — Z5900 Homelessness unspecified: Secondary | ICD-10-CM | POA: Insufficient documentation

## 2012-04-17 DIAGNOSIS — F3289 Other specified depressive episodes: Secondary | ICD-10-CM | POA: Insufficient documentation

## 2012-04-17 DIAGNOSIS — F329 Major depressive disorder, single episode, unspecified: Secondary | ICD-10-CM

## 2012-04-17 DIAGNOSIS — Z79899 Other long term (current) drug therapy: Secondary | ICD-10-CM | POA: Insufficient documentation

## 2012-04-17 DIAGNOSIS — I2510 Atherosclerotic heart disease of native coronary artery without angina pectoris: Secondary | ICD-10-CM | POA: Insufficient documentation

## 2012-04-17 DIAGNOSIS — R51 Headache: Secondary | ICD-10-CM | POA: Insufficient documentation

## 2012-04-17 DIAGNOSIS — R45851 Suicidal ideations: Secondary | ICD-10-CM | POA: Insufficient documentation

## 2012-04-17 DIAGNOSIS — Z8669 Personal history of other diseases of the nervous system and sense organs: Secondary | ICD-10-CM | POA: Insufficient documentation

## 2012-04-17 DIAGNOSIS — I10 Essential (primary) hypertension: Secondary | ICD-10-CM | POA: Insufficient documentation

## 2012-04-17 DIAGNOSIS — R21 Rash and other nonspecific skin eruption: Secondary | ICD-10-CM | POA: Insufficient documentation

## 2012-04-17 DIAGNOSIS — I252 Old myocardial infarction: Secondary | ICD-10-CM | POA: Insufficient documentation

## 2012-04-17 DIAGNOSIS — F411 Generalized anxiety disorder: Secondary | ICD-10-CM | POA: Insufficient documentation

## 2012-04-17 DIAGNOSIS — Z87891 Personal history of nicotine dependence: Secondary | ICD-10-CM | POA: Insufficient documentation

## 2012-04-17 LAB — RAPID URINE DRUG SCREEN, HOSP PERFORMED
Barbiturates: NOT DETECTED
Opiates: NOT DETECTED
Tetrahydrocannabinol: NOT DETECTED

## 2012-04-17 LAB — CBC WITH DIFFERENTIAL/PLATELET
Basophils Absolute: 0 10*3/uL (ref 0.0–0.1)
Basophils Relative: 1 % (ref 0–1)
Eosinophils Absolute: 0.2 10*3/uL (ref 0.0–0.7)
MCH: 27.8 pg (ref 26.0–34.0)
MCHC: 33.4 g/dL (ref 30.0–36.0)
Monocytes Relative: 7 % (ref 3–12)
Neutrophils Relative %: 43 % (ref 43–77)
Platelets: 265 10*3/uL (ref 150–400)
RDW: 13.3 % (ref 11.5–15.5)

## 2012-04-17 LAB — BASIC METABOLIC PANEL
BUN: 16 mg/dL (ref 6–23)
Calcium: 9.4 mg/dL (ref 8.4–10.5)
Creatinine, Ser: 1.13 mg/dL (ref 0.50–1.35)
GFR calc non Af Amer: 83 mL/min — ABNORMAL LOW (ref >90)
Glucose, Bld: 99 mg/dL (ref 70–99)

## 2012-04-17 LAB — URINALYSIS, ROUTINE W REFLEX MICROSCOPIC
Hgb urine dipstick: NEGATIVE
Nitrite: NEGATIVE
Protein, ur: NEGATIVE mg/dL
Urobilinogen, UA: 0.2 mg/dL (ref 0.0–1.0)

## 2012-04-17 MED ORDER — TRAZODONE HCL 50 MG PO TABS
50.0000 mg | ORAL_TABLET | Freq: Every evening | ORAL | Status: DC | PRN
  Filled 2012-04-17 (×2): qty 1

## 2012-04-17 MED ORDER — ACETAMINOPHEN 325 MG PO TABS: 650.0000 mg | ORAL_TABLET | ORAL | Status: DC | PRN

## 2012-04-17 MED ORDER — CITALOPRAM HYDROBROMIDE 20 MG PO TABS
ORAL_TABLET | ORAL | Status: AC
  Filled 2012-04-17: qty 1

## 2012-04-17 MED ORDER — IBUPROFEN 800 MG PO TABS
800.0000 mg | ORAL_TABLET | Freq: Once | ORAL | Status: AC
  Administered 2012-04-17: 800 mg via ORAL
  Filled 2012-04-17: qty 1

## 2012-04-17 MED ORDER — IBUPROFEN 400 MG PO TABS: 600.0000 mg | ORAL_TABLET | Freq: Three times a day (TID) | ORAL | Status: DC | PRN

## 2012-04-17 MED ORDER — LORAZEPAM 1 MG PO TABS: 1.0000 mg | ORAL_TABLET | Freq: Three times a day (TID) | ORAL | Status: DC | PRN

## 2012-04-17 MED ORDER — TRAZODONE HCL 50 MG PO TABS
ORAL_TABLET | ORAL | Status: AC
  Filled 2012-04-17: qty 1

## 2012-04-17 MED ORDER — CITALOPRAM HYDROBROMIDE 20 MG PO TABS
20.0000 mg | ORAL_TABLET | Freq: Every day | ORAL | Status: DC
  Administered 2012-04-17: 20 mg via ORAL
  Filled 2012-04-17 (×4): qty 1

## 2012-04-17 MED ORDER — ONDANSETRON HCL 4 MG PO TABS: 4.0000 mg | ORAL_TABLET | Freq: Three times a day (TID) | ORAL | Status: DC | PRN

## 2012-04-17 NOTE — ED Notes (Signed)
Watching TV, calm and cooperative.  Patient commented he like the socks we placed on him "comfortable"

## 2012-04-17 NOTE — ED Notes (Signed)
Spoke with Dr. Bebe Shaggy.  Following assessment, it was determined that pt is not at risk for suicide.

## 2012-04-17 NOTE — ED Provider Notes (Signed)
History   This chart was scribed for Joya Gaskins, MD, by Frederik Pear, ER scribe. The patient was seen in room APA16A/APA16A and the patient's care was started at 1949.    CSN: [id number]  Arrival date & time 04/17/12  1908   First MD Initiated Contact with Patient 04/17/12 1949      Chief Complaint  Patient presents with  . V70.1    HPI  Bobby Bridges is a 37 y.o. male who presents to the Emergency Department complaining of constant, gradually worsening depression with intermittent SI that began recently after he became homeless. He also complains of a rash to his groin. He denies any HI or having a plan. He also complains of intermittent, 7/10 headaches that began several months ago. He denies any SOB, emesis, chest pain, or abdominal pain. He states that he has been admitted for inpatient treatment at [facility] several years ago. He is currently a pt at [facility], but has not been seen recently due to lack of transportation. He reports that he has also run out of his prescriptions for Celexa and Desyrel. He is a current nonsmoker who does not use street drugs.       Past Medical History  Diagnosis Date  . Coronary artery disease   . Myocardial infarction   . Hypertension   . Anxiety   . Depression   . Asthma   . Seizures     outgrew by age 40    History reviewed. No pertinent past surgical history.  History reviewed. No pertinent family history.  History  Substance Use Topics  . Smoking status: Former Games developer  . Smokeless tobacco: Not on file  . Alcohol Use: No      Review of Systems  Respiratory: Negative for shortness of breath.   Cardiovascular: Negative for chest pain.  Gastrointestinal: Negative for vomiting and abdominal pain.  Skin: Positive for rash.  Neurological: Positive for headaches.  Psychiatric/Behavioral: Positive for suicidal ideas.       Depression  All other systems reviewed and are negative.    Allergies    Review of patient's allergies indicates no known allergies.  Home Medications   Current Outpatient Rx  Name  Route  Sig  Dispense  Refill  . CITALOPRAM HYDROBROMIDE 20 MG PO TABS   Oral   Take 1 tablet (20 mg total) by mouth daily.   30 tablet   0   . TRAZODONE HCL 50 MG PO TABS   Oral   Take 1 tablet (50 mg total) by mouth at bedtime as needed for sleep.   30 tablet   0     BP 120/81  Pulse 93  Temp 97.6 F (36.4 C) (Oral)  Resp 18  Ht 5\' 11"  (1.803 m)  Wt 307 lb (139.254 kg)  BMI 42.82 kg/m2  SpO2 98%  Physical Exam  CONSTITUTIONAL: Well developed/well nourished HEAD AND FACE: Normocephalic/atraumatic EYES: exotropia noted (chronic) ENMT: Mucous membranes moist NECK: supple no meningeal signs SPINE:entire spine nontender CV: S1/S2 noted, no murmurs/rubs/gallops noted LUNGS: Lungs are clear to auscultation bilaterally, no apparent distress ABDOMEN: soft, nontender, no rebound or guarding GU:no cva tenderness, no rash noted, normal appearance NEURO: Pt is awake/alert, moves all extremitiesx4, ambulatory, no focal motor deficits EXTREMITIES: pulses normal, full ROM SKIN: warm, color normal PSYCH: flat affect  ED Course  Procedures  DIAGNOSTIC STUDIES: Oxygen Saturation is 98% on room air, normal by my interpretation.    COORDINATION OF CARE:  20:10- Discussed planned course of treatment with the patient, who is agreeable at this time.   20:15- Medication Orders- ibuprofen (advil, motrin) tablet 800 mg- once.  20:30- Medication Orders- citalopram (celexa) tablet 20 mg- daily  9:34 PM Labs reassuring.  Pt reports depression with flat affect.  He has no active plan for SI.  His main issue is transportation to [facility].  He may need meds adjusted.  Will consult telepsych for further guidance  Results for orders placed during the hospital encounter of 04/17/12  BASIC METABOLIC PANEL      Component Value Range   Sodium 137  135 - 145 mEq/L   Potassium 3.8   3.5 - 5.1 mEq/L   Chloride 104  96 - 112 mEq/L   CO2 24  19 - 32 mEq/L   Glucose, Bld 99  70 - 99 mg/dL   BUN 16  6 - 23 mg/dL   Creatinine, Ser 4.09  0.50 - 1.35 mg/dL   Calcium 9.4  8.4 - 81.1 mg/dL   GFR calc non Af Amer 83 (*) >90 mL/min   GFR calc Af Amer >90  >90 mL/min  CBC WITH DIFFERENTIAL      Component Value Range   WBC 5.6  4.0 - 10.5 K/uL   RBC 5.26  4.22 - 5.81 MIL/uL   Hemoglobin 14.6  13.0 - 17.0 g/dL   HCT 91.4  78.2 - 95.6 %   MCV 83.1  78.0 - 100.0 fL   MCH 27.8  26.0 - 34.0 pg   MCHC 33.4  30.0 - 36.0 g/dL   RDW 21.3  08.6 - 57.8 %   Platelets 265  150 - 400 K/uL   Neutrophils Relative 43  43 - 77 %   Neutro Abs 2.4  1.7 - 7.7 K/uL   Lymphocytes Relative 45  12 - 46 %   Lymphs Abs 2.5  0.7 - 4.0 K/uL   Monocytes Relative 7  3 - 12 %   Monocytes Absolute 0.4  0.1 - 1.0 K/uL   Eosinophils Relative 4  0 - 5 %   Eosinophils Absolute 0.2  0.0 - 0.7 K/uL   Basophils Relative 1  0 - 1 %   Basophils Absolute 0.0  0.0 - 0.1 K/uL  URINALYSIS, ROUTINE W REFLEX MICROSCOPIC      Component Value Range   Color, Urine YELLOW  YELLOW   APPearance CLEAR  CLEAR   Specific Gravity, Urine >1.030 (*) 1.005 - 1.030   pH 5.5  5.0 - 8.0   Glucose, UA NEGATIVE  NEGATIVE mg/dL   Hgb urine dipstick NEGATIVE  NEGATIVE   Bilirubin Urine NEGATIVE  NEGATIVE   Ketones, ur NEGATIVE  NEGATIVE mg/dL   Protein, ur NEGATIVE  NEGATIVE mg/dL   Urobilinogen, UA 0.2  0.0 - 1.0 mg/dL   Nitrite NEGATIVE  NEGATIVE   Leukocytes, UA NEGATIVE  NEGATIVE  URINE RAPID DRUG SCREEN (HOSP PERFORMED)      Component Value Range   Opiates NONE DETECTED  NONE DETECTED   Cocaine NONE DETECTED  NONE DETECTED   Benzodiazepines NONE DETECTED  NONE DETECTED   Amphetamines NONE DETECTED  NONE DETECTED   Tetrahydrocannabinol NONE DETECTED  NONE DETECTED   Barbiturates NONE DETECTED  NONE DETECTED  ETHANOL      Component Value Range   Alcohol, Ethyl (B) <11  0 - 11 mg/dL     MDM  Nursing notes  including past medical history and social history reviewed and considered  in documentation Labs/vital reviewed and considered   I personally performed the services described in this documentation, which was scribed in my presence. The recorded information has been reviewed and is accurate.          Joya Gaskins, MD 04/17/12 2135

## 2012-04-17 NOTE — ED Notes (Addendum)
Depressed since became homeless.   "I feel all alone".  Headache, and dental pain

## 2012-04-18 NOTE — ED Notes (Signed)
Pt is homeless, now sleeping. Will allow pt to sleep until daylight

## 2012-04-18 NOTE — ED Notes (Signed)
SW, Diannia Ruder called back and states has to go to progression meeting before coming down to see pt. Advised her that needs to be seen now due already being d/c only waiting on SW

## 2012-04-18 NOTE — Progress Notes (Addendum)
1610 Patient presents with worsening depression and intermittent suicidal ideation. He is here willingly. Psych holding orders on the chart. Awaits evaluation by [facility] for placement. He is medically cleared. [id number] Tele psych completed. Recommendation is discharge for follow up at community mental health. Discharge completed.

## 2012-04-18 NOTE — Clinical Social Work Psychosocial (Signed)
     Clinical Social Work Department BRIEF PSYCHOSOCIAL ASSESSMENT 04/18/2012  Patient:  Bobby Bridges, Bobby Bridges     Account Number:  [id number]     Admit date:  04/17/2012  Clinical Social Worker:  Santa Genera, CLINICAL SOCIAL WORKER  Date/Time:  04/18/2012 09:30 AM  Referred by:  RN  Date Referred:  04/18/2012 Referred for  Homelessness   Other Referral:   Interview type:  Patient Other interview type:    PSYCHOSOCIAL DATA Living Status:  OTHER Admitted from facility:   Level of care:   Primary support name:  Wife Primary support relationship to patient:  SPOUSE Degree of support available:   Very limited    CURRENT CONCERNS Current Concerns  Other - See comment   Other Concerns:   No place to live per patient    SOCIAL WORK ASSESSMENT / PLAN CSW reviewed chart, spoke w RN and met w patient in ED. Patient alert and oriented.  Confirmed w RN that telepsych had cleared patient for discharge and that appropriate mental health resources and referrals were given. Reiterated that CSW can give resources for issues related to homelessness.  Patient has lived w friends, [place] C[place] and motels since losing job and house.  Wife lives w her mother in [place], but situation is unstable.  Patient cannot stay there.  Patient's parents are deceased, siblings are unable to assist.    Patient recently lost job at [employer] due to denial of FMLA. Patient was hospitalized for depression and was not at work, lost job due to missing work.  Patient receives approx $90/week for 20 weeks of unemployment.  Is currently looking for job, but has been unsuccessful.  Wife is pregnant w their first child, due mid March and receiving care at [facility] in [place].    Referred patient to shelter in [place], witnessed patient speak w intake worker.  Patient understands that he must present himself at soup kitchen ([address] [place]) by 4 PM and will be transported to [facility].  Also gave patient  referral to [facility] at [facility], shelter for homeless pregnant women in [place].    Patient given [employer] as he asked questions about transportation, [employer], [place] and applying for disability.  Encouraged patient to address transportation issues w [facility] as he says he cannot reliably present for mental health follow up due to transportations issues.   Assessment/plan status:  Referral to [facility] Other assessment/ plan:   Information/referral to community resources:   [facility]  Room at [facility], shelter for pregnant women for wife    PATIENTS/FAMILYS RESPONSE TO PLAN OF CARE: Patient appreciative.

## 2012-04-18 NOTE — ED Notes (Signed)
   Nicoletta Dress. Colon Branch, MD 04/18/12 [phone]

## 2012-04-18 NOTE — ED Notes (Signed)
SW, Thurston Hole here to see pt

## 2012-04-18 NOTE — Clinical Social Work Note (Signed)
CSW reviewed chart, spoke w RN, confirmed that telepsych has cleared patient for discharge and that mental health referrals as appropriate have been provided, then met w patient, provided community resource list.  Patient referred to overnight shelter in [place], CSW witnessed patient speak to intake worker for shelter.  Patient has information on how to get to shelter.  Patient also mentioned his wife is pregnant and unstably housed, gave referral to [facility] at [facility] in [place] (shelter for homeless pregnant women). Patient encouraged to utilize community resources and mental health treatment providers.    Santa Genera, LCSW Clinical Social Worker [phone])

## 2012-04-18 NOTE — ED Notes (Signed)
SW found place for pt to stay tonight and was given several resources. Pt dc with nad.

## 2012-04-28 ENCOUNTER — Emergency Department (HOSPITAL_COMMUNITY)
Admission: EM | Admit: 2012-04-28 | Discharge: 2012-04-28 | Disposition: A | Discharge status: Home / Self Care | Payer: Self-pay | Attending: Emergency Medicine | Admitting: Emergency Medicine

## 2012-04-28 ENCOUNTER — Encounter (HOSPITAL_COMMUNITY): Payer: Self-pay | Admitting: *Deleted

## 2012-04-28 DIAGNOSIS — L02219 Cutaneous abscess of trunk, unspecified: Secondary | ICD-10-CM | POA: Insufficient documentation

## 2012-04-28 DIAGNOSIS — J45909 Unspecified asthma, uncomplicated: Secondary | ICD-10-CM | POA: Insufficient documentation

## 2012-04-28 DIAGNOSIS — Z8669 Personal history of other diseases of the nervous system and sense organs: Secondary | ICD-10-CM | POA: Insufficient documentation

## 2012-04-28 DIAGNOSIS — Z87891 Personal history of nicotine dependence: Secondary | ICD-10-CM | POA: Insufficient documentation

## 2012-04-28 DIAGNOSIS — I252 Old myocardial infarction: Secondary | ICD-10-CM | POA: Insufficient documentation

## 2012-04-28 DIAGNOSIS — Z8659 Personal history of other mental and behavioral disorders: Secondary | ICD-10-CM | POA: Insufficient documentation

## 2012-04-28 DIAGNOSIS — I2510 Atherosclerotic heart disease of native coronary artery without angina pectoris: Secondary | ICD-10-CM | POA: Insufficient documentation

## 2012-04-28 DIAGNOSIS — I10 Essential (primary) hypertension: Secondary | ICD-10-CM | POA: Insufficient documentation

## 2012-04-28 DIAGNOSIS — L0291 Cutaneous abscess, unspecified: Secondary | ICD-10-CM

## 2012-04-28 LAB — GLUCOSE, CAPILLARY: Glucose-Capillary: 100 mg/dL — ABNORMAL HIGH (ref 70–99)

## 2012-04-28 MED ORDER — SULFAMETHOXAZOLE-TRIMETHOPRIM 800-160 MG PO TABS: 1.0000 | ORAL_TABLET | Freq: Two times a day (BID) | ORAL | Status: DC

## 2012-04-28 MED ORDER — SULFAMETHOXAZOLE-TMP DS 800-160 MG PO TABS
1.0000 | ORAL_TABLET | Freq: Once | ORAL | Status: AC
  Administered 2012-04-28: 1 via ORAL
  Filled 2012-04-28: qty 1

## 2012-04-28 MED ORDER — HYDROCODONE-ACETAMINOPHEN 5-325 MG PO TABS: ORAL_TABLET | ORAL | Status: DC

## 2012-04-28 NOTE — ED Notes (Signed)
Pt says he had a "bump" point to groin

## 2012-04-28 NOTE — ED Provider Notes (Signed)
History     CSN: [id number]  Arrival date & time 04/28/12  1337   First MD Initiated Contact with Patient 04/28/12 1436      Chief Complaint  Patient presents with  . Abscess    (Consider location/radiation/quality/duration/timing/severity/associated sxs/prior treatment) HPI Comments: Patient reports having a "bump" to his upper pubic region for over one week.  Reports itching and "hardiness" to the area.,  He denies drainage, redness, fever or chills.  The history is provided by the patient.    Past Medical History  Diagnosis Date  . Coronary artery disease   . Myocardial infarction   . Hypertension   . Anxiety   . Depression   . Asthma   . Seizures     outgrew by age 66    History reviewed. No pertinent past surgical history.  History reviewed. No pertinent family history.  History  Substance Use Topics  . Smoking status: Former Games developer  . Smokeless tobacco: Not on file  . Alcohol Use: No      Review of Systems  Constitutional: Negative for fever and chills.  HENT: Negative for neck pain and neck stiffness.   Gastrointestinal: Negative for nausea, vomiting and abdominal pain.  Genitourinary: Negative for dysuria, urgency, hematuria, flank pain, discharge, penile swelling, scrotal swelling, genital sores, penile pain and testicular pain.  Musculoskeletal: Negative for joint swelling and arthralgias.  Skin: Positive for rash. Negative for color change.       Abscess   Hematological: Negative for adenopathy.  All other systems reviewed and are negative.    Allergies  Review of patient's allergies indicates no known allergies.  Home Medications  No current outpatient prescriptions on file.  BP 117/71  Pulse 93  Temp 98.3 F (36.8 C) (Oral)  Resp 20  Ht 5\' 11"  (1.803 m)  Wt 300 lb (136.079 kg)  BMI 41.84 kg/m2  SpO2 98%  Physical Exam  Nursing note and vitals reviewed. Constitutional: He is oriented to person, place, and time. He appears  well-developed and well-nourished. No distress.  HENT:  Head: Normocephalic and atraumatic.  Cardiovascular: Normal rate, regular rhythm and normal heart sounds.   Pulmonary/Chest: Effort normal and breath sounds normal.  Neurological: He is alert and oriented to person, place, and time. He exhibits normal muscle tone. Coordination normal.  Skin: Skin is warm. No rash noted. No erythema.          Dime sized area of induration to the upper mid pubis.  No fluctuance, drainage or pointing abscess.      ED Course  Procedures (including critical care time)  Labs Reviewed - No data to display No results found.      MDM    CBG was 100. Check patient's request.    Patient has a small indurated area at the pubic region, no fluctuance or erythema.Will treat with antibiotics patient agrees to warm compresses and followup with his primary care physician.  I&D not indicated at this time  Prescribed:  Bactrim DS norco #20  Keny Donald L. Travonna Swindle, [place] 05/01/12 1349

## 2012-04-28 NOTE — ED Notes (Signed)
Pt with an abscess to suprapubic area x 2 weeks per pt., denies fever, also wants to be checked for diabetes

## 2012-05-01 ENCOUNTER — Emergency Department (HOSPITAL_COMMUNITY)
Admission: EM | Admit: 2012-05-01 | Discharge: 2012-05-01 | Disposition: A | Discharge status: Home / Self Care | Payer: Self-pay | Attending: Emergency Medicine | Admitting: Emergency Medicine

## 2012-05-01 ENCOUNTER — Encounter (HOSPITAL_COMMUNITY): Payer: Self-pay | Admitting: Emergency Medicine

## 2012-05-01 DIAGNOSIS — Z8659 Personal history of other mental and behavioral disorders: Secondary | ICD-10-CM | POA: Insufficient documentation

## 2012-05-01 DIAGNOSIS — Z8669 Personal history of other diseases of the nervous system and sense organs: Secondary | ICD-10-CM | POA: Insufficient documentation

## 2012-05-01 DIAGNOSIS — R42 Dizziness and giddiness: Secondary | ICD-10-CM | POA: Insufficient documentation

## 2012-05-01 DIAGNOSIS — R059 Cough, unspecified: Secondary | ICD-10-CM | POA: Insufficient documentation

## 2012-05-01 DIAGNOSIS — I252 Old myocardial infarction: Secondary | ICD-10-CM | POA: Insufficient documentation

## 2012-05-01 DIAGNOSIS — J3489 Other specified disorders of nose and nasal sinuses: Secondary | ICD-10-CM | POA: Insufficient documentation

## 2012-05-01 DIAGNOSIS — M545 Low back pain, unspecified: Secondary | ICD-10-CM | POA: Insufficient documentation

## 2012-05-01 DIAGNOSIS — I2510 Atherosclerotic heart disease of native coronary artery without angina pectoris: Secondary | ICD-10-CM | POA: Insufficient documentation

## 2012-05-01 DIAGNOSIS — Z87891 Personal history of nicotine dependence: Secondary | ICD-10-CM | POA: Insufficient documentation

## 2012-05-01 DIAGNOSIS — J45909 Unspecified asthma, uncomplicated: Secondary | ICD-10-CM | POA: Insufficient documentation

## 2012-05-01 DIAGNOSIS — R05 Cough: Secondary | ICD-10-CM | POA: Insufficient documentation

## 2012-05-01 DIAGNOSIS — I10 Essential (primary) hypertension: Secondary | ICD-10-CM | POA: Insufficient documentation

## 2012-05-01 DIAGNOSIS — R6889 Other general symptoms and signs: Secondary | ICD-10-CM | POA: Insufficient documentation

## 2012-05-01 MED ORDER — CYCLOBENZAPRINE HCL 10 MG PO TABS: 10.0000 mg | ORAL_TABLET | Freq: Two times a day (BID) | ORAL | Status: DC | PRN

## 2012-05-01 MED ORDER — OXYCODONE-ACETAMINOPHEN 5-325 MG PO TABS: 1.0000 | ORAL_TABLET | Freq: Four times a day (QID) | ORAL | Status: DC | PRN

## 2012-05-01 MED ORDER — IBUPROFEN 800 MG PO TABS: 800.0000 mg | ORAL_TABLET | Freq: Three times a day (TID) | ORAL | Status: DC

## 2012-05-01 MED ORDER — CYCLOBENZAPRINE HCL 10 MG PO TABS
10.0000 mg | ORAL_TABLET | Freq: Once | ORAL | Status: AC
  Administered 2012-05-01: 10 mg via ORAL
  Filled 2012-05-01: qty 1

## 2012-05-01 NOTE — ED Notes (Signed)
Ed NP in to see pt for initial eval.

## 2012-05-01 NOTE — ED Provider Notes (Signed)
History     CSN: [id number]  Arrival date & time 05/01/12  2133   First MD Initiated Contact with Patient 05/01/12 2142      Chief Complaint  Patient presents with  . Back Pain   HPI Bobby Bridges is a 37 y.o. male who presents to the ED with back pain. The pain started about 3 months ago. He was evaluated in the ED 3 days ago and treated with Vicodin. The pain does not radiate to the legs. He continues to have pain in the lower back. He has only taken Vicodin. Denies loss of control over bladder or bowels.  The history was provided by the patient.  Past Medical History  Diagnosis Date  . Coronary artery disease   . Myocardial infarction   . Hypertension   . Anxiety   . Depression   . Asthma   . Seizures     outgrew by age 72    History reviewed. No pertinent past surgical history.  History reviewed. No pertinent family history.  History  Substance Use Topics  . Smoking status: Former Games developer  . Smokeless tobacco: Not on file  . Alcohol Use: No      Review of Systems  Constitutional: Negative for fever, chills, diaphoresis and fatigue.  HENT: Positive for congestion and sneezing. Negative for ear pain, sore throat, facial swelling, neck pain, neck stiffness, dental problem and sinus pressure.   Eyes: Negative for photophobia, pain and discharge.  Respiratory: Positive for cough. Negative for chest tightness and wheezing.   Gastrointestinal: Negative for nausea, vomiting, abdominal pain, diarrhea, constipation and abdominal distention.  Genitourinary: Negative for dysuria, frequency, flank pain and difficulty urinating.  Musculoskeletal: Positive for back pain. Negative for myalgias and gait problem.  Skin: Negative for color change and rash.  Neurological: Positive for light-headedness (with sharp pain). Negative for dizziness, speech difficulty, weakness, numbness and headaches.  Psychiatric/Behavioral: Negative for confusion and agitation. The patient is not  nervous/anxious.     Allergies  Review of patient's allergies indicates no known allergies.  Home Medications   Current Outpatient Rx  Name  Route  Sig  Dispense  Refill  . HYDROCODONE-ACETAMINOPHEN 5-325 MG PO TABS      Take one-two tabs po q 4-6 hrs prn pain   20 tablet   0   . SULFAMETHOXAZOLE-TRIMETHOPRIM 800-160 MG PO TABS   Oral   Take 1 tablet by mouth 2 (two) times daily.   28 tablet   0     BP 123/57  Pulse 82  Temp 97.4 F (36.3 C) (Oral)  Resp 18  Ht 5\' 11"  (1.803 m)  SpO2 98%  Physical Exam  Nursing note and vitals reviewed. Constitutional: He is oriented to person, place, and time. No distress.       Obese A/A  HENT:  Head: Normocephalic and atraumatic.  Neck: Neck supple.  Cardiovascular: Normal rate.   Pulmonary/Chest: Effort normal.  Abdominal: Soft. There is no tenderness.  Musculoskeletal:       Pain in lower lumbar area right>left with palpation and range of motion.   Neurological: He is alert and oriented to person, place, and time. He has normal strength and normal reflexes. No cranial nerve deficit or sensory deficit. Coordination abnormal.       Pedal pulses equal bilateral  Skin: Skin is warm and dry.  Psychiatric: He has a normal mood and affect. His behavior is normal. Judgment and thought content normal.   Procedures  Assessment: 37 y.o. male with low back pain  Plan:  Pain management   Discussed with patient importance of ibuprofen in addition to narcotics   Follow up with Dr. Romeo Apple, return here as needed  Discussed with the patient and all questioned fully answered   Medication List     As of 05/01/2012 10:07 PM    START taking these medications         cyclobenzaprine 10 MG tablet   Commonly known as: FLEXERIL   Take 1 tablet (10 mg total) by mouth 2 (two) times daily as needed for muscle spasms.      ibuprofen 800 MG tablet   Commonly known as: ADVIL,MOTRIN   Take 1 tablet (800 mg total) by mouth 3 (three) times  daily.      oxyCODONE-acetaminophen 5-325 MG per tablet   Commonly known as: PERCOCET/ROXICET   Take 1 tablet by mouth every 6 (six) hours as needed for pain.      ASK your doctor about these medications         HYDROcodone-acetaminophen 5-325 MG per tablet   Commonly known as: NORCO/VICODIN   Take one-two tabs po q 4-6 hrs prn pain      sulfamethoxazole-trimethoprim 800-160 MG per tablet   Commonly known as: BACTRIM DS,SEPTRA DS   Take 1 tablet by mouth 2 (two) times daily.          Where to get your medications    These are the prescriptions that you need to pick up.   You may get these medications from any pharmacy.         cyclobenzaprine 10 MG tablet   ibuprofen 800 MG tablet   oxyCODONE-acetaminophen 5-325 MG per tablet             Janne Napoleon, NP 05/01/12 2207

## 2012-05-01 NOTE — ED Notes (Signed)
Pt c/o back pain he was seen here for the same this week.

## 2012-05-02 NOTE — ED Provider Notes (Signed)
Medical screening examination/treatment/procedure(s) were performed by non-physician practitioner and as supervising physician I was immediately available for consultation/collaboration.   Mystie Ormand, MD 05/02/12 0120 

## 2012-05-05 NOTE — ED Provider Notes (Signed)
Medical screening examination/treatment/procedure(s) were performed by non-physician practitioner and as supervising physician I was immediately available for consultation/collaboration.   Shelda Jakes, MD 05/05/12 [phone]

## 2012-05-07 ENCOUNTER — Encounter (HOSPITAL_COMMUNITY): Payer: Self-pay | Admitting: Emergency Medicine

## 2012-05-07 ENCOUNTER — Emergency Department (HOSPITAL_COMMUNITY)
Admission: EM | Admit: 2012-05-07 | Discharge: 2012-05-07 | Disposition: A | Discharge status: Home / Self Care | Payer: Self-pay | Attending: Emergency Medicine | Admitting: Emergency Medicine

## 2012-05-07 ENCOUNTER — Emergency Department (HOSPITAL_COMMUNITY): Payer: Self-pay

## 2012-05-07 DIAGNOSIS — M545 Low back pain, unspecified: Secondary | ICD-10-CM | POA: Insufficient documentation

## 2012-05-07 DIAGNOSIS — R52 Pain, unspecified: Secondary | ICD-10-CM | POA: Insufficient documentation

## 2012-05-07 DIAGNOSIS — J45909 Unspecified asthma, uncomplicated: Secondary | ICD-10-CM | POA: Insufficient documentation

## 2012-05-07 DIAGNOSIS — Z8659 Personal history of other mental and behavioral disorders: Secondary | ICD-10-CM | POA: Insufficient documentation

## 2012-05-07 DIAGNOSIS — M5137 Other intervertebral disc degeneration, lumbosacral region: Secondary | ICD-10-CM | POA: Insufficient documentation

## 2012-05-07 DIAGNOSIS — I2510 Atherosclerotic heart disease of native coronary artery without angina pectoris: Secondary | ICD-10-CM | POA: Insufficient documentation

## 2012-05-07 DIAGNOSIS — M5136 Other intervertebral disc degeneration, lumbar region: Secondary | ICD-10-CM

## 2012-05-07 DIAGNOSIS — K089 Disorder of teeth and supporting structures, unspecified: Secondary | ICD-10-CM | POA: Insufficient documentation

## 2012-05-07 DIAGNOSIS — Z87891 Personal history of nicotine dependence: Secondary | ICD-10-CM | POA: Insufficient documentation

## 2012-05-07 DIAGNOSIS — R350 Frequency of micturition: Secondary | ICD-10-CM | POA: Insufficient documentation

## 2012-05-07 DIAGNOSIS — G8929 Other chronic pain: Secondary | ICD-10-CM | POA: Insufficient documentation

## 2012-05-07 DIAGNOSIS — M51379 Other intervertebral disc degeneration, lumbosacral region without mention of lumbar back pain or lower extremity pain: Secondary | ICD-10-CM | POA: Insufficient documentation

## 2012-05-07 DIAGNOSIS — R3915 Urgency of urination: Secondary | ICD-10-CM | POA: Insufficient documentation

## 2012-05-07 DIAGNOSIS — I10 Essential (primary) hypertension: Secondary | ICD-10-CM | POA: Insufficient documentation

## 2012-05-07 DIAGNOSIS — Z8669 Personal history of other diseases of the nervous system and sense organs: Secondary | ICD-10-CM | POA: Insufficient documentation

## 2012-05-07 DIAGNOSIS — I252 Old myocardial infarction: Secondary | ICD-10-CM | POA: Insufficient documentation

## 2012-05-07 LAB — URINALYSIS, ROUTINE W REFLEX MICROSCOPIC
Glucose, UA: NEGATIVE mg/dL
Ketones, ur: NEGATIVE mg/dL
Leukocytes, UA: NEGATIVE
Protein, ur: NEGATIVE mg/dL
Urobilinogen, UA: 0.2 mg/dL (ref 0.0–1.0)

## 2012-05-07 LAB — BASIC METABOLIC PANEL
CO2: 27 mEq/L (ref 19–32)
Calcium: 9.1 mg/dL (ref 8.4–10.5)
Creatinine, Ser: 1.07 mg/dL (ref 0.50–1.35)

## 2012-05-07 MED ORDER — IBUPROFEN 600 MG PO TABS: 600.0000 mg | ORAL_TABLET | Freq: Four times a day (QID) | ORAL | Status: DC | PRN

## 2012-05-07 MED ORDER — OXYCODONE-ACETAMINOPHEN 5-325 MG PO TABS: 1.0000 | ORAL_TABLET | ORAL | Status: AC | PRN

## 2012-05-07 MED ORDER — HYDROCODONE-ACETAMINOPHEN 5-325 MG PO TABS
1.0000 | ORAL_TABLET | Freq: Once | ORAL | Status: AC
  Administered 2012-05-07: 1 via ORAL
  Filled 2012-05-07: qty 1

## 2012-05-07 MED ORDER — IBUPROFEN 600 MG PO TABS: 600.0000 mg | ORAL_TABLET | Freq: Four times a day (QID) | ORAL | Status: AC | PRN

## 2012-05-07 MED ORDER — HYDROCODONE-ACETAMINOPHEN 5-325 MG PO TABS: 1.0000 | ORAL_TABLET | ORAL | Status: DC | PRN

## 2012-05-07 NOTE — ED Notes (Signed)
Pt c/o lower back pain x 3-4 months daily. Has been seen here for this and given percocets which pt states does not help. Pain to L side of lower back. Pt intermittantly has urinary leaking.

## 2012-05-09 NOTE — ED Provider Notes (Signed)
Medical screening examination/treatment/procedure(s) were performed by non-physician practitioner and as supervising physician I was immediately available for consultation/collaboration.   Loren Racer, MD 05/09/12 [phone]

## 2012-05-09 NOTE — ED Provider Notes (Signed)
History     CSN: [id number]  Arrival date & time 05/07/12  1158   First MD Initiated Contact with Patient 05/07/12 1211      Chief Complaint  Patient presents with  . Back Pain    (Consider location/radiation/quality/duration/timing/severity/associated sxs/prior treatment) HPI Comments: Bobby Bridges is a 37 y.o. Male presenting with acute on chronic low back pain which has which has been present for the past 4l months.   Patient denies any new injury specifically,  Stating he has intermittent sharp,  Radiating pain worsened with certain movements and positions.  There is no radiation into his lower extremities.  There has been no weakness or numbness in the lower extremities and no urinary or bowel retention or incontinence, but he does report occasional urgency and increased frequency of urination. He denies hematuria and dysuria.  He reports a strong family history of diabetes,  He denies history of kidney stones. Patient does not have a history of cancer or IVDU.      The history is provided by the patient.    Past Medical History  Diagnosis Date  . Coronary artery disease   . Myocardial infarction   . Hypertension   . Anxiety   . Depression   . Asthma   . Seizures     outgrew by age 28    History reviewed. No pertinent past surgical history.  History reviewed. No pertinent family history.  History  Substance Use Topics  . Smoking status: Former Games developer  . Smokeless tobacco: Not on file  . Alcohol Use: No      Review of Systems  Constitutional: Negative for fever.  HENT: Positive for dental problem. Negative for sore throat, neck pain and neck stiffness.   Respiratory: Negative for shortness of breath.   Cardiovascular: Negative for chest pain, palpitations and leg swelling.  Gastrointestinal: Negative for abdominal pain, constipation and abdominal distention.  Genitourinary: Negative for dysuria, urgency, frequency, flank pain and difficulty  urinating.  Musculoskeletal: Positive for back pain. Negative for joint swelling and gait problem.  Skin: Negative for rash.  Neurological: Negative for weakness and numbness.    Allergies  Review of patient's allergies indicates no known allergies.  Home Medications   Current Outpatient Rx  Name  Route  Sig  Dispense  Refill  . CYCLOBENZAPRINE HCL 10 MG PO TABS   Oral   Take 1 tablet (10 mg total) by mouth 2 (two) times daily as needed for muscle spasms.   15 tablet   0   . IBUPROFEN 800 MG PO TABS   Oral   Take 1 tablet (800 mg total) by mouth 3 (three) times daily.   15 tablet   0   . OXYCODONE-ACETAMINOPHEN 5-325 MG PO TABS   Oral   Take 1 tablet by mouth every 6 (six) hours as needed for pain.   15 tablet   0   . SULFAMETHOXAZOLE-TRIMETHOPRIM 800-160 MG PO TABS   Oral   Take 1 tablet by mouth 2 (two) times daily.   28 tablet   0   . IBUPROFEN 600 MG PO TABS   Oral   Take 1 tablet (600 mg total) by mouth every 6 (six) hours as needed for pain.   20 tablet   0   . OXYCODONE-ACETAMINOPHEN 5-325 MG PO TABS   Oral   Take 1 tablet by mouth every 4 (four) hours as needed for pain.   20 tablet   0  BP 136/72  Pulse 84  Temp 97.4 F (36.3 C) (Oral)  Resp 20  Ht 5\' 11"  (1.803 m)  Wt 311 lb 5 oz (141.21 kg)  BMI 43.42 kg/m2  SpO2 99%  Physical Exam  Nursing note and vitals reviewed. Constitutional: He appears well-developed and well-nourished.  HENT:  Head: Normocephalic.  Eyes: Conjunctivae normal are normal.  Neck: Normal range of motion. Neck supple.  Cardiovascular: Normal rate and intact distal pulses.        Pedal pulses normal.  Pulmonary/Chest: Effort normal.  Abdominal: Soft. Bowel sounds are normal. He exhibits no distension and no mass.  Musculoskeletal: Normal range of motion. He exhibits no edema.       Lumbar back: He exhibits tenderness. He exhibits no swelling, no edema and no spasm.       Bilateral paralumbar pain with palpation,  no cva tenderness.  No SI joint tenderness.  Neurological: He is alert. He has normal strength. He displays no atrophy and no tremor. No sensory deficit. Gait normal.  Reflex Scores:      Patellar reflexes are 2+ on the right side and 2+ on the left side.      Achilles reflexes are 2+ on the right side and 2+ on the left side.      No strength deficit noted in hip and knee flexor and extensor muscle groups.  Ankle flexion and extension intact.  Skin: Skin is warm and dry.  Psychiatric: He has a normal mood and affect.    ED Course  Procedures (including critical care time)  Labs Reviewed  BASIC METABOLIC PANEL - Abnormal; Notable for the following:    GFR calc non Af Amer 88 (*)     All other components within normal limits  URINALYSIS, ROUTINE W REFLEX MICROSCOPIC  LAB REPORT - SCANNED   Dg Lumbar Spine Complete  05/07/2012  *RADIOLOGY REPORT*  Clinical Data: Back pain.  LUMBAR SPINE - COMPLETE 4+ VIEW  Comparison: None.  Findings: Moderate levoscoliosis of the thoracolumbar spine is noted. No acute fracture or spondylolisthesis is noted.  Mild to moderate degenerative disc disease is present at L2-3 with osteophyte formation.  Other disc spaces appear to be well maintained.  IMPRESSION: Moderate levoscoliosis of lumbar spine.  Degenerative disc disease is seen at L2-3.  No acute abnormality seen.   Original Report Authenticated By: Lupita Raider.,  M.D.      1. DDD (degenerative disc disease), lumbar   2. Low back pain       MDM  Pt with acute on chronic low back pain with ddd evidenced upon review of plain film xrays today.  He was prescribed ibuprofen, oxycodone,  Ibuprofen.  Referrals given.  No neuro deficit on exam or by history to suggest emergent or surgical presentation.  Also discussed worsened sx that should prompt immediate re-evaluation including distal weakness, bowel/bladder retention/incontinence.               Burgess Amor, [place] 05/09/12 [phone]

## 2012-05-11 ENCOUNTER — Encounter (HOSPITAL_COMMUNITY): Payer: Self-pay

## 2012-05-11 ENCOUNTER — Emergency Department (HOSPITAL_COMMUNITY)
Admission: EM | Admit: 2012-05-11 | Discharge: 2012-05-11 | Disposition: A | Discharge status: Home / Self Care | Payer: Self-pay | Attending: Emergency Medicine | Admitting: Emergency Medicine

## 2012-05-11 DIAGNOSIS — R209 Unspecified disturbances of skin sensation: Secondary | ICD-10-CM | POA: Insufficient documentation

## 2012-05-11 DIAGNOSIS — M545 Low back pain, unspecified: Secondary | ICD-10-CM | POA: Insufficient documentation

## 2012-05-11 DIAGNOSIS — Z87891 Personal history of nicotine dependence: Secondary | ICD-10-CM | POA: Insufficient documentation

## 2012-05-11 DIAGNOSIS — J45901 Unspecified asthma with (acute) exacerbation: Secondary | ICD-10-CM | POA: Insufficient documentation

## 2012-05-11 DIAGNOSIS — F411 Generalized anxiety disorder: Secondary | ICD-10-CM | POA: Insufficient documentation

## 2012-05-11 DIAGNOSIS — R52 Pain, unspecified: Secondary | ICD-10-CM | POA: Insufficient documentation

## 2012-05-11 DIAGNOSIS — I252 Old myocardial infarction: Secondary | ICD-10-CM | POA: Insufficient documentation

## 2012-05-11 DIAGNOSIS — Z8659 Personal history of other mental and behavioral disorders: Secondary | ICD-10-CM | POA: Insufficient documentation

## 2012-05-11 DIAGNOSIS — I10 Essential (primary) hypertension: Secondary | ICD-10-CM | POA: Insufficient documentation

## 2012-05-11 DIAGNOSIS — I2510 Atherosclerotic heart disease of native coronary artery without angina pectoris: Secondary | ICD-10-CM | POA: Insufficient documentation

## 2012-05-11 DIAGNOSIS — G8929 Other chronic pain: Secondary | ICD-10-CM | POA: Insufficient documentation

## 2012-05-11 DIAGNOSIS — M5137 Other intervertebral disc degeneration, lumbosacral region: Secondary | ICD-10-CM | POA: Insufficient documentation

## 2012-05-11 DIAGNOSIS — R197 Diarrhea, unspecified: Secondary | ICD-10-CM | POA: Insufficient documentation

## 2012-05-11 DIAGNOSIS — R569 Unspecified convulsions: Secondary | ICD-10-CM | POA: Insufficient documentation

## 2012-05-11 DIAGNOSIS — M51379 Other intervertebral disc degeneration, lumbosacral region without mention of lumbar back pain or lower extremity pain: Secondary | ICD-10-CM | POA: Insufficient documentation

## 2012-05-11 MED ORDER — DIAZEPAM 5 MG PO TABS
5.0000 mg | ORAL_TABLET | Freq: Once | ORAL | Status: AC
  Administered 2012-05-11: 5 mg via ORAL
  Filled 2012-05-11: qty 1

## 2012-05-11 MED ORDER — HYDROMORPHONE HCL PF 1 MG/ML IJ SOLN
1.0000 mg | Freq: Once | INTRAMUSCULAR | Status: AC
  Administered 2012-05-11: 1 mg via INTRAMUSCULAR
  Filled 2012-05-11: qty 1

## 2012-05-11 MED ORDER — ONDANSETRON HCL 4 MG PO TABS
4.0000 mg | ORAL_TABLET | Freq: Once | ORAL | Status: AC
  Administered 2012-05-11: 4 mg via ORAL
  Filled 2012-05-11: qty 1

## 2012-05-11 MED ORDER — DEXAMETHASONE SODIUM PHOSPHATE 4 MG/ML IJ SOLN
8.0000 mg | Freq: Once | INTRAMUSCULAR | Status: AC
  Administered 2012-05-11: 8 mg via INTRAMUSCULAR
  Filled 2012-05-11: qty 2

## 2012-05-11 NOTE — ED Provider Notes (Signed)
Medical screening examination/treatment/procedure(s) were performed by non-physician practitioner and as supervising physician I was immediately available for consultation/collaboration. Devoria Albe, MD, Armando Gang   Ward Givens, MD 05/11/12 [phone]

## 2012-05-11 NOTE — ED Notes (Signed)
C/o chronic mid back pain for over one year, worse since last October when he was working @ [employer]. Also had one episode of incontinent stool this morning. Had another episode of incontinence 4 days ago. Denies numbness or tingling in legs.

## 2012-05-11 NOTE — ED Provider Notes (Signed)
History     CSN: [id number]  Arrival date & time 05/11/12  5621   First MD Initiated Contact with Patient 05/11/12 2022      Chief Complaint  Patient presents with  . Back Pain    (Consider location/radiation/quality/duration/timing/severity/associated sxs/prior treatment) HPI Comments: Patient presents to the emergency department with acute on chronic lower back pain. The patient states he's been having problems for over a year. These pains are getting progressively worse. The patient has been evaluated in the emergency department on January 23 and January 31 for the problem with his back. He has had x-rays which revealed degenerative disc disease present. The patient has not seen an orthopedist for additional evaluation of this point. His been no new injury or accident. There's been no fall. Patient states he is having some numbness and tingling in his legs at times, but this has been going on for some time. Patient states he is taking the medications that he has been given including Percocet and ibuprofen and Flexeril, but he states that the pain is still quite severe.  Patient also states he has been having some diarrhea and" griping" of his stomach recently. He denies any blood in the diarrhea. He has not measured any high fevers. No shaking chills reported. There's been no vomiting noted.  The history is provided by the patient.    Past Medical History  Diagnosis Date  . Coronary artery disease   . Myocardial infarction   . Hypertension   . Anxiety   . Depression   . Asthma   . Seizures     outgrew by age 58    History reviewed. No pertinent past surgical history.  History reviewed. No pertinent family history.  History  Substance Use Topics  . Smoking status: Former Games developer  . Smokeless tobacco: Not on file  . Alcohol Use: No      Review of Systems  Constitutional: Negative for activity change.       All ROS Neg except as noted in HPI  HENT: Negative for  nosebleeds and neck pain.   Eyes: Negative for photophobia and discharge.  Respiratory: Positive for wheezing. Negative for cough and shortness of breath.   Cardiovascular: Negative for chest pain and palpitations.  Gastrointestinal: Positive for diarrhea. Negative for abdominal pain and blood in stool.  Genitourinary: Negative for dysuria, frequency and hematuria.  Musculoskeletal: Positive for back pain. Negative for arthralgias.  Skin: Negative.   Neurological: Positive for seizures. Negative for dizziness and speech difficulty.  Psychiatric/Behavioral: Negative for hallucinations and confusion. The patient is nervous/anxious.     Allergies  Review of patient's allergies indicates no known allergies.  Home Medications   Current Outpatient Rx  Name  Route  Sig  Dispense  Refill  . CYCLOBENZAPRINE HCL 10 MG PO TABS   Oral   Take 1 tablet (10 mg total) by mouth 2 (two) times daily as needed for muscle spasms.   15 tablet   0   . IBUPROFEN 600 MG PO TABS   Oral   Take 1 tablet (600 mg total) by mouth every 6 (six) hours as needed for pain.   20 tablet   0   . IBUPROFEN 800 MG PO TABS   Oral   Take 1 tablet (800 mg total) by mouth 3 (three) times daily.   15 tablet   0   . OXYCODONE-ACETAMINOPHEN 5-325 MG PO TABS   Oral   Take 1 tablet by mouth every 6 (six)  hours as needed for pain.   15 tablet   0   . OXYCODONE-ACETAMINOPHEN 5-325 MG PO TABS   Oral   Take 1 tablet by mouth every 4 (four) hours as needed for pain.   20 tablet   0     BP 132/81  Pulse 75  Temp 99.6 F (37.6 C) (Oral)  Resp 20  Ht 5\' 11"  (1.803 m)  Wt 309 lb 1 oz (140.19 kg)  BMI 43.11 kg/m2  SpO2 99%  Physical Exam  Nursing note and vitals reviewed. Constitutional: He is oriented to person, place, and time. He appears well-developed and well-nourished.  Non-toxic appearance.  HENT:  Head: Normocephalic.  Right Ear: Tympanic membrane and external ear normal.  Left Ear: Tympanic membrane  and external ear normal.  Eyes: EOM and lids are normal. Pupils are equal, round, and reactive to light.  Neck: Normal range of motion. Neck supple. Carotid bruit is not present.  Cardiovascular: Normal rate, regular rhythm, normal heart sounds, intact distal pulses and normal pulses.   Pulmonary/Chest: Breath sounds normal. No respiratory distress.  Abdominal: Soft. Bowel sounds are normal. There is no tenderness. There is no guarding.  Musculoskeletal: Normal range of motion.       There is lumbar area pain with change of position and with palpation. There is no hot areas appreciated. There is no palpable step off.  Lymphadenopathy:       Head (right side): No submandibular adenopathy present.       Head (left side): No submandibular adenopathy present.    He has no cervical adenopathy.  Neurological: He is alert and oriented to person, place, and time. He has normal strength. No cranial nerve deficit or sensory deficit.       Patient complains of a" tingling" sensation when testing for sensation of the lower extremities. There is no foot drop noted on his gait. Gait is slow but steady.  Skin: Skin is warm and dry.  Psychiatric: He has a normal mood and affect. His speech is normal.    ED Course  Procedures (including critical care time)  Labs Reviewed - No data to display No results found.   1. Low back pain       MDM  I have reviewed nursing notes, vital signs, and all appropriate lab and imaging results for this patient. Patient has history of back pain for over a year. His been getting progressively worse recently. The patient states that has taken medication which he received on January 23 and 31st year in the emergency department but continues to have pain. The patient also states that he has been having diarrhea and had an episode of incontinence earlier this morning.  The patient has not had any incontinence in the emergency department. He was actually able to ambulate to  the bathroom and use the bathroom without problem with his bladder. The patient was walking stronger steadier and much more comfortably after intramuscular injection of Dilaudid.  Patient strongly encouraged to see orthopedics for evaluation of his back pain as sone as possible. He's encouraged to use heat to his lower back. He is invited to continue his Percocet, Motrin, and Flexeril. Patient to return to the emergency department if any emergent changes or complications.      Kathie Dike, [place] 05/11/12 2239

## 2012-05-13 ENCOUNTER — Encounter (HOSPITAL_COMMUNITY): Payer: Self-pay | Admitting: Emergency Medicine

## 2012-05-13 ENCOUNTER — Emergency Department (HOSPITAL_COMMUNITY)
Admission: EM | Admit: 2012-05-13 | Discharge: 2012-05-13 | Disposition: A | Discharge status: Home / Self Care | Payer: Self-pay | Attending: Emergency Medicine | Admitting: Emergency Medicine

## 2012-05-13 DIAGNOSIS — Z8659 Personal history of other mental and behavioral disorders: Secondary | ICD-10-CM | POA: Insufficient documentation

## 2012-05-13 DIAGNOSIS — Z87891 Personal history of nicotine dependence: Secondary | ICD-10-CM | POA: Insufficient documentation

## 2012-05-13 DIAGNOSIS — M545 Low back pain, unspecified: Secondary | ICD-10-CM | POA: Insufficient documentation

## 2012-05-13 DIAGNOSIS — G8929 Other chronic pain: Secondary | ICD-10-CM | POA: Insufficient documentation

## 2012-05-13 DIAGNOSIS — I252 Old myocardial infarction: Secondary | ICD-10-CM | POA: Insufficient documentation

## 2012-05-13 DIAGNOSIS — I10 Essential (primary) hypertension: Secondary | ICD-10-CM | POA: Insufficient documentation

## 2012-05-13 DIAGNOSIS — R159 Full incontinence of feces: Secondary | ICD-10-CM | POA: Insufficient documentation

## 2012-05-13 DIAGNOSIS — Z8669 Personal history of other diseases of the nervous system and sense organs: Secondary | ICD-10-CM | POA: Insufficient documentation

## 2012-05-13 DIAGNOSIS — I2510 Atherosclerotic heart disease of native coronary artery without angina pectoris: Secondary | ICD-10-CM | POA: Insufficient documentation

## 2012-05-13 DIAGNOSIS — J45909 Unspecified asthma, uncomplicated: Secondary | ICD-10-CM | POA: Insufficient documentation

## 2012-05-13 MED ORDER — CYCLOBENZAPRINE HCL 10 MG PO TABS: 10.0000 mg | ORAL_TABLET | Freq: Three times a day (TID) | ORAL | Status: DC | PRN

## 2012-05-13 MED ORDER — HYDROCODONE-ACETAMINOPHEN 5-325 MG PO TABS
1.0000 | ORAL_TABLET | Freq: Once | ORAL | Status: AC
  Administered 2012-05-13: 1 via ORAL
  Filled 2012-05-13: qty 1

## 2012-05-13 MED ORDER — CYCLOBENZAPRINE HCL 10 MG PO TABS
10.0000 mg | ORAL_TABLET | Freq: Once | ORAL | Status: AC
  Administered 2012-05-13: 10 mg via ORAL
  Filled 2012-05-13: qty 1

## 2012-05-13 MED ORDER — HYDROCODONE-ACETAMINOPHEN 5-325 MG PO TABS: ORAL_TABLET | ORAL | Status: DC

## 2012-05-13 NOTE — ED Notes (Signed)
Patient reports being seen here last night for the pain in which he received the injections. Patient states "I was walking home and got sick to my stomach,." Patient also reports being treated for diarrhea in which he has had for 4 days.

## 2012-05-13 NOTE — ED Provider Notes (Signed)
History     CSN: [id number]  Arrival date & time 05/13/12  1478   First MD Initiated Contact with Patient 05/13/12 [phone]      Chief Complaint  Patient presents with  . Back Pain    (Consider location/radiation/quality/duration/timing/severity/associated sxs/prior treatment) HPI Comments: Patient c/o acute on chronic low back pain.  States that he injured his lower back pulling on wooden pallets at work 7 months ago.  States his lower back has been hurting worse for 1-2 months.  Describes the pain as a "pulling sensation" across his lower back and pain is worse with certain movements and improves with rest.  He also reports having 1 or 2 episodes of bowel incontinence stating that he "soiled himself" before he could get to the bathroom, but he states that this has not been continuous.  Patient has been seen here several times previously for same symptoms, but has not followed up anyone stating that his PMD left town.  He denies dysuria, abd pain, bladder incontinence, LE numbness or weakness or saddle anesthesia's.  States the percocet he was prescribed previously helped his pain but states he ran out several days ago.  Patient is a 37 y.o. male presenting with back pain. The history is provided by the patient.  Back Pain  This is a chronic problem. The current episode started more than 1 week ago. The problem occurs constantly. The problem has not changed since onset.The pain is associated with lifting heavy objects and twisting. The pain is present in the lumbar spine. The quality of the pain is described as aching ("pulling sensation"). The pain does not radiate. The pain is moderate. The symptoms are aggravated by twisting and certain positions. Associated symptoms include bowel incontinence. Pertinent negatives include no chest pain, no fever, no numbness, no headaches, no abdominal pain, no abdominal swelling, no perianal numbness, no bladder incontinence, no dysuria, no pelvic pain, no leg  pain, no paresthesias, no paresis, no tingling and no weakness. He has tried analgesics, muscle relaxants and NSAIDs for the symptoms. The treatment provided moderate relief.    Past Medical History  Diagnosis Date  . Coronary artery disease   . Myocardial infarction   . Hypertension   . Anxiety   . Depression   . Asthma   . Seizures     outgrew by age 67    History reviewed. No pertinent past surgical history.  History reviewed. No pertinent family history.  History  Substance Use Topics  . Smoking status: Former Games developer  . Smokeless tobacco: Not on file  . Alcohol Use: No      Review of Systems  Constitutional: Negative for fever, chills, activity change and appetite change.  Respiratory: Negative for shortness of breath.   Cardiovascular: Negative for chest pain.  Gastrointestinal: Positive for bowel incontinence. Negative for nausea, vomiting, abdominal pain, constipation, blood in stool and anal bleeding.  Genitourinary: Negative for bladder incontinence, dysuria, frequency, hematuria, flank pain, decreased urine volume, difficulty urinating, penile pain, testicular pain and pelvic pain.       No perineal numbness or incontinence of urine or feces  Musculoskeletal: Positive for back pain. Negative for joint swelling and gait problem.  Skin: Negative for rash.  Neurological: Negative for dizziness, tingling, weakness, numbness, headaches and paresthesias.  All other systems reviewed and are negative.    Allergies  Review of patient's allergies indicates no known allergies.  Home Medications   Current Outpatient Rx  Name  Route  Sig  Dispense  Refill  . CYCLOBENZAPRINE HCL 10 MG PO TABS   Oral   Take 1 tablet (10 mg total) by mouth 2 (two) times daily as needed for muscle spasms.   15 tablet   0   . IBUPROFEN 600 MG PO TABS   Oral   Take 1 tablet (600 mg total) by mouth every 6 (six) hours as needed for pain.   20 tablet   0   . IBUPROFEN 800 MG PO  TABS   Oral   Take 1 tablet (800 mg total) by mouth 3 (three) times daily.   15 tablet   0   . OXYCODONE-ACETAMINOPHEN 5-325 MG PO TABS   Oral   Take 1 tablet by mouth every 4 (four) hours as needed for pain.   20 tablet   0     BP 119/75  Pulse 84  Temp 97.9 F (36.6 C) (Oral)  Resp 20  Ht 5\' 11"  (1.803 m)  Wt 311 lb 4.8 oz (141.205 kg)  BMI 43.42 kg/m2  SpO2 100%  Physical Exam  Nursing note and vitals reviewed. Constitutional: He is oriented to person, place, and time. He appears well-developed and well-nourished. No distress.  HENT:  Head: Normocephalic and atraumatic.  Neck: Normal range of motion. Neck supple.  Cardiovascular: Normal rate, regular rhythm, normal heart sounds and intact distal pulses.   No murmur heard. Pulmonary/Chest: Effort normal and breath sounds normal.  Abdominal: Soft. He exhibits no distension. There is no tenderness. There is no rebound and no guarding.  Genitourinary: Rectal exam shows no internal hemorrhoid, no mass and anal tone normal. Guaiac negative stool.       Rectal tone is nml, no rectal masses appreciated, heme negative stool   Musculoskeletal: He exhibits tenderness. He exhibits no edema.       Lumbar back: He exhibits tenderness and pain. He exhibits normal range of motion, no swelling, no deformity, no laceration and normal pulse.       Back:       Diffuse ttp of the lumbar spine and paraspinal muscles.    Neurological: He is alert and oriented to person, place, and time. No cranial nerve deficit or sensory deficit. He exhibits normal muscle tone. Coordination and gait normal.  Reflex Scores:      Patellar reflexes are 2+ on the right side and 2+ on the left side.      Achilles reflexes are 2+ on the right side and 2+ on the left side. Skin: Skin is warm and dry.    ED Course  Procedures (including critical care time)  Labs Reviewed - No data to display  Patient had l spine film from 05/07/12 showing degenerative disc  disease  is seen at L2-3. No acute abnormality seen.      MDM    Previous Ed charts, labs and imaging were reviewed by me.  This is patient's 4th ED visit for low back pain.  He reports 1-2 episodes of bowel incontinence but it is intermittent.  Rectal tone is nml.  He has a steady gait, no focal neuro deficits on exam.  Doubt emergent neurological or infectious process.     I have explained in detail to the patient that he needs to f/u with a primary care physician regarding his chronic low back pain.  That ER cannot manage his chronic pain.    I have also explained that to him that he may ultimately be referred to pain management.  Patient verbalized understanding  and agrees to care plan.   Prescribed: Flexeril norco #12   Bobby Bridges L. Lucky, [place] 05/13/12 [phone]

## 2012-05-13 NOTE — ED Notes (Signed)
Pt reports low back pain for months, has been unable to follow up with pmd, was seen in er yesterday, vomited on the way home last night.  Having some diarrhea. NAD at arrival.

## 2012-05-13 NOTE — ED Notes (Signed)
Patient c/o constant mid back pain since June. Per patient "missed back up" pulling pallets at work. Patient reports pain progressively getting worse and was seen here recently and received steroid injection. Patient reports taking percocet for pain with relief but is not out of the medication.

## 2012-05-13 NOTE — ED Provider Notes (Signed)
Medical screening examination/treatment/procedure(s) were performed by non-physician practitioner and as supervising physician I was immediately available for consultation/collaboration.   Carleene Cooper III, MD 05/13/12 2012

## 2012-05-24 ENCOUNTER — Encounter (HOSPITAL_COMMUNITY): Payer: Self-pay | Admitting: Emergency Medicine

## 2012-05-24 ENCOUNTER — Emergency Department (HOSPITAL_COMMUNITY)
Admission: EM | Admit: 2012-05-24 | Discharge: 2012-05-24 | Disposition: A | Discharge status: Home / Self Care | Payer: Self-pay | Attending: Emergency Medicine | Admitting: Emergency Medicine

## 2012-05-24 DIAGNOSIS — Z8669 Personal history of other diseases of the nervous system and sense organs: Secondary | ICD-10-CM | POA: Insufficient documentation

## 2012-05-24 DIAGNOSIS — I10 Essential (primary) hypertension: Secondary | ICD-10-CM | POA: Insufficient documentation

## 2012-05-24 DIAGNOSIS — J45909 Unspecified asthma, uncomplicated: Secondary | ICD-10-CM | POA: Insufficient documentation

## 2012-05-24 DIAGNOSIS — K5900 Constipation, unspecified: Secondary | ICD-10-CM | POA: Insufficient documentation

## 2012-05-24 DIAGNOSIS — R110 Nausea: Secondary | ICD-10-CM | POA: Insufficient documentation

## 2012-05-24 DIAGNOSIS — M545 Low back pain, unspecified: Secondary | ICD-10-CM | POA: Insufficient documentation

## 2012-05-24 DIAGNOSIS — Z8659 Personal history of other mental and behavioral disorders: Secondary | ICD-10-CM | POA: Insufficient documentation

## 2012-05-24 DIAGNOSIS — G8929 Other chronic pain: Secondary | ICD-10-CM | POA: Insufficient documentation

## 2012-05-24 DIAGNOSIS — I2510 Atherosclerotic heart disease of native coronary artery without angina pectoris: Secondary | ICD-10-CM | POA: Insufficient documentation

## 2012-05-24 DIAGNOSIS — Z87891 Personal history of nicotine dependence: Secondary | ICD-10-CM | POA: Insufficient documentation

## 2012-05-24 DIAGNOSIS — I252 Old myocardial infarction: Secondary | ICD-10-CM | POA: Insufficient documentation

## 2012-05-24 LAB — URINALYSIS, ROUTINE W REFLEX MICROSCOPIC
Glucose, UA: NEGATIVE mg/dL
Hgb urine dipstick: NEGATIVE
Ketones, ur: NEGATIVE mg/dL
Leukocytes, UA: NEGATIVE
Protein, ur: NEGATIVE mg/dL

## 2012-05-24 MED ORDER — DOCUSATE SODIUM 100 MG PO CAPS: 100.0000 mg | ORAL_CAPSULE | Freq: Two times a day (BID) | ORAL | Status: DC

## 2012-05-24 MED ORDER — MAGNESIUM CITRATE PO SOLN
1.0000 | Freq: Once | ORAL | Status: AC
  Administered 2012-05-24: 1 via ORAL
  Filled 2012-05-24: qty 296

## 2012-05-24 MED ORDER — SENNA 8.6 MG PO TABS: 1.0000 | ORAL_TABLET | Freq: Every day | ORAL | Status: DC

## 2012-05-24 NOTE — ED Provider Notes (Signed)
History     CSN: [id number]  Arrival date & time 05/24/12  1501   First MD Initiated Contact with Patient 05/24/12 1820      Chief Complaint  Patient presents with  . Constipation    (Consider location/radiation/quality/duration/timing/severity/associated sxs/prior treatment) HPI Comments: 37 year old male with a history of chronic low back pain, high blood pressure. He states that he has been having increased difficulty using the bathroom and thinks that his last bowel movement was 7-10 days ago. He does have a history of requiring chronic pain medication including hydrocodone which he was recently took for his low back pain. He has a feeling of needing to have a bowel movement but feels like it will not come out of his rectum. He denies pain in his rectum, he denies abdominal pain and denies dysuria though he has noted dark colored urine. The symptoms are persistent, gradually worsening, nothing seems to make it better or worse. He did try taking an over-the-counter stool softener one time but has not had any other laxatives  Patient is a 37 y.o. male presenting with constipation. The history is provided by the patient.  Constipation  Pertinent negatives include no nausea, no vomiting and no rash.    Past Medical History  Diagnosis Date  . Coronary artery disease   . Myocardial infarction   . Hypertension   . Anxiety   . Depression   . Asthma   . Seizures     outgrew by age 79    History reviewed. No pertinent past surgical history.  History reviewed. No pertinent family history.  History  Substance Use Topics  . Smoking status: Former Games developer  . Smokeless tobacco: Not on file  . Alcohol Use: No      Review of Systems  Gastrointestinal: Positive for constipation. Negative for nausea and vomiting.  Musculoskeletal: Positive for back pain.  Skin: Negative for rash.    Allergies  Review of patient's allergies indicates no known allergies.  Home Medications    Current Outpatient Rx  Name  Route  Sig  Dispense  Refill  . docusate sodium (COLACE) 100 MG capsule   Oral   Take 1 capsule (100 mg total) by mouth every 12 (twelve) hours.   30 capsule   0   . senna (SENOKOT) 8.6 MG TABS   Oral   Take 1 tablet (8.6 mg total) by mouth daily. Until having soft daily stools   30 tablet   0     BP 122/64  Pulse 79  Temp(Src) 98.1 F (36.7 C) (Oral)  Resp 18  Ht 5\' 11"  (1.803 m)  Wt 300 lb (136.079 kg)  BMI 41.86 kg/m2  SpO2 100%  Physical Exam  Nursing note and vitals reviewed. Constitutional: He appears well-developed and well-nourished. No distress.  HENT:  Head: Normocephalic and atraumatic.  Mouth/Throat: Oropharynx is clear and moist. No oropharyngeal exudate.  Eyes: Conjunctivae and EOM are normal. Pupils are equal, round, and reactive to light. Right eye exhibits no discharge. Left eye exhibits no discharge. No scleral icterus.  Neck: Normal range of motion. Neck supple. No JVD present. No thyromegaly present.  Cardiovascular: Normal rate, regular rhythm, normal heart sounds and intact distal pulses.  Exam reveals no gallop and no friction rub.   No murmur heard. Pulmonary/Chest: Effort normal and breath sounds normal. No respiratory distress. He has no wheezes. He has no rales.  Abdominal: Soft. Bowel sounds are normal. He exhibits no distension and no mass. There is no  tenderness.  No tenderness on the abdominal exam. The morbidly obese, soft abdomen, no masses, no guarding, no peritoneal signs  Musculoskeletal: Normal range of motion. He exhibits no edema and no tenderness.  Lymphadenopathy:    He has no cervical adenopathy.  Neurological: He is alert. Coordination normal.  Skin: Skin is warm and dry. No rash noted. No erythema.  Psychiatric: He has a normal mood and affect. His behavior is normal.    ED Course  Procedures (including critical care time)  Labs Reviewed  URINALYSIS, ROUTINE W REFLEX MICROSCOPIC - Abnormal;  Notable for the following:    Specific Gravity, Urine >1.030 (*)    All other components within normal limits   No results found.   1. Constipation       MDM  The patient defers rectal exam, his vital signs are normal, he gives a clinical history consistent with constipation especially with his use of opiate medications. Will give magnesium citrate, home with laxatives and stool softeners. His urine sample does appear dark and has some particulate matter. We'll obtain a urinalysis to rule out infection.  He has followup with his family doctor this week.  Urinalysis shows mild dehydration but no signs of infection, patient has been given magnesium citrate, will be discharged on Colace and senna, followup family doctor, benign appearance, nontender abdomen.  Vida Roller, MD 05/24/12 [phone]

## 2012-05-24 NOTE — ED Notes (Signed)
Pt c/o constipation-unable to state lnbm. Denies difficulty urinating. Pt also c/o back pain. Some nausea. Denies vomiting.

## 2012-05-30 ENCOUNTER — Emergency Department (HOSPITAL_COMMUNITY): Payer: Self-pay

## 2012-05-30 ENCOUNTER — Emergency Department (HOSPITAL_COMMUNITY)
Admission: EM | Admit: 2012-05-30 | Discharge: 2012-05-30 | Disposition: A | Discharge status: Home / Self Care | Payer: Self-pay | Attending: Emergency Medicine | Admitting: Emergency Medicine

## 2012-05-30 ENCOUNTER — Encounter (HOSPITAL_COMMUNITY): Payer: Self-pay

## 2012-05-30 DIAGNOSIS — Y929 Unspecified place or not applicable: Secondary | ICD-10-CM | POA: Insufficient documentation

## 2012-05-30 DIAGNOSIS — S335XXA Sprain of ligaments of lumbar spine, initial encounter: Secondary | ICD-10-CM | POA: Insufficient documentation

## 2012-05-30 DIAGNOSIS — Z87891 Personal history of nicotine dependence: Secondary | ICD-10-CM | POA: Insufficient documentation

## 2012-05-30 DIAGNOSIS — I10 Essential (primary) hypertension: Secondary | ICD-10-CM | POA: Insufficient documentation

## 2012-05-30 DIAGNOSIS — S39012A Strain of muscle, fascia and tendon of lower back, initial encounter: Secondary | ICD-10-CM

## 2012-05-30 DIAGNOSIS — M4120 Other idiopathic scoliosis, site unspecified: Secondary | ICD-10-CM | POA: Insufficient documentation

## 2012-05-30 DIAGNOSIS — I2510 Atherosclerotic heart disease of native coronary artery without angina pectoris: Secondary | ICD-10-CM | POA: Insufficient documentation

## 2012-05-30 DIAGNOSIS — I252 Old myocardial infarction: Secondary | ICD-10-CM | POA: Insufficient documentation

## 2012-05-30 DIAGNOSIS — J45909 Unspecified asthma, uncomplicated: Secondary | ICD-10-CM | POA: Insufficient documentation

## 2012-05-30 DIAGNOSIS — Z8669 Personal history of other diseases of the nervous system and sense organs: Secondary | ICD-10-CM | POA: Insufficient documentation

## 2012-05-30 DIAGNOSIS — M549 Dorsalgia, unspecified: Secondary | ICD-10-CM

## 2012-05-30 DIAGNOSIS — Y9389 Activity, other specified: Secondary | ICD-10-CM | POA: Insufficient documentation

## 2012-05-30 DIAGNOSIS — M419 Scoliosis, unspecified: Secondary | ICD-10-CM

## 2012-05-30 DIAGNOSIS — W010XXA Fall on same level from slipping, tripping and stumbling without subsequent striking against object, initial encounter: Secondary | ICD-10-CM | POA: Insufficient documentation

## 2012-05-30 DIAGNOSIS — Z8659 Personal history of other mental and behavioral disorders: Secondary | ICD-10-CM | POA: Insufficient documentation

## 2012-05-30 MED ORDER — OXYCODONE-ACETAMINOPHEN 5-325 MG PO TABS: 1.0000 | ORAL_TABLET | Freq: Once | ORAL | Status: DC

## 2012-05-30 MED ORDER — CYCLOBENZAPRINE HCL 5 MG PO TABS: 5.0000 mg | ORAL_TABLET | Freq: Three times a day (TID) | ORAL | Status: DC | PRN

## 2012-05-30 MED ORDER — OXYCODONE-ACETAMINOPHEN 5-325 MG PO TABS
1.0000 | ORAL_TABLET | Freq: Once | ORAL | Status: AC
  Administered 2012-05-30: 1 via ORAL
  Filled 2012-05-30: qty 1

## 2012-05-30 NOTE — ED Notes (Signed)
Slipped and fell today, complaining of lower back pain.

## 2012-05-31 NOTE — ED Provider Notes (Signed)
History     CSN: [id number]  Arrival date & time 05/30/12  7829   First MD Initiated Contact with Patient 05/30/12 1913      Chief Complaint  Patient presents with  . Back Pain    (Consider location/radiation/quality/duration/timing/severity/associated sxs/prior treatment) HPI Comments: Bobby Bridges is a 37 y.o. Male presenting with acute on chronic low back pain since slipping today and falling directly on his back onto a grassy area.  There is no radiation into his  lower extremities.  There has been no weakness or numbness in the lower extremities and no urinary or bowel retention or incontinence.  Patient does not have a history of cancer or IVDU.  He does have a history of chronic low back pain.      The history is provided by the patient.    Past Medical History  Diagnosis Date  . Coronary artery disease   . Myocardial infarction   . Hypertension   . Anxiety   . Depression   . Asthma   . Seizures     outgrew by age 56    History reviewed. No pertinent past surgical history.  History reviewed. No pertinent family history.  History  Substance Use Topics  . Smoking status: Former Games developer  . Smokeless tobacco: Not on file  . Alcohol Use: No      Review of Systems  Constitutional: Negative for fever.  Respiratory: Negative for shortness of breath.   Cardiovascular: Negative for chest pain and leg swelling.  Gastrointestinal: Negative for abdominal pain, constipation and abdominal distention.  Genitourinary: Negative for dysuria, urgency, frequency, flank pain and difficulty urinating.  Musculoskeletal: Positive for back pain. Negative for joint swelling and gait problem.  Skin: Negative for rash.  Neurological: Negative for weakness and numbness.    Allergies  Review of patient's allergies indicates no known allergies.  Home Medications   Current Outpatient Rx  Name  Route  Sig  Dispense  Refill  . cyclobenzaprine (FLEXERIL) 5 MG tablet  Oral   Take 1 tablet (5 mg total) by mouth 3 (three) times daily as needed for muscle spasms.   15 tablet   0   . docusate sodium (COLACE) 100 MG capsule   Oral   Take 1 capsule (100 mg total) by mouth every 12 (twelve) hours.   30 capsule   0   . oxyCODONE-acetaminophen (PERCOCET/ROXICET) 5-325 MG per tablet   Oral   Take 1 tablet by mouth once.   20 tablet   0   . senna (SENOKOT) 8.6 MG TABS   Oral   Take 1 tablet (8.6 mg total) by mouth daily. Until having soft daily stools   30 tablet   0     BP 115/59  Pulse 84  Temp(Src) 98.5 F (36.9 C) (Oral)  Ht 5\' 11"  (1.803 m)  Wt 311 lb (141.069 kg)  BMI 43.4 kg/m2  SpO2 95%  Physical Exam  Nursing note and vitals reviewed. Constitutional: He appears well-developed and well-nourished.  HENT:  Head: Normocephalic.  Eyes: Conjunctivae are normal.  Neck: Normal range of motion. Neck supple.  Cardiovascular: Normal rate and intact distal pulses.   Pedal pulses normal.  Pulmonary/Chest: Effort normal.  Abdominal: Soft. Bowel sounds are normal. He exhibits no distension and no mass.  Musculoskeletal: Normal range of motion. He exhibits no edema.       Lumbar back: He exhibits bony tenderness. He exhibits no swelling, no edema and no spasm.  Midline and  paralumbar lower ttp. No SI joint tenderness.  Neurological: He is alert. He has normal strength. He displays no atrophy and no tremor. No sensory deficit. Gait normal.  Reflex Scores:      Patellar reflexes are 2+ on the right side and 2+ on the left side.      Achilles reflexes are 2+ on the right side and 2+ on the left side. No strength deficit noted in hip and knee flexor and extensor muscle groups.  Ankle flexion and extension intact.  Skin: Skin is warm and dry.  Psychiatric: He has a normal mood and affect.    ED Course  Procedures (including critical care time)  Labs Reviewed - No data to display Dg Lumbar Spine Complete  05/30/2012  *RADIOLOGY REPORT*   Clinical Data: Low back pain following a fall today.  LUMBAR SPINE - COMPLETE 4+ VIEW  Comparison: 05/07/2012.  Findings: Five non-rib bearing lumbar vertebrae.  Stable levoconvex rotary scoliosis and lateral spur formation at the L2-3 level.  No fractures, subluxations or pars defects.  IMPRESSION: No acute abnormality.  Stable scoliosis and degenerative changes.   Original Report Authenticated By: Beckie Salts, M.D.      1. Lumbar strain   2. Scoliosis   3. Chronic back pain       MDM  No neuro deficit on exam or by history to suggest emergent or surgical presentation.  Also discussed worsened sx that should prompt immediate re-evaluation including distal weakness, bowel/bladder retention/incontinence.  Pt was prescribed oxycodone, flexeril,  Encouraged to avoid lifting, bending, any activities that worsens his pain. Ice therapy for the next 2 days,  Adding heat on day 3.  Referrals given prn no improvement in sx.        Burgess Amor, PA 06/01/12 0001

## 2012-06-01 NOTE — ED Provider Notes (Signed)
Medical screening examination/treatment/procedure(s) were performed by non-physician practitioner and as supervising physician I was immediately available for consultation/collaboration.   Benny Lennert, MD 06/01/12 [phone]

## 2012-06-06 ENCOUNTER — Emergency Department (HOSPITAL_COMMUNITY)
Admission: EM | Admit: 2012-06-06 | Discharge: 2012-06-07 | Disposition: A | Discharge status: Home / Self Care | Payer: Self-pay | Attending: Emergency Medicine | Admitting: Emergency Medicine

## 2012-06-06 ENCOUNTER — Encounter (HOSPITAL_COMMUNITY): Payer: Self-pay | Admitting: *Deleted

## 2012-06-06 DIAGNOSIS — M545 Low back pain, unspecified: Secondary | ICD-10-CM | POA: Insufficient documentation

## 2012-06-06 DIAGNOSIS — Z8669 Personal history of other diseases of the nervous system and sense organs: Secondary | ICD-10-CM | POA: Insufficient documentation

## 2012-06-06 DIAGNOSIS — I10 Essential (primary) hypertension: Secondary | ICD-10-CM | POA: Insufficient documentation

## 2012-06-06 DIAGNOSIS — I252 Old myocardial infarction: Secondary | ICD-10-CM | POA: Insufficient documentation

## 2012-06-06 DIAGNOSIS — Z8659 Personal history of other mental and behavioral disorders: Secondary | ICD-10-CM | POA: Insufficient documentation

## 2012-06-06 DIAGNOSIS — Z87891 Personal history of nicotine dependence: Secondary | ICD-10-CM | POA: Insufficient documentation

## 2012-06-06 DIAGNOSIS — I2510 Atherosclerotic heart disease of native coronary artery without angina pectoris: Secondary | ICD-10-CM | POA: Insufficient documentation

## 2012-06-06 DIAGNOSIS — J45909 Unspecified asthma, uncomplicated: Secondary | ICD-10-CM | POA: Insufficient documentation

## 2012-06-06 MED ORDER — IBUPROFEN 800 MG PO TABS: 800.0000 mg | ORAL_TABLET | Freq: Three times a day (TID) | ORAL | Status: DC

## 2012-06-06 MED ORDER — IBUPROFEN 800 MG PO TABS
800.0000 mg | ORAL_TABLET | Freq: Once | ORAL | Status: AC
  Administered 2012-06-06: 800 mg via ORAL
  Filled 2012-06-06: qty 1

## 2012-06-06 MED ORDER — TRAMADOL HCL 50 MG PO TABS: 50.0000 mg | ORAL_TABLET | Freq: Four times a day (QID) | ORAL | Status: DC | PRN

## 2012-06-06 MED ORDER — HYDROCODONE-ACETAMINOPHEN 5-325 MG PO TABS
2.0000 | ORAL_TABLET | Freq: Once | ORAL | Status: AC
  Administered 2012-06-06: 2 via ORAL
  Filled 2012-06-06: qty 2

## 2012-06-06 NOTE — ED Provider Notes (Signed)
History     CSN: [id number]  Arrival date & time 06/06/12  2214   First MD Initiated Contact with Patient 06/06/12 2250      Chief Complaint  Patient presents with  . Back Pain    (Consider location/radiation/quality/duration/timing/severity/associated sxs/prior treatment) HPI Comments: Patient c/o low back pain for several months.  States the pain is similar to previous. Pain radiates into the left upper leg to the level of his knee.  He was seen here last week for same.  He denies recent injury, incontinence of bladder or bowel, saddle anesthesia's, dysuria or numbness or weakness to the extremities.    Patient is a 37 y.o. male presenting with back pain. The history is provided by the patient.  Back Pain Location:  Lumbar spine Quality:  Aching and shooting Radiates to:  L posterior upper leg and L knee Pain severity:  Moderate Pain is:  Same all the time Timing:  Constant Progression:  Unchanged Context: not falling, not jumping from heights, not lifting heavy objects, not recent injury and not twisting   Relieved by:  Bed rest and being still Worsened by:  Ambulation, bending, movement and standing Ineffective treatments:  None tried Associated symptoms: leg pain   Associated symptoms: no abdominal pain, no abdominal swelling, no bladder incontinence, no bowel incontinence, no chest pain, no dysuria, no fever, no headaches, no numbness, no paresthesias, no pelvic pain, no perianal numbness, no tingling and no weakness     Past Medical History  Diagnosis Date  . Coronary artery disease   . Myocardial infarction   . Hypertension   . Anxiety   . Depression   . Asthma   . Seizures     outgrew by age 46    History reviewed. No pertinent past surgical history.  History reviewed. No pertinent family history.  History  Substance Use Topics  . Smoking status: Former Games developer  . Smokeless tobacco: Not on file  . Alcohol Use: No      Review of Systems    Constitutional: Negative for fever, activity change and appetite change.  HENT: Negative for neck pain and neck stiffness.   Respiratory: Negative for shortness of breath.   Cardiovascular: Negative for chest pain.  Gastrointestinal: Negative for vomiting, abdominal pain, constipation and bowel incontinence.  Genitourinary: Negative for bladder incontinence, dysuria, hematuria, flank pain, decreased urine volume, scrotal swelling, difficulty urinating, testicular pain and pelvic pain.       No perineal numbness or incontinence of urine or feces  Musculoskeletal: Positive for back pain. Negative for joint swelling and gait problem.  Skin: Negative for rash.  Neurological: Negative for tingling, weakness, numbness, headaches and paresthesias.  All other systems reviewed and are negative.    Allergies  Review of patient's allergies indicates no known allergies.  Home Medications   Current Outpatient Rx  Name  Route  Sig  Dispense  Refill  . cyclobenzaprine (FLEXERIL) 5 MG tablet   Oral   Take 1 tablet (5 mg total) by mouth 3 (three) times daily as needed for muscle spasms.   15 tablet   0   . docusate sodium (COLACE) 100 MG capsule   Oral   Take 1 capsule (100 mg total) by mouth every 12 (twelve) hours.   30 capsule   0   . oxyCODONE-acetaminophen (PERCOCET/ROXICET) 5-325 MG per tablet   Oral   Take 1 tablet by mouth once.   20 tablet   0   . senna (SENOKOT) 8.6  MG TABS   Oral   Take 1 tablet (8.6 mg total) by mouth daily. Until having soft daily stools   30 tablet   0     BP 135/92  Pulse 89  Temp(Src) 98 F (36.7 C) (Oral)  Resp 14  Ht 5\' 11"  (1.803 m)  Wt 312 lb 6.4 oz (141.704 kg)  BMI 43.59 kg/m2  SpO2 100%  Physical Exam  Nursing note and vitals reviewed. Constitutional: He is oriented to person, place, and time. He appears well-developed and well-nourished. No distress.  HENT:  Head: Normocephalic and atraumatic.  Neck: Normal range of motion. Neck  supple.  Cardiovascular: Normal rate, regular rhythm, normal heart sounds and intact distal pulses.   No murmur heard. Pulmonary/Chest: Effort normal and breath sounds normal. No respiratory distress.  Musculoskeletal: He exhibits tenderness. He exhibits no edema.       Lumbar back: He exhibits tenderness and pain. He exhibits normal range of motion, no swelling, no deformity, no laceration and normal pulse.       Back:  Diffuse ttp of the lumbar spine, paraspinal muscles and left SI joint space.  DP pulse are brisk and symmetrical, distal sensation intact  Neurological: He is alert and oriented to person, place, and time. No cranial nerve deficit or sensory deficit. He exhibits normal muscle tone. Coordination and gait normal.  Reflex Scores:      Patellar reflexes are 2+ on the right side and 2+ on the left side.      Achilles reflexes are 2+ on the right side and 2+ on the left side. Skin: Skin is warm and dry.    ED Course  Procedures (including critical care time)  Labs Reviewed - No data to display No results found.      MDM   Previous ED chart reviewed by me,  patient has been seen here multiple times for same. Seen here last Friday and treated for same.  Took last percocet's today.  States he has been "doubling up" on his pain medication and ran out today.  Has ttp of the left lumbar paraspinal muscles and SI joint.  Had imaging on that visit that showed scoliosis and lateral spur formation at the L2-3 level.  Doubt emergent neurological or infectious process.  Patient ambulated in the ED w/o difficulty.     I have advised patient that he will need further pain management with his PMD or through a pain management physician.  Prescribed: Ultram #20 ibuprofen            Maymuna Detzel L. Tiziana Cislo, [place] 06/07/12 1610

## 2012-06-06 NOTE — ED Notes (Signed)
Low back pain today.  Seen here recently for same

## 2012-06-08 NOTE — ED Provider Notes (Signed)
Medical screening examination/treatment/procedure(s) were performed by non-physician practitioner and as supervising physician I was immediately available for consultation/collaboration.   Shelda Jakes, MD 06/08/12 1037

## 2012-08-02 ENCOUNTER — Encounter (HOSPITAL_COMMUNITY): Payer: Self-pay | Admitting: *Deleted

## 2012-08-02 ENCOUNTER — Emergency Department (HOSPITAL_COMMUNITY)
Admission: EM | Admit: 2012-08-02 | Discharge: 2012-08-02 | Disposition: A | Discharge status: Home / Self Care | Payer: Self-pay | Attending: Emergency Medicine | Admitting: Emergency Medicine

## 2012-08-02 DIAGNOSIS — R21 Rash and other nonspecific skin eruption: Secondary | ICD-10-CM | POA: Insufficient documentation

## 2012-08-02 DIAGNOSIS — M545 Low back pain, unspecified: Secondary | ICD-10-CM | POA: Insufficient documentation

## 2012-08-02 DIAGNOSIS — I2510 Atherosclerotic heart disease of native coronary artery without angina pectoris: Secondary | ICD-10-CM | POA: Insufficient documentation

## 2012-08-02 DIAGNOSIS — G8929 Other chronic pain: Secondary | ICD-10-CM | POA: Insufficient documentation

## 2012-08-02 DIAGNOSIS — J45909 Unspecified asthma, uncomplicated: Secondary | ICD-10-CM | POA: Insufficient documentation

## 2012-08-02 DIAGNOSIS — Z8669 Personal history of other diseases of the nervous system and sense organs: Secondary | ICD-10-CM | POA: Insufficient documentation

## 2012-08-02 DIAGNOSIS — I252 Old myocardial infarction: Secondary | ICD-10-CM | POA: Insufficient documentation

## 2012-08-02 DIAGNOSIS — Z87891 Personal history of nicotine dependence: Secondary | ICD-10-CM | POA: Insufficient documentation

## 2012-08-02 DIAGNOSIS — I10 Essential (primary) hypertension: Secondary | ICD-10-CM | POA: Insufficient documentation

## 2012-08-02 DIAGNOSIS — L293 Anogenital pruritus, unspecified: Secondary | ICD-10-CM | POA: Insufficient documentation

## 2012-08-02 DIAGNOSIS — B356 Tinea cruris: Secondary | ICD-10-CM | POA: Insufficient documentation

## 2012-08-02 DIAGNOSIS — Z8659 Personal history of other mental and behavioral disorders: Secondary | ICD-10-CM | POA: Insufficient documentation

## 2012-08-02 MED ORDER — IBUPROFEN 600 MG PO TABS: 600.0000 mg | ORAL_TABLET | Freq: Four times a day (QID) | ORAL | Status: DC | PRN

## 2012-08-02 MED ORDER — HYDROCODONE-ACETAMINOPHEN 5-325 MG PO TABS
1.0000 | ORAL_TABLET | Freq: Once | ORAL | Status: AC
  Administered 2012-08-02: 1 via ORAL
  Filled 2012-08-02: qty 1

## 2012-08-02 MED ORDER — CLOTRIMAZOLE 1 % EX CREA: TOPICAL_CREAM | CUTANEOUS | Status: DC

## 2012-08-02 MED ORDER — CYCLOBENZAPRINE HCL 10 MG PO TABS: 10.0000 mg | ORAL_TABLET | Freq: Once | ORAL | Status: DC

## 2012-08-02 MED ORDER — HYDROCODONE-ACETAMINOPHEN 5-325 MG PO TABS: 1.0000 | ORAL_TABLET | ORAL | Status: DC | PRN

## 2012-08-02 MED ORDER — IBUPROFEN 800 MG PO TABS
800.0000 mg | ORAL_TABLET | Freq: Once | ORAL | Status: AC
  Administered 2012-08-02: 800 mg via ORAL
  Filled 2012-08-02: qty 1

## 2012-08-02 NOTE — ED Notes (Signed)
Pt reporting pain down length of back.  Pt reporting history of chronic back pain and states that pain has gotten worse in past 3 weeks.

## 2012-08-02 NOTE — ED Notes (Signed)
Pt c/o chronic back pain. States "I ran out of my pain medicine". Also reports "bumps to my area".

## 2012-08-02 NOTE — ED Provider Notes (Signed)
History     CSN: [id number]  Arrival date & time 08/02/12  2135   First MD Initiated Contact with Patient 08/02/12 2148      No chief complaint on file.   (Consider location/radiation/quality/duration/timing/severity/associated sxs/prior treatment) HPI Comments: SIXTO BOWDISH is a 37 y.o. Male who presents with acute on chronic low back pain.  He presents with acute on chronic low back pain which has which has been worsening over the past 3 days.   Patient denies any new injury specifically, but he does have a history of scoliosis and chronic pain.  He reports hurting down the entire length of his back and states he feels and hears "popping" all the time with movement.  There is radiation into his right posterior thigh.  There has been no weakness or numbness in the lower extremities and no urinary or bowel retention or incontinence.  Patient does not have a history of cancer or IVDU.  He also mentions itching and rash of his penis over the past week and has noticed bumps on his penis.  He denies dysuria and penile discharge.          The history is provided by the patient.    Past Medical History  Diagnosis Date  . Coronary artery disease   . Myocardial infarction   . Hypertension   . Anxiety   . Depression   . Asthma   . Seizures     outgrew by age 58    History reviewed. No pertinent past surgical history.  History reviewed. No pertinent family history.  History  Substance Use Topics  . Smoking status: Former Games developer  . Smokeless tobacco: Not on file  . Alcohol Use: No      Review of Systems  Constitutional: Negative for fever.  Respiratory: Negative for shortness of breath.   Cardiovascular: Negative for chest pain and leg swelling.  Gastrointestinal: Negative for abdominal pain, constipation and abdominal distention.  Genitourinary: Negative for dysuria, urgency, frequency, flank pain and difficulty urinating.  Musculoskeletal: Positive for back  pain. Negative for joint swelling and gait problem.  Skin: Negative for rash.  Neurological: Negative for weakness and numbness.    Allergies  Review of patient's allergies indicates no known allergies.  Home Medications   Current Outpatient Rx  Name  Route  Sig  Dispense  Refill  . clotrimazole (LOTRIMIN) 1 % cream      Apply to affected area 2 times daily   15 g   0   . HYDROcodone-acetaminophen (NORCO/VICODIN) 5-325 MG per tablet   Oral   Take 1 tablet by mouth every 4 (four) hours as needed for pain.   15 tablet   0   . ibuprofen (ADVIL,MOTRIN) 600 MG tablet   Oral   Take 1 tablet (600 mg total) by mouth every 6 (six) hours as needed for pain.   20 tablet   0     BP 118/67  Pulse 80  Temp(Src) 97.7 F (36.5 C) (Oral)  Resp 18  Ht 5\' 11"  (1.803 m)  Wt 303 lb 14.4 oz (137.848 kg)  BMI 42.4 kg/m2  SpO2 98%  Physical Exam  Nursing note and vitals reviewed. Constitutional: He appears well-developed and well-nourished.  HENT:  Head: Normocephalic.  Eyes: Conjunctivae are normal.  Neck: Normal range of motion. Neck supple.  Cardiovascular: Normal rate and intact distal pulses.   Pedal pulses normal.  Pulmonary/Chest: Effort normal.  Abdominal: Soft. Bowel sounds are normal. He exhibits no  distension and no mass.  Genitourinary:  Slight scaling at base of penis,  Excoriations present.  He has several small nodules at base of penis that appear to be small sebaceous cysts.  Musculoskeletal: Normal range of motion. He exhibits no edema.       Lumbar back: He exhibits tenderness. He exhibits no swelling, no edema and no spasm.  Neurological: He is alert. He has normal strength. He displays no atrophy and no tremor. No sensory deficit. Gait normal.  Reflex Scores:      Patellar reflexes are 2+ on the right side and 2+ on the left side.      Achilles reflexes are 2+ on the right side and 2+ on the left side. No strength deficit noted in hip and knee flexor and  extensor muscle groups.  Ankle flexion and extension intact.  Skin: Skin is warm and dry.  Psychiatric: He has a normal mood and affect.   Chaperone was present during exam.   ED Course  Procedures (including critical care time)  Labs Reviewed - No data to display No results found.   1. Chronic back pain   2. Tinea cruris       MDM  No neuro deficit on exam or by history to suggest emergent or surgical presentation.  Also discussed worsened sx that should prompt immediate re-evaluation including distal weakness, bowel/bladder retention/incontinence.  Patient was prescribed hydrocodone and ibuprofen for his back pain.  It was advised that he seek a primary medical care and referrals were given for that.  He is also prescribed Lotrimin 1% cream to apply to his groin twice a day.  Advised to keep area as dry as possible.  Recheck for any worsened symptoms.             Burgess Amor, PA-C 08/03/12 0126

## 2012-08-03 NOTE — ED Provider Notes (Signed)
History/physical exam/procedure(s) were performed by non-physician practitioner and as supervising physician I was immediately available for consultation/collaboration. I have reviewed all notes and am in agreement with care and plan.   Gerren Hoffmeier S Edan Juday, MD 08/03/12 1614 

## 2012-08-22 ENCOUNTER — Emergency Department (HOSPITAL_COMMUNITY)
Admission: EM | Admit: 2012-08-22 | Discharge: 2012-08-22 | Disposition: A | Discharge status: Home / Self Care | Payer: Self-pay | Attending: Emergency Medicine | Admitting: Emergency Medicine

## 2012-08-22 ENCOUNTER — Encounter (HOSPITAL_COMMUNITY): Payer: Self-pay | Admitting: Emergency Medicine

## 2012-08-22 DIAGNOSIS — J45909 Unspecified asthma, uncomplicated: Secondary | ICD-10-CM | POA: Insufficient documentation

## 2012-08-22 DIAGNOSIS — R067 Sneezing: Secondary | ICD-10-CM

## 2012-08-22 DIAGNOSIS — F329 Major depressive disorder, single episode, unspecified: Secondary | ICD-10-CM

## 2012-08-22 DIAGNOSIS — I252 Old myocardial infarction: Secondary | ICD-10-CM | POA: Insufficient documentation

## 2012-08-22 DIAGNOSIS — I10 Essential (primary) hypertension: Secondary | ICD-10-CM | POA: Insufficient documentation

## 2012-08-22 DIAGNOSIS — M2550 Pain in unspecified joint: Secondary | ICD-10-CM | POA: Insufficient documentation

## 2012-08-22 DIAGNOSIS — M549 Dorsalgia, unspecified: Secondary | ICD-10-CM | POA: Insufficient documentation

## 2012-08-22 DIAGNOSIS — F411 Generalized anxiety disorder: Secondary | ICD-10-CM | POA: Insufficient documentation

## 2012-08-22 DIAGNOSIS — I2510 Atherosclerotic heart disease of native coronary artery without angina pectoris: Secondary | ICD-10-CM | POA: Insufficient documentation

## 2012-08-22 DIAGNOSIS — R6889 Other general symptoms and signs: Secondary | ICD-10-CM | POA: Insufficient documentation

## 2012-08-22 DIAGNOSIS — Z87891 Personal history of nicotine dependence: Secondary | ICD-10-CM | POA: Insufficient documentation

## 2012-08-22 DIAGNOSIS — F3289 Other specified depressive episodes: Secondary | ICD-10-CM | POA: Insufficient documentation

## 2012-08-22 MED ORDER — LORATADINE 10 MG PO TABS: 10.0000 mg | ORAL_TABLET | Freq: Every day | ORAL | Status: DC

## 2012-08-22 MED ORDER — HYDROCODONE-ACETAMINOPHEN 5-325 MG PO TABS
2.0000 | ORAL_TABLET | Freq: Once | ORAL | Status: AC
  Administered 2012-08-22: 1 via ORAL
  Filled 2012-08-22: qty 1

## 2012-08-22 MED ORDER — HYDROCODONE-ACETAMINOPHEN 5-325 MG PO TABS: 1.0000 | ORAL_TABLET | ORAL | Status: DC | PRN

## 2012-08-22 MED ORDER — DIAZEPAM 5 MG PO TABS
5.0000 mg | ORAL_TABLET | Freq: Once | ORAL | Status: AC
  Administered 2012-08-22: 5 mg via ORAL
  Filled 2012-08-22: qty 1

## 2012-08-22 MED ORDER — BACLOFEN 10 MG PO TABS: 10.0000 mg | ORAL_TABLET | Freq: Three times a day (TID) | ORAL | Status: AC

## 2012-08-22 NOTE — ED Provider Notes (Signed)
History     CSN: [id number]  Arrival date & time 08/22/12  0905   First MD Initiated Contact with Patient 08/22/12 [phone]      Chief Complaint  Patient presents with  . Nasal Congestion    (Consider location/radiation/quality/duration/timing/severity/associated sxs/prior treatment) HPI Comments: Patient is a 37 year old male with a history of coronary artery disease anxiety depression seizures and asthma who presents to the emergency department with a chief complaint of nasal congestion, back pain, and crying a lot.  The patient states that for approximately one week he's been having nasal congestion and sneezing. He states that as a result of the sneezing his back pain becomes worse. 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 The patient states he is scheduled to see a specialist in the next 2 weeks. He denies any loss of bowel or bladder function. He has pain with movement and worse with sneezing in his lower back.  The patient states that he is treated for depression and anxiety. Patient states that he is to see the doctors at [facility] [facility] next week. He denies suicidal or homicidal ideations. He denies use of excessive alcohol or drugs.  The patient states he was previously treated with hydrocodone which did seem to help his back some but he is now out of this medication and is requesting assistance with the back pain.  The history is provided by the patient.    Past Medical History  Diagnosis Date  . Coronary artery disease   . Myocardial infarction   . Hypertension   . Anxiety   . Depression   . Asthma   . Seizures     outgrew by age 54    History reviewed. No pertinent past surgical history.  History reviewed. No pertinent family history.  History  Substance Use Topics  . Smoking status: Former Games developer  . Smokeless tobacco: Not on file  . Alcohol Use: No      Review of Systems  Constitutional: Negative for activity change.       All  ROS Neg except as noted in HPI  HENT: Positive for congestion and sneezing. Negative for nosebleeds and neck pain.   Eyes: Negative for photophobia and discharge.  Respiratory: Negative for cough, shortness of breath and wheezing.   Cardiovascular: Negative for chest pain and palpitations.  Gastrointestinal: Negative for abdominal pain and blood in stool.  Genitourinary: Negative for dysuria, frequency and hematuria.  Musculoskeletal: Positive for back pain and arthralgias.  Skin: Negative.   Neurological: Negative for dizziness, seizures and speech difficulty.  Psychiatric/Behavioral: Negative for hallucinations and confusion. The patient is nervous/anxious.        Depression    Allergies  Review of patient's allergies indicates no known allergies.  Home Medications   Current Outpatient Rx  Name  Route  Sig  Dispense  Refill  . HYDROcodone-acetaminophen (NORCO/VICODIN) 5-325 MG per tablet   Oral   Take 1 tablet by mouth every 4 (four) hours as needed for pain.   15 tablet   0     BP 96/74  Pulse 102  Temp(Src) 97.3 F (36.3 C) (Oral)  Resp 20  SpO2 99%  Physical Exam  Nursing note and vitals reviewed. Constitutional: He is oriented to person, place, and time. He appears well-developed and well-nourished.  Non-toxic appearance.  HENT:  Head: Normocephalic.  Right Ear: Tympanic membrane and external ear normal.  Left Ear: Tympanic membrane and external ear normal.  Nasal congestion. Multiple dental caries of the upper and lower jaw.  Eyes: EOM and lids are normal. Pupils are equal, round, and reactive to light.  Neck: Normal range of motion. Neck supple. Carotid bruit is not present.  Cardiovascular: Normal rate, regular rhythm, normal heart sounds, intact distal pulses and normal pulses.   Pulmonary/Chest: Breath sounds normal. No respiratory distress.  Abdominal: Soft. Bowel sounds are normal. There is no tenderness. There is no guarding.  Musculoskeletal:  Lower  thoracic and entire lumbar area pain to palpation in change of position. There is paraspinal tenderness and spasm of the lumbar region. There no hot areas appreciated. There is no palpable step off.  Lymphadenopathy:       Head (right side): No submandibular adenopathy present.       Head (left side): No submandibular adenopathy present.    He has no cervical adenopathy.  Neurological: He is alert and oriented to person, place, and time. He has normal strength. No cranial nerve deficit or sensory deficit. He exhibits normal muscle tone. Coordination normal.  No gross motor or sensory deficit appreciated on limited examination.  Skin: Skin is warm and dry.  Psychiatric: He has a normal mood and affect. His speech is normal.  Patient mood and affect are appropriate for condition at this time. The patient denies any suicidal or homicidal ideation. He denies any suicidal plans.    ED Course  Procedures (including critical care time)  Labs Reviewed - No data to display No results found.   No diagnosis found.    MDM  I have reviewed nursing notes, vital signs, and all appropriate lab and imaging results for this patient. Patient presents to the emergency department with approximately one week of the sneezing. Patient states this aggravates his chronic back problem. He presents to the emergency department for assistance with his sneezing and back pain. The patient also complains of crying a lot, states that he is scheduled to see the doctors at [facility]     The plan at this time is for the patient to be treated with Norco 5 mg one every 4 hours as needed for pain #15 tablets and baclofen 1 tablet 3 times daily for spasm. Patient strongly advised to see the back specialist in the psychiatry specialist that he is scheduled to see in the next weeks. He is to return to the emergency department if any changes, problems, or concerns.  Kathie Dike, PA-C 08/22/12 1005

## 2012-08-22 NOTE — ED Notes (Addendum)
Pt c/o sneezing x 1 week. States when he sneezes his whole body tightens up. C/o chronic lower back pain also. Nad. No congestion noted. C/o soreness to center of chest. Denies radiation. Pt states needs a medication for depression also. States he cries a lot "but Im trying to deal with it". Pt denies si/hi/hallucinations/hearing voices. States he just needs medicine to help him

## 2012-08-25 NOTE — ED Provider Notes (Signed)
Medical screening examination/treatment/procedure(s) were performed by non-physician practitioner and as supervising physician I was immediately available for consultation/collaboration.   [name], MD 08/25/12 2209

## 2012-08-29 ENCOUNTER — Emergency Department (HOSPITAL_COMMUNITY)
Admission: EM | Admit: 2012-08-29 | Discharge: 2012-08-29 | Disposition: A | Discharge status: Home / Self Care | Payer: Medicaid - Out of State | Attending: Emergency Medicine | Admitting: Emergency Medicine

## 2012-08-29 ENCOUNTER — Encounter (HOSPITAL_COMMUNITY): Payer: Self-pay | Admitting: *Deleted

## 2012-08-29 DIAGNOSIS — Z8659 Personal history of other mental and behavioral disorders: Secondary | ICD-10-CM | POA: Insufficient documentation

## 2012-08-29 DIAGNOSIS — I2510 Atherosclerotic heart disease of native coronary artery without angina pectoris: Secondary | ICD-10-CM | POA: Insufficient documentation

## 2012-08-29 DIAGNOSIS — Z8669 Personal history of other diseases of the nervous system and sense organs: Secondary | ICD-10-CM | POA: Insufficient documentation

## 2012-08-29 DIAGNOSIS — R609 Edema, unspecified: Secondary | ICD-10-CM | POA: Insufficient documentation

## 2012-08-29 DIAGNOSIS — J45909 Unspecified asthma, uncomplicated: Secondary | ICD-10-CM | POA: Insufficient documentation

## 2012-08-29 DIAGNOSIS — M545 Low back pain, unspecified: Secondary | ICD-10-CM | POA: Insufficient documentation

## 2012-08-29 DIAGNOSIS — M549 Dorsalgia, unspecified: Secondary | ICD-10-CM

## 2012-08-29 DIAGNOSIS — I252 Old myocardial infarction: Secondary | ICD-10-CM | POA: Insufficient documentation

## 2012-08-29 DIAGNOSIS — I10 Essential (primary) hypertension: Secondary | ICD-10-CM | POA: Insufficient documentation

## 2012-08-29 DIAGNOSIS — Z87891 Personal history of nicotine dependence: Secondary | ICD-10-CM | POA: Insufficient documentation

## 2012-08-29 LAB — CBC WITH DIFFERENTIAL/PLATELET
Basophils Relative: 0 % (ref 0–1)
HCT: 41.3 % (ref 39.0–52.0)
Hemoglobin: 13.8 g/dL (ref 13.0–17.0)
Lymphs Abs: 1.5 10*3/uL (ref 0.7–4.0)
MCH: 28 pg (ref 26.0–34.0)
MCHC: 33.4 g/dL (ref 30.0–36.0)
Monocytes Absolute: 0.2 10*3/uL (ref 0.1–1.0)
Monocytes Relative: 7 % (ref 3–12)
Neutro Abs: 1.5 10*3/uL — ABNORMAL LOW (ref 1.7–7.7)
Neutrophils Relative %: 45 % (ref 43–77)
RBC: 4.93 MIL/uL (ref 4.22–5.81)

## 2012-08-29 LAB — COMPREHENSIVE METABOLIC PANEL
Alkaline Phosphatase: 73 U/L (ref 39–117)
BUN: 16 mg/dL (ref 6–23)
Chloride: 103 mEq/L (ref 96–112)
Creatinine, Ser: 1.08 mg/dL (ref 0.50–1.35)
GFR calc Af Amer: >90 mL/min (ref >90)
GFR calc non Af Amer: 87 mL/min — ABNORMAL LOW (ref >90)
Glucose, Bld: 90 mg/dL (ref 70–99)
Potassium: 3.9 mEq/L (ref 3.5–5.1)
Total Bilirubin: 0.3 mg/dL (ref 0.3–1.2)

## 2012-08-29 LAB — PRO B NATRIURETIC PEPTIDE: Pro B Natriuretic peptide (BNP): 75.6 pg/mL (ref 0–125)

## 2012-08-29 MED ORDER — OXYCODONE-ACETAMINOPHEN 5-325 MG PO TABS: 1.0000 | ORAL_TABLET | Freq: Four times a day (QID) | ORAL | Status: DC | PRN

## 2012-08-29 MED ORDER — HYDROMORPHONE HCL PF 1 MG/ML IJ SOLN
1.0000 mg | Freq: Once | INTRAMUSCULAR | Status: AC
  Administered 2012-08-29: 1 mg via INTRAMUSCULAR

## 2012-08-29 MED ORDER — FUROSEMIDE 20 MG PO TABS: 20.0000 mg | ORAL_TABLET | Freq: Every day | ORAL | Status: DC

## 2012-08-29 NOTE — ED Notes (Signed)
Pt states swelling to lower legs, first noticed last night. Also states that back pain is no better since last visit and needs something stronger.

## 2012-08-29 NOTE — ED Provider Notes (Signed)
History  This chart was scribed for Bobby Lennert, Bobby Bridges by Ardelia Mems, ED Scribe. This patient was seen in room APA05/APA05 and the patient's care was started at 11:10 AM.   CSN: [id number]  Arrival date & time 08/29/12  1034     Chief Complaint  Patient presents with  . Leg Swelling  . Back Pain    Patient is a 37 y.o. male presenting with back pain. The history is provided by the patient. No language interpreter was used.  Back Pain Location:  Lumbar spine Quality:  Aching Radiates to:  Does not radiate Pain severity:  Moderate Pain is:  Same all the time Onset quality:  Gradual Timing:  Intermittent Progression:  Unchanged Chronicity:  Recurrent Relieved by:  Nothing Associated symptoms: no abdominal pain, no chest pain and no headaches     HPI Comments: Bobby Bridges is a 37 y.o. male with a h/o CAD, MI and HTN who presents to the Emergency Department complaining of moderate, bilateral swelling to lower legs, first noticed last night. He also states that she has constant moderate back pain that he was previously seen for. His back pain is no better and he requests something stronger than hydrocodone for the pain. Pt believes that he has a ruptured disc in his lower back along with arthritis. He has had an X-ray to possibly suggest this but no MRI. Pt denies fever, chills, emesis, diarrhea or any other symptoms. Pt denies alcohol use and is a former smoker.   Past Medical History  Diagnosis Date  . Coronary artery disease   . Myocardial infarction   . Hypertension   . Anxiety   . Depression   . Asthma   . Seizures     outgrew by age 74    History reviewed. No pertinent past surgical history.  No family history on file.  History  Substance Use Topics  . Smoking status: Former Games developer  . Smokeless tobacco: Not on file  . Alcohol Use: No      Review of Systems  Constitutional: Negative for appetite change and fatigue.  HENT: Negative for  congestion, sinus pressure and ear discharge.   Eyes: Negative for discharge.  Respiratory: Negative for cough.   Cardiovascular: Positive for leg swelling. Negative for chest pain.  Gastrointestinal: Negative for abdominal pain and diarrhea.  Genitourinary: Negative for frequency and hematuria.  Musculoskeletal: Positive for back pain.  Skin: Negative for rash.  Neurological: Negative for seizures and headaches.  Psychiatric/Behavioral: Negative for hallucinations.    Allergies  Review of patient's allergies indicates no known allergies.  Home Medications   Current Outpatient Rx  Name  Route  Sig  Dispense  Refill  . baclofen (LIORESAL) 10 MG tablet   Oral   Take 1 tablet (10 mg total) by mouth 3 (three) times daily.   21 each   0   . HYDROcodone-acetaminophen (NORCO/VICODIN) 5-325 MG per tablet   Oral   Take 1 tablet by mouth every 4 (four) hours as needed for pain.   15 tablet   0   . loratadine (CLARITIN) 10 MG tablet   Oral   Take 1 tablet (10 mg total) by mouth daily.   15 tablet   0     Triage Vitals: BP 133/67  Pulse 74  Temp(Src) 99.4 F (37.4 C) (Oral)  Resp 16  Ht 5\' 11"  (1.803 m)  Wt 303 lb (137.44 kg)  BMI 42.28 kg/m2  SpO2 98%  Physical Exam  Nursing note and vitals reviewed. Constitutional: He is oriented to person, place, and time. He appears well-developed.  HENT:  Head: Normocephalic.  Eyes: Conjunctivae and EOM are normal. No scleral icterus.  Neck: Neck supple. No thyromegaly present.  Cardiovascular: Normal rate and regular rhythm.  Exam reveals no gallop and no friction rub.   No murmur heard. Pulmonary/Chest: No stridor. He has no wheezes. He has no rales. He exhibits no tenderness.  Abdominal: He exhibits no distension. There is no tenderness. There is no rebound.  Musculoskeletal: Normal range of motion. He exhibits edema.  Minor lumbar tenderness. 1+ edema in ankles bilaterally.  Lymphadenopathy:    He has no cervical  adenopathy.  Neurological: He is oriented to person, place, and time. Coordination normal.  Skin: No rash noted. No erythema.  Psychiatric: He has a normal mood and affect. His behavior is normal.    ED Course  Procedures (including critical care time)  DIAGNOSTIC STUDIES: Oxygen Saturation is 98% on RA, normal by my interpretation.    COORDINATION OF CARE: 11:24 AM- Pt advised of plan for treatment and pt agrees.  Medications  HYDROmorphone (DILAUDID) injection 1 mg (1 mg Intramuscular Given 08/29/12 1202)      Labs Reviewed  CBC WITH DIFFERENTIAL - Abnormal; Notable for the following:    WBC 3.3 (*)    Neutro Abs 1.5 (*)    All other components within normal limits  COMPREHENSIVE METABOLIC PANEL - Abnormal; Notable for the following:    Albumin 3.4 (*)    GFR calc non Af Amer 87 (*)    All other components within normal limits  PRO B NATRIURETIC PEPTIDE   No results found.   No diagnosis found.    MDM           The chart was scribed for me under my direct supervision.  I personally performed the history, physical, and medical decision making and all procedures in the evaluation of this patient.Bobby Lennert, Bobby Bridges 08/29/12 [phone]

## 2012-09-02 ENCOUNTER — Emergency Department (HOSPITAL_COMMUNITY)
Admission: EM | Admit: 2012-09-02 | Discharge: 2012-09-03 | Disposition: A | Discharge status: Home / Self Care | Payer: Medicaid - Out of State | Attending: Emergency Medicine | Admitting: Emergency Medicine

## 2012-09-02 ENCOUNTER — Encounter (HOSPITAL_COMMUNITY): Payer: Self-pay | Admitting: Emergency Medicine

## 2012-09-02 DIAGNOSIS — R0789 Other chest pain: Secondary | ICD-10-CM

## 2012-09-02 DIAGNOSIS — F411 Generalized anxiety disorder: Secondary | ICD-10-CM | POA: Insufficient documentation

## 2012-09-02 DIAGNOSIS — F329 Major depressive disorder, single episode, unspecified: Secondary | ICD-10-CM

## 2012-09-02 DIAGNOSIS — I2510 Atherosclerotic heart disease of native coronary artery without angina pectoris: Secondary | ICD-10-CM | POA: Insufficient documentation

## 2012-09-02 DIAGNOSIS — Z87891 Personal history of nicotine dependence: Secondary | ICD-10-CM | POA: Insufficient documentation

## 2012-09-02 DIAGNOSIS — Z8669 Personal history of other diseases of the nervous system and sense organs: Secondary | ICD-10-CM | POA: Insufficient documentation

## 2012-09-02 DIAGNOSIS — R110 Nausea: Secondary | ICD-10-CM | POA: Insufficient documentation

## 2012-09-02 DIAGNOSIS — I252 Old myocardial infarction: Secondary | ICD-10-CM | POA: Insufficient documentation

## 2012-09-02 DIAGNOSIS — F3289 Other specified depressive episodes: Secondary | ICD-10-CM | POA: Insufficient documentation

## 2012-09-02 DIAGNOSIS — M545 Low back pain, unspecified: Secondary | ICD-10-CM | POA: Insufficient documentation

## 2012-09-02 DIAGNOSIS — Z79899 Other long term (current) drug therapy: Secondary | ICD-10-CM | POA: Insufficient documentation

## 2012-09-02 DIAGNOSIS — G8929 Other chronic pain: Secondary | ICD-10-CM | POA: Insufficient documentation

## 2012-09-02 DIAGNOSIS — R609 Edema, unspecified: Secondary | ICD-10-CM | POA: Insufficient documentation

## 2012-09-02 DIAGNOSIS — I10 Essential (primary) hypertension: Secondary | ICD-10-CM | POA: Insufficient documentation

## 2012-09-02 DIAGNOSIS — J45901 Unspecified asthma with (acute) exacerbation: Secondary | ICD-10-CM | POA: Insufficient documentation

## 2012-09-02 LAB — RAPID URINE DRUG SCREEN, HOSP PERFORMED
Amphetamines: NOT DETECTED
Barbiturates: NOT DETECTED
Benzodiazepines: NOT DETECTED
Cocaine: NOT DETECTED

## 2012-09-02 LAB — CBC WITH DIFFERENTIAL/PLATELET
Basophils Absolute: 0 10*3/uL (ref 0.0–0.1)
Basophils Relative: 1 % (ref 0–1)
Lymphocytes Relative: 47 % — ABNORMAL HIGH (ref 12–46)
MCHC: 33.4 g/dL (ref 30.0–36.0)
Monocytes Absolute: 0.4 10*3/uL (ref 0.1–1.0)
Neutro Abs: 1.7 10*3/uL (ref 1.7–7.7)
Neutrophils Relative %: 39 % — ABNORMAL LOW (ref 43–77)
Platelets: 216 10*3/uL (ref 150–400)
RDW: 13.1 % (ref 11.5–15.5)
WBC: 4.4 10*3/uL (ref 4.0–10.5)

## 2012-09-02 LAB — URINALYSIS, ROUTINE W REFLEX MICROSCOPIC
Bilirubin Urine: NEGATIVE
Glucose, UA: NEGATIVE mg/dL
Ketones, ur: NEGATIVE mg/dL
Nitrite: NEGATIVE
pH: 6.5 (ref 5.0–8.0)

## 2012-09-02 LAB — COMPREHENSIVE METABOLIC PANEL
ALT: 16 U/L (ref 0–53)
AST: 20 U/L (ref 0–37)
Albumin: 3.7 g/dL (ref 3.5–5.2)
Chloride: 103 mEq/L (ref 96–112)
Creatinine, Ser: 1.1 mg/dL (ref 0.50–1.35)
Potassium: 3.6 mEq/L (ref 3.5–5.1)
Sodium: 138 mEq/L (ref 135–145)
Total Bilirubin: 0.4 mg/dL (ref 0.3–1.2)

## 2012-09-02 LAB — PRO B NATRIURETIC PEPTIDE: Pro B Natriuretic peptide (BNP): 101.6 pg/mL (ref 0–125)

## 2012-09-02 MED ORDER — FUROSEMIDE 40 MG PO TABS: 40.0000 mg | ORAL_TABLET | Freq: Every day | ORAL | Status: DC

## 2012-09-02 MED ORDER — OXYCODONE-ACETAMINOPHEN 5-325 MG PO TABS: 1.0000 | ORAL_TABLET | Freq: Once | ORAL | Status: DC

## 2012-09-02 MED ORDER — CYCLOBENZAPRINE HCL 10 MG PO TABS
10.0000 mg | ORAL_TABLET | Freq: Once | ORAL | Status: AC
  Administered 2012-09-02: 10 mg via ORAL
  Filled 2012-09-02: qty 1

## 2012-09-02 MED ORDER — FUROSEMIDE 10 MG/ML IJ SOLN
40.0000 mg | Freq: Once | INTRAMUSCULAR | Status: AC
  Administered 2012-09-02: 40 mg via INTRAMUSCULAR
  Filled 2012-09-02: qty 4

## 2012-09-02 MED ORDER — CYCLOBENZAPRINE HCL 10 MG PO TABS: 10.0000 mg | ORAL_TABLET | Freq: Three times a day (TID) | ORAL | Status: DC | PRN

## 2012-09-02 MED ORDER — OXYCODONE-ACETAMINOPHEN 7.5-325 MG PO TABS: 1.0000 | ORAL_TABLET | ORAL | Status: DC | PRN

## 2012-09-02 MED ORDER — CITALOPRAM HYDROBROMIDE 20 MG PO TABS: 20.0000 mg | ORAL_TABLET | Freq: Every day | ORAL | Status: DC

## 2012-09-02 MED ORDER — NAPROXEN 500 MG PO TABS: 500.0000 mg | ORAL_TABLET | Freq: Two times a day (BID) | ORAL | Status: DC

## 2012-09-02 MED ORDER — ASPIRIN 81 MG PO CHEW
324.0000 mg | CHEWABLE_TABLET | Freq: Once | ORAL | Status: AC
  Administered 2012-09-02: 324 mg via ORAL
  Filled 2012-09-02: qty 4

## 2012-09-02 MED ORDER — KETOROLAC TROMETHAMINE 30 MG/ML IJ SOLN
30.0000 mg | Freq: Once | INTRAMUSCULAR | Status: AC
  Administered 2012-09-02: 30 mg via INTRAVENOUS
  Filled 2012-09-02: qty 1

## 2012-09-02 MED ORDER — OXYCODONE-ACETAMINOPHEN 5-325 MG PO TABS
2.0000 | ORAL_TABLET | Freq: Once | ORAL | Status: AC
  Administered 2012-09-02: 2 via ORAL
  Filled 2012-09-02: qty 2

## 2012-09-02 NOTE — ED Notes (Signed)
Pt states he is stressed out and thinking about things at home when the chest pain started today at 3pm. Pt c/o bilateral leg swelling and depression. Pt denies any si/hi.

## 2012-09-02 NOTE — ED Provider Notes (Signed)
History     CSN: [id number]  Arrival date & time 09/02/12  4098   First MD Initiated Contact with Patient 09/02/12 1918      Chief Complaint  Patient presents with  . Chest Pain  . Shortness of Breath  . Anxiety    (Consider location/radiation/quality/duration/timing/severity/associated sxs/prior treatment) Patient is a 37 y.o. male presenting with chest pain, shortness of breath, and anxiety. The history is provided by the patient.  Chest Pain Associated symptoms: anxiety and shortness of breath   Shortness of Breath Associated symptoms: chest pain   Anxiety Associated symptoms include chest pain and shortness of breath.  He has been having episodes of chest pain today. It's a dull pain in the left chest with occasional radiation to the back. Pain is severe and he rates it at 9/10. When present, it is worse with deep breathing, with movement, and also worse when laying flat. There is associated dyspnea, and nausea but no diaphoresis. Pain lasts about an hour. Pain seems to come on when he has increased stress and feels more depressed. He has a history of depression and has been followed [facility], but has not been seen there in at least 6 months. He also is complaining of increased low back pain and he has noted swelling in his legs. He was seen in the ED 4 days ago and started on a water pill but his legs are still swelling. His leg swelling is worse when he stands. 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 He has been taking Percocet for pain but it is not giving him relief. He has been evaluated for chest pain in the past and pain today is similar. He is a former smoker, former drinker, former drug user. He does have a cardiac history with history of myocardial infarction. He also has a history of sleep apnea. He states that his depression is worse today but with no particular precipitating event. He has been having a spontaneous crying spells, though the morning  awakening, and anhedonia. He denies suicidal ideation and he denies hallucinations. He does wish to be used given medication for his depression.    Past Medical History  Diagnosis Date  . Coronary artery disease   . Myocardial infarction   . Hypertension   . Anxiety   . Depression   . Asthma   . Seizures     outgrew by age 18    History reviewed. No pertinent past surgical history.  History reviewed. No pertinent family history.  History  Substance Use Topics  . Smoking status: Former Games developer  . Smokeless tobacco: Not on file  . Alcohol Use: No      Review of Systems  Respiratory: Positive for shortness of breath.   Cardiovascular: Positive for chest pain.  All other systems reviewed and are negative.    Allergies  Review of patient's allergies indicates no known allergies.  Home Medications   Current Outpatient Rx  Name  Route  Sig  Dispense  Refill  . baclofen (LIORESAL) 10 MG tablet   Oral   Take 1 tablet (10 mg total) by mouth 3 (three) times daily.   21 each   0   . furosemide (LASIX) 20 MG tablet   Oral   Take 1 tablet (20 mg total) by mouth daily.   7 tablet   0   . HYDROcodone-acetaminophen (NORCO/VICODIN) 5-325 MG per tablet   Oral   Take 1 tablet by mouth  every 4 (four) hours as needed for pain.   15 tablet   0   . loratadine (CLARITIN) 10 MG tablet   Oral   Take 1 tablet (10 mg total) by mouth daily.   15 tablet   0   . oxyCODONE-acetaminophen (PERCOCET/ROXICET) 5-325 MG per tablet   Oral   Take 1 tablet by mouth every 6 (six) hours as needed for pain.   30 tablet   0     BP 113/87  Pulse 82  Temp(Src) 97.1 F (36.2 C) (Oral)  Resp 20  Ht 5\' 11"  (1.803 m)  Wt 302 lb 9 oz (137.241 kg)  BMI 42.22 kg/m2  SpO2 100%  Physical Exam  Nursing note and vitals reviewed.  37 year old male, who is intermittently crying, but his in no acute distress. Vital signs are normal . Oxygen saturation is 100%, which is normal. Head is  normocephalic and atraumatic. PERRLA, EOMI, but with right eye deviating laterally. Oropharynx is clear. Neck is nontender and supple without adenopathy or JVD. Back is  Moderately tender throughout the lumbar area. There  is no CVA tenderness. Lungs are clear without rales, wheezes, or rhonchi. Chest is  moderately tender in the left anterior chest and does reproduce his pain . Heart has regular rate and rhythm without murmur. Abdomen is soft, flat, nontender without masses or hepatosplenomegaly and peristalsis is normoactive. Extremities have 1+ edema, full range of motion is present. Skin is warm and dry without rash.  Neurologic: Mental status is normal, cranial nerves are intact, there are no motor or sensory deficits. Psychiatric: Depressed affect with crying spells.   ED Course  Procedures (including critical care time)  Results for orders placed during the hospital encounter of 09/02/12  CBC WITH DIFFERENTIAL      Result Value Range   WBC 4.4  4.0 - 10.5 K/uL   RBC 5.06  4.22 - 5.81 MIL/uL   Hemoglobin 14.1  13.0 - 17.0 g/dL   HCT 40.9  81.1 - 91.4 %   MCV 83.4  78.0 - 100.0 fL   MCH 27.9  26.0 - 34.0 pg   MCHC 33.4  30.0 - 36.0 g/dL   RDW 78.2  95.6 - 21.3 %   Platelets 216  150 - 400 K/uL   Neutrophils Relative % 39 (*) 43 - 77 %   Neutro Abs 1.7  1.7 - 7.7 K/uL   Lymphocytes Relative 47 (*) 12 - 46 %   Lymphs Abs 2.1  0.7 - 4.0 K/uL   Monocytes Relative 10  3 - 12 %   Monocytes Absolute 0.4  0.1 - 1.0 K/uL   Eosinophils Relative 3  0 - 5 %   Eosinophils Absolute 0.2  0.0 - 0.7 K/uL   Basophils Relative 1  0 - 1 %   Basophils Absolute 0.0  0.0 - 0.1 K/uL  COMPREHENSIVE METABOLIC PANEL      Result Value Range   Sodium 138  135 - 145 mEq/L   Potassium 3.6  3.5 - 5.1 mEq/L   Chloride 103  96 - 112 mEq/L   CO2 27  19 - 32 mEq/L   Glucose, Bld 92  70 - 99 mg/dL   BUN 14  6 - 23 mg/dL   Creatinine, Ser 0.86  0.50 - 1.35 mg/dL   Calcium 9.3  8.4 - 57.8 mg/dL   Total  Protein 7.2  6.0 - 8.3 g/dL   Albumin 3.7  3.5 - 5.2  g/dL   AST 20  0 - 37 U/L   ALT 16  0 - 53 U/L   Alkaline Phosphatase 74  39 - 117 U/L   Total Bilirubin 0.4  0.3 - 1.2 mg/dL   GFR calc non Af Amer 86 (*) >90 mL/min   GFR calc Af Amer >90  >90 mL/min  TROPONIN I      Result Value Range   Troponin I <0.30  <0.30 ng/mL  PRO B NATRIURETIC PEPTIDE      Result Value Range   Pro B Natriuretic peptide (BNP) 101.6  0 - 125 pg/mL  ETHANOL      Result Value Range   Alcohol, Ethyl (B) <11  0 - 11 mg/dL  URINE RAPID DRUG SCREEN (HOSP PERFORMED)      Result Value Range   Opiates NONE DETECTED  NONE DETECTED   Cocaine NONE DETECTED  NONE DETECTED   Benzodiazepines NONE DETECTED  NONE DETECTED   Amphetamines NONE DETECTED  NONE DETECTED   Tetrahydrocannabinol NONE DETECTED  NONE DETECTED   Barbiturates NONE DETECTED  NONE DETECTED  URINALYSIS, ROUTINE W REFLEX MICROSCOPIC      Result Value Range   Color, Urine YELLOW  YELLOW   APPearance CLEAR  CLEAR   Specific Gravity, Urine <1.005 (*) 1.005 - 1.030   pH 6.5  5.0 - 8.0   Glucose, UA NEGATIVE  NEGATIVE mg/dL   Hgb urine dipstick NEGATIVE  NEGATIVE   Bilirubin Urine NEGATIVE  NEGATIVE   Ketones, ur NEGATIVE  NEGATIVE mg/dL   Protein, ur NEGATIVE  NEGATIVE mg/dL   Urobilinogen, UA 0.2  0.0 - 1.0 mg/dL   Nitrite NEGATIVE  NEGATIVE   Leukocytes, UA NEGATIVE  NEGATIVE     Date: 09/02/2012  Rate: 79  Rhythm: normal sinus rhythm  QRS Axis: right  Intervals: normal  ST/T Wave abnormalities: normal  Conduction Disutrbances:none  Narrative Interpretation: Right axis deviation. When compared with ECG of 01/15/2012, no significant changes are seen.  Old EKG Reviewed: unchanged   1. Musculoskeletal chest pain   2. Low back pain   3. Peripheral edema   4. Depression       MDM  Chest pain which seems musculoskeletal in origin. Worsening depression but without suicidal ideation. I do not believe his depression is severe enough to  warrant inpatient care. Chronic low back pain. Peripheral edema. Old records are reviewed and he has had several ED visits for musculoskeletal chest pain and had a recent ED visit for peripheral edema. Screening labs will be repeated and he will be given aspirin and ketorolac. You have a given dose of furosemide for his edema. Psychiatric consultation will be obtained to try to get him on appropriate antidepressants.   9:32 PM He had a virtually no relief of back pain with ketorolac. Laboratory workup is unremarkable. He has had good diuresis with furosemide. Psychiatric consultation has been requested. He'll be given narcotic analgesics. Case is endorsed to Dr. [name] to evaluate patient following psychiatric consultation. Psychiatrist agrees that he can go home, and he will be given prescriptions for acetaminophen-oxycodone 325-7.5, cyclobenzaprine, furosemide 40 mg, and naproxen. Antidepressant medication will be based on psychiatric consultation.  [name], MD 09/02/12 2208

## 2012-09-02 NOTE — ED Provider Notes (Signed)
Telepsych done and suggested Celexa with outpatient follow up. Discharged.  Bobby Bridges. [name], MD 09/02/12 [phone]

## 2012-09-07 ENCOUNTER — Emergency Department (HOSPITAL_COMMUNITY): Payer: Self-pay

## 2012-09-07 ENCOUNTER — Emergency Department (HOSPITAL_COMMUNITY)
Admission: EM | Admit: 2012-09-07 | Discharge: 2012-09-07 | Disposition: A | Discharge status: Home / Self Care | Payer: Self-pay | Attending: Emergency Medicine | Admitting: Emergency Medicine

## 2012-09-07 ENCOUNTER — Encounter (HOSPITAL_COMMUNITY): Payer: Self-pay

## 2012-09-07 DIAGNOSIS — Z87891 Personal history of nicotine dependence: Secondary | ICD-10-CM | POA: Insufficient documentation

## 2012-09-07 DIAGNOSIS — Z8669 Personal history of other diseases of the nervous system and sense organs: Secondary | ICD-10-CM | POA: Insufficient documentation

## 2012-09-07 DIAGNOSIS — Z79899 Other long term (current) drug therapy: Secondary | ICD-10-CM | POA: Insufficient documentation

## 2012-09-07 DIAGNOSIS — F329 Major depressive disorder, single episode, unspecified: Secondary | ICD-10-CM | POA: Insufficient documentation

## 2012-09-07 DIAGNOSIS — G8929 Other chronic pain: Secondary | ICD-10-CM | POA: Insufficient documentation

## 2012-09-07 DIAGNOSIS — F419 Anxiety disorder, unspecified: Secondary | ICD-10-CM

## 2012-09-07 DIAGNOSIS — I10 Essential (primary) hypertension: Secondary | ICD-10-CM | POA: Insufficient documentation

## 2012-09-07 DIAGNOSIS — F411 Generalized anxiety disorder: Secondary | ICD-10-CM | POA: Insufficient documentation

## 2012-09-07 DIAGNOSIS — F3289 Other specified depressive episodes: Secondary | ICD-10-CM | POA: Insufficient documentation

## 2012-09-07 DIAGNOSIS — R609 Edema, unspecified: Secondary | ICD-10-CM | POA: Insufficient documentation

## 2012-09-07 DIAGNOSIS — J45909 Unspecified asthma, uncomplicated: Secondary | ICD-10-CM | POA: Insufficient documentation

## 2012-09-07 DIAGNOSIS — M545 Low back pain, unspecified: Secondary | ICD-10-CM | POA: Insufficient documentation

## 2012-09-07 HISTORY — DX: Other chronic pain: G89.29

## 2012-09-07 HISTORY — DX: Dorsalgia, unspecified: M54.9

## 2012-09-07 LAB — RAPID URINE DRUG SCREEN, HOSP PERFORMED
Amphetamines: NOT DETECTED
Benzodiazepines: NOT DETECTED

## 2012-09-07 LAB — BASIC METABOLIC PANEL
BUN: 15 mg/dL (ref 6–23)
Calcium: 9.3 mg/dL (ref 8.4–10.5)
Creatinine, Ser: 1.27 mg/dL (ref 0.50–1.35)
GFR calc Af Amer: 83 mL/min — ABNORMAL LOW (ref >90)
GFR calc non Af Amer: 72 mL/min — ABNORMAL LOW (ref >90)

## 2012-09-07 LAB — CBC WITH DIFFERENTIAL/PLATELET
Basophils Relative: 1 % (ref 0–1)
Eosinophils Absolute: 0.1 10*3/uL (ref 0.0–0.7)
Eosinophils Relative: 2 % (ref 0–5)
HCT: 43.2 % (ref 39.0–52.0)
Hemoglobin: 14.5 g/dL (ref 13.0–17.0)
MCH: 28.3 pg (ref 26.0–34.0)
MCHC: 33.6 g/dL (ref 30.0–36.0)
Monocytes Absolute: 0.5 10*3/uL (ref 0.1–1.0)
Monocytes Relative: 8 % (ref 3–12)

## 2012-09-07 LAB — URINALYSIS, ROUTINE W REFLEX MICROSCOPIC
Glucose, UA: NEGATIVE mg/dL
Hgb urine dipstick: NEGATIVE
Ketones, ur: NEGATIVE mg/dL
Protein, ur: NEGATIVE mg/dL

## 2012-09-07 NOTE — ED Provider Notes (Signed)
History     CSN: [id number]  Arrival date & time 09/07/12  1705   First MD Initiated Contact with Patient 09/07/12 1837      Chief Complaint  Patient presents with  . Back Pain  . Anxiety     HPI Pt was seen at 1840.  Per pt, c/o gradual onset and persistence of constant acute flair of his chronic low back "pain" for the past several days.  Denies any change in his usual chronic pain pattern for the past 1 year.  Pain worsens with palpation of the area and body position changes. Denies incont/retention of bowel or bladder, no saddle anesthesia, no focal motor weakness, no tingling/numbness in extremities, no fevers, no injury, no abd pain. The patient has a significant history of similar symptoms previously, recently being evaluated for this complaint and multiple prior evals for same. Pt also c/o gradual onset and persistence of constant pedal edema that began several weeks ago. Pt states he "ran out of my lasix" and feels the "swelling is getting worse." Pt is requesting a prescription refill. Denies focal motor weakness, no tingling/numbness in extremities, no CP/palpitations, no SOB/cough, no fevers, no rash. Pt also c/o gradual onset and persistence of constant acute flair of his chronic anxiety that began after "getting into a fight with my wife" earlier this evening.  Denies SI, no SA, no HI, no hallucinations.  Pt states he "needs a prescription for my depression" because he "doesn't have one."      Past Medical History  Diagnosis Date  . Hypertension   . Anxiety   . Depression   . Asthma   . Seizures     outgrew by age 63  . Chronic back pain     History reviewed. No pertinent past surgical history.    History  Substance Use Topics  . Smoking status: Former Games developer  . Smokeless tobacco: Not on file  . Alcohol Use: No     Review of Systems ROS: Statement: All systems negative except as marked or noted in the HPI; Constitutional: Negative for fever and chills. ; ;  Eyes: Negative for eye pain, redness and discharge. ; ; ENMT: Negative for ear pain, hoarseness, nasal congestion, sinus pressure and sore throat. ; ; Cardiovascular: Negative for chest pain, palpitations, diaphoresis, dyspnea and +peripheral edema. ; ; Respiratory: Negative for cough, wheezing and stridor. ; ; Gastrointestinal: Negative for nausea, vomiting, diarrhea, abdominal pain, blood in stool, hematemesis, jaundice and rectal bleeding. . ; ; Genitourinary: Negative for dysuria, flank pain and hematuria. ; ; Musculoskeletal: +LBP. Negative for neck pain. Negative for swelling and trauma.; ; Skin: Negative for pruritus, rash, abrasions, blisters, bruising and skin lesion.; ; Neuro: Negative for headache, lightheadedness and neck stiffness. Negative for weakness, altered level of consciousness , altered mental status, extremity weakness, paresthesias, involuntary movement, seizure and syncope.; Psych:  +anxiety. No SI, no SA, no HI, no hallucinations.      Allergies  Review of patient's allergies indicates no known allergies.  Home Medications   Current Outpatient Rx  Name  Route  Sig  Dispense  Refill  . baclofen (LIORESAL) 10 MG tablet   Oral   Take 1 tablet (10 mg total) by mouth 3 (three) times daily.   21 each   0   . citalopram (CELEXA) 20 MG tablet   Oral   Take 1 tablet (20 mg total) by mouth daily.   30 tablet   0   . cyclobenzaprine (FLEXERIL) 10  MG tablet   Oral   Take 1 tablet (10 mg total) by mouth 3 (three) times daily as needed for muscle spasms.   30 tablet   0   . furosemide (LASIX) 40 MG tablet   Oral   Take 1 tablet (40 mg total) by mouth daily.   30 tablet   0   . loratadine (CLARITIN) 10 MG tablet   Oral   Take 1 tablet (10 mg total) by mouth daily.   15 tablet   0   . naproxen (NAPROSYN) 500 MG tablet   Oral   Take 1 tablet (500 mg total) by mouth 2 (two) times daily.   30 tablet   0   . oxyCODONE-acetaminophen (PERCOCET) 7.5-325 MG per  tablet   Oral   Take 1 tablet by mouth every 4 (four) hours as needed for pain.   30 tablet   0     BP 114/79  Pulse 96  Temp(Src) 97.9 F (36.6 C) (Oral)  Resp 18  Ht 5\' 11"  (1.803 m)  Wt 302 lb (136.986 kg)  BMI 42.14 kg/m2  SpO2 100%  Physical Exam 1845: Physical examination:  Nursing notes reviewed; Vital signs and O2 SAT reviewed;  Constitutional: Well developed, Well nourished, Well hydrated, In no acute distress; Head:  Normocephalic, atraumatic; Eyes: EOMI, PERRL, No scleral icterus; ENMT: Mouth and pharynx normal, Mucous membranes moist; Neck: Supple, Full range of motion, No lymphadenopathy; Cardiovascular: Regular rate and rhythm, No murmur, rub, or gallop; Respiratory: Breath sounds clear & equal bilaterally, No rales, rhonchi, wheezes.  Speaking full sentences with ease, Normal respiratory effort/excursion; Chest: Nontender, Movement normal; Abdomen: Soft, Nontender, Nondistended, Normal bowel sounds; Genitourinary: No CVA tenderness; Spine:  No midline CS, TS, LS tenderness. +mild TTP bilat lumbar paraspinal muscles;; Extremities: Pulses normal, No tenderness, +1 pedal edema bilat. No calf edema or asymmetry.; Neuro: AA&Ox3, Major CN grossly intact.  Speech clear. Climbs on and off stretcher by himself easily. Gait steady. No gross focal motor or sensory deficits in extremities.; Skin: Color normal, Warm, Dry.; Psych:  Affect flat, poor eye contact. Denies SI.     ED Course  Procedures   1850:  Pt states he "only had 7 lasix left" and "ran out of them." States the same with his narcotic pain meds ("ran out").  Pt also states he "doesn't have any meds for depression" and has not been eval by a mental health provider in "a while."  Pt reminded of his ED visit 4 days ago, when he had a full medical workup and spoke with a Psychiatrist; as well as was rx several medications, including lasix, percocet and celexa. Pt states that "no one gave me any prescriptions" and "no I didn't."   Pt reminded again by myself and ED RN regarding his last ED visit/workup/rx/etc. Pt replied "oh ok then."    2015:  No acute findings on workup today. [place] Controlled Substance Database accessed: pt filled oxycodone/APAP 10/325mg , #30, rx by Dr. Estell Harpin on 08/31/12; filled hydrocodone/APAP 5/325mg , #15, rx by PA Beverely Pace on 08/22/12. Pt also received rx oxycodone/APA 7.5/325mg  tabs #30 on 09/02/12.  Pt's UDS negative for opiates today. Concerned regarding possible diversion. Pt informed that I will not be prescribing him any medications today, has he just received celexa, flexeril, lasix, naprosyn, and percocet on 09/02/12. Pt verb understanding and states he wants to go home now. Dx and testing d/w pt and family.  Questions answered.  Verb understanding, agreeable to d/c home with  outpt f/u.     MDM  MDM Reviewed: previous chart, nursing note and vitals Reviewed previous: labs and x-ray Interpretation: labs and x-ray   Results for orders placed during the hospital encounter of 09/07/12  CBC WITH DIFFERENTIAL      Result Value Range   WBC 6.2  4.0 - 10.5 K/uL   RBC 5.12  4.22 - 5.81 MIL/uL   Hemoglobin 14.5  13.0 - 17.0 g/dL   HCT 47.8  29.5 - 62.1 %   MCV 84.4  78.0 - 100.0 fL   MCH 28.3  26.0 - 34.0 pg   MCHC 33.6  30.0 - 36.0 g/dL   RDW 30.8  65.7 - 84.6 %   Platelets 218  150 - 400 K/uL   Neutrophils Relative % 45  43 - 77 %   Neutro Abs 2.8  1.7 - 7.7 K/uL   Lymphocytes Relative 45  12 - 46 %   Lymphs Abs 2.8  0.7 - 4.0 K/uL   Monocytes Relative 8  3 - 12 %   Monocytes Absolute 0.5  0.1 - 1.0 K/uL   Eosinophils Relative 2  0 - 5 %   Eosinophils Absolute 0.1  0.0 - 0.7 K/uL   Basophils Relative 1  0 - 1 %   Basophils Absolute 0.0  0.0 - 0.1 K/uL  BASIC METABOLIC PANEL      Result Value Range   Sodium 139  135 - 145 mEq/L   Potassium 3.8  3.5 - 5.1 mEq/L   Chloride 105  96 - 112 mEq/L   CO2 28  19 - 32 mEq/L   Glucose, Bld 91  70 - 99 mg/dL   BUN 15  6 - 23 mg/dL   Creatinine,  Ser 9.62  0.50 - 1.35 mg/dL   Calcium 9.3  8.4 - 95.2 mg/dL   GFR calc non Af Amer 72 (*) >90 mL/min   GFR calc Af Amer 83 (*) >90 mL/min  PRO B NATRIURETIC PEPTIDE      Result Value Range   Pro B Natriuretic peptide (BNP) 110.9  0 - 125 pg/mL  URINE RAPID DRUG SCREEN (HOSP PERFORMED)      Result Value Range   Opiates NONE DETECTED  NONE DETECTED   Cocaine NONE DETECTED  NONE DETECTED   Benzodiazepines NONE DETECTED  NONE DETECTED   Amphetamines NONE DETECTED  NONE DETECTED   Tetrahydrocannabinol NONE DETECTED  NONE DETECTED   Barbiturates NONE DETECTED  NONE DETECTED  URINALYSIS, ROUTINE W REFLEX MICROSCOPIC      Result Value Range   Color, Urine YELLOW  YELLOW   APPearance HAZY (*) CLEAR   Specific Gravity, Urine 1.015  1.005 - 1.030   pH 8.0  5.0 - 8.0   Glucose, UA NEGATIVE  NEGATIVE mg/dL   Hgb urine dipstick NEGATIVE  NEGATIVE   Bilirubin Urine NEGATIVE  NEGATIVE   Ketones, ur NEGATIVE  NEGATIVE mg/dL   Protein, ur NEGATIVE  NEGATIVE mg/dL   Urobilinogen, UA 2.0 (*) 0.0 - 1.0 mg/dL   Nitrite NEGATIVE  NEGATIVE   Leukocytes, UA NEGATIVE  NEGATIVE   Dg Lumbar Spine Complete 09/07/2012   *RADIOLOGY REPORT*  Clinical Data: Low back pain.  Prior lifting injury.  LUMBAR SPINE - COMPLETE 4+ VIEW  Comparison: 05/30/2012 and 05/07/2012 radiographs.  Findings: There is a convex left scoliosis with left-sided paraspinal osteophytes.  The lateral alignment appears near anatomic.  There is no evidence of acute fracture or pars defect. There  is probable mild disc space narrowing at L4-L5 which appears stable.  IMPRESSION: Stable scoliosis and paraspinal osteophytes.  No acute osseous findings demonstrated.   Original Report Authenticated By: Carey Bullocks, M.D.       Laray Anger, DO 09/09/12 1946

## 2012-09-07 NOTE — ED Notes (Signed)
Pt states his back is still hurting even after getting pain meds. Pt also states his nerves are bad after arguing with his wife. Pt states a little better since he has been here

## 2012-09-07 NOTE — ED Notes (Signed)
Pt c/o lower back pain for over a year, recently seen in er for same, states that the pain medication given in not working.  C/o bilateral lower feet and ankle swelling that became worse yesterday after running out of his lasix medication C/o anxiety after getting into an argument with his wife today.

## 2012-09-07 NOTE — ED Notes (Signed)
Pt c/o chronic pain in mid and lower back x 1 year.  Reports was seen here 2 days ago and was given pain medication.  Reports pain medication isn't helping.  Pt also says, "my nerves are bad."  Denies any SI or HI.

## 2012-09-07 NOTE — ED Notes (Signed)
Pt alert & oriented x4, stable gait. Patient given discharge instructions, paperwork & prescription(s). Patient  instructed to stop at the registration desk to finish any additional paperwork. Patient verbalized understanding. Pt left department w/ no further questions. 

## 2012-09-07 NOTE — ED Notes (Signed)
Pt ambulated to restroom & returned to room w/ no complications. 

## 2012-09-08 LAB — URINE CULTURE: Colony Count: 60000

## 2012-09-11 ENCOUNTER — Encounter (HOSPITAL_COMMUNITY): Payer: Self-pay | Admitting: Emergency Medicine

## 2012-09-11 ENCOUNTER — Emergency Department (HOSPITAL_COMMUNITY)
Admission: EM | Admit: 2012-09-11 | Discharge: 2012-09-11 | Disposition: A | Discharge status: Home / Self Care | Payer: Self-pay | Attending: Emergency Medicine | Admitting: Emergency Medicine

## 2012-09-11 ENCOUNTER — Emergency Department (HOSPITAL_COMMUNITY): Payer: Self-pay

## 2012-09-11 DIAGNOSIS — J069 Acute upper respiratory infection, unspecified: Secondary | ICD-10-CM | POA: Insufficient documentation

## 2012-09-11 DIAGNOSIS — F3289 Other specified depressive episodes: Secondary | ICD-10-CM | POA: Insufficient documentation

## 2012-09-11 DIAGNOSIS — Z87891 Personal history of nicotine dependence: Secondary | ICD-10-CM | POA: Insufficient documentation

## 2012-09-11 DIAGNOSIS — Z79899 Other long term (current) drug therapy: Secondary | ICD-10-CM | POA: Insufficient documentation

## 2012-09-11 DIAGNOSIS — F329 Major depressive disorder, single episode, unspecified: Secondary | ICD-10-CM | POA: Insufficient documentation

## 2012-09-11 DIAGNOSIS — M542 Cervicalgia: Secondary | ICD-10-CM | POA: Insufficient documentation

## 2012-09-11 DIAGNOSIS — R197 Diarrhea, unspecified: Secondary | ICD-10-CM | POA: Insufficient documentation

## 2012-09-11 DIAGNOSIS — F411 Generalized anxiety disorder: Secondary | ICD-10-CM | POA: Insufficient documentation

## 2012-09-11 DIAGNOSIS — R0602 Shortness of breath: Secondary | ICD-10-CM | POA: Insufficient documentation

## 2012-09-11 DIAGNOSIS — J45909 Unspecified asthma, uncomplicated: Secondary | ICD-10-CM | POA: Insufficient documentation

## 2012-09-11 DIAGNOSIS — R51 Headache: Secondary | ICD-10-CM | POA: Insufficient documentation

## 2012-09-11 DIAGNOSIS — R110 Nausea: Secondary | ICD-10-CM | POA: Insufficient documentation

## 2012-09-11 DIAGNOSIS — Z8669 Personal history of other diseases of the nervous system and sense organs: Secondary | ICD-10-CM | POA: Insufficient documentation

## 2012-09-11 DIAGNOSIS — R6883 Chills (without fever): Secondary | ICD-10-CM | POA: Insufficient documentation

## 2012-09-11 DIAGNOSIS — R059 Cough, unspecified: Secondary | ICD-10-CM | POA: Insufficient documentation

## 2012-09-11 DIAGNOSIS — J029 Acute pharyngitis, unspecified: Secondary | ICD-10-CM | POA: Insufficient documentation

## 2012-09-11 DIAGNOSIS — R062 Wheezing: Secondary | ICD-10-CM | POA: Insufficient documentation

## 2012-09-11 DIAGNOSIS — I10 Essential (primary) hypertension: Secondary | ICD-10-CM | POA: Insufficient documentation

## 2012-09-11 DIAGNOSIS — R05 Cough: Secondary | ICD-10-CM | POA: Insufficient documentation

## 2012-09-11 NOTE — ED Notes (Signed)
Patient c/o headache with nasal congestion that started 2 days ago. Unsure of any fevers but reports "hot flashes." Patient also c/o neck/back pain. Denies taking any medications for congestion. Patient reports taking percocet for pain with no relief.

## 2012-09-11 NOTE — ED Provider Notes (Signed)
History  This chart was scribed for Ward Givens, MD by Bennett Scrape, ED Scribe. This patient was seen in room [facility]05/[facility]05 and the patient's care was started at 7:26 PM.  CSN: [id number]  Arrival date & time 09/11/12  0707   First MD Initiated Contact with Patient 09/11/12 [phone]      Chief Complaint  Patient presents with  . Headache  . Nasal Congestion  . Neck Pain     The history is provided by the patient. No language interpreter was used.    HPI Comments: Bobby Bridges is a 37 y.o. male who presents to the Emergency Department complaining of 2 days of gradual onset, gradually worsening, constant nasal congestion described as clear with associated cough productive of yellow mucus, sore throat described as sore and itchy, sneezing,  SOB, wheezing, chills, nausea and diarrhea as associated symptoms. He reports two episodes of diarrhea last night and one this morning. The nasal congestion begins to drain with ambulation. Pt denies taking OTC medications at home to improve symptoms. He denies emesis, fever and CP as associated symptoms. Pt reports that his sister is also sick at home with similar symptoms. He is former smoker and alcohol user, quit both last year.  He reports that he is currently on 2 fluid pills, pain medication and celexa for depression currently.   No PCP currently. Is scheduled to see Dr. Delbert Harness in July.  Past Medical History  Diagnosis Date  . Hypertension   . Anxiety   . Depression   . Asthma   . Seizures     outgrew by age 76  . Chronic back pain     History reviewed. No pertinent past surgical history.  History reviewed. No pertinent family history.  History  Substance Use Topics  . Smoking status: Former Smoker -- 1.00 packs/day for 2 years    Types: Cigarettes    Quit date: 04/08/2012  . Smokeless tobacco: Never Used  . Alcohol Use: No   Lives with sister Unemployed, used to work as a Retail banker   Review of Systems   Constitutional: Positive for chills. Negative for fever.  HENT: Positive for congestion and sore throat. Negative for ear pain.   Respiratory: Positive for cough, shortness of breath and wheezing. Negative for chest tightness.   Cardiovascular: Negative for chest pain.  Gastrointestinal: Positive for nausea and diarrhea. Negative for vomiting and abdominal pain.  All other systems reviewed and are negative.    Allergies  Review of patient's allergies indicates no known allergies.  Home Medications   Current Outpatient Rx  Name  Route  Sig  Dispense  Refill  . baclofen (LIORESAL) 10 MG tablet   Oral   Take 1 tablet (10 mg total) by mouth 3 (three) times daily.   21 each   0   . citalopram (CELEXA) 20 MG tablet   Oral   Take 1 tablet (20 mg total) by mouth daily.   30 tablet   0   . cyclobenzaprine (FLEXERIL) 10 MG tablet   Oral   Take 1 tablet (10 mg total) by mouth 3 (three) times daily as needed for muscle spasms.   30 tablet   0   . furosemide (LASIX) 40 MG tablet   Oral   Take 1 tablet (40 mg total) by mouth daily.   30 tablet   0   . loratadine (CLARITIN) 10 MG tablet   Oral   Take 1 tablet (10 mg total)  by mouth daily.   15 tablet   0   . naproxen (NAPROSYN) 500 MG tablet   Oral   Take 1 tablet (500 mg total) by mouth 2 (two) times daily.   30 tablet   0   . oxyCODONE-acetaminophen (PERCOCET) 7.5-325 MG per tablet   Oral   Take 1 tablet by mouth every 4 (four) hours as needed for pain.   30 tablet   0     Triage Vitals: BP 115/79  Pulse 95  Temp(Src) 98.8 F (37.1 C) (Oral)  Resp 20  Ht 5\' 11"  (1.803 m)  Wt 302 lb (136.986 kg)  BMI 42.14 kg/m2  SpO2 98%  Vital signs normal    Physical Exam  Nursing note and vitals reviewed. Constitutional: He is oriented to person, place, and time. He appears well-developed and well-nourished.  Non-toxic appearance. He does not appear ill. No distress.  Pt sounds like he has nasal congestion  HENT:   Head: Normocephalic and atraumatic.  Right Ear: External ear normal.  Left Ear: External ear normal.  Nose: Nose normal. No mucosal edema or rhinorrhea.  Mouth/Throat: Oropharynx is clear and moist and mucous membranes are normal. No dental abscesses or edematous.  Eyes: Conjunctivae and EOM are normal. Pupils are equal, round, and reactive to light.  Right eye is externally rotated   Neck: Normal range of motion and full passive range of motion without pain. Neck supple.  Cardiovascular: Normal rate, regular rhythm and normal heart sounds.  Exam reveals no gallop and no friction rub.   No murmur heard. Pulmonary/Chest: Effort normal and breath sounds normal. No respiratory distress. He has no wheezes. He has no rhonchi. He has no rales. He exhibits no tenderness and no crepitus.  Abdominal: Soft. Normal appearance and bowel sounds are normal. He exhibits no distension. There is no tenderness. There is no rebound and no guarding.  Musculoskeletal: Normal range of motion. He exhibits no edema and no tenderness.  Moves all extremities well.   Neurological: He is alert and oriented to person, place, and time. He has normal strength. No cranial nerve deficit.  Skin: Skin is warm, dry and intact. No rash noted. No erythema. No pallor.  Psychiatric: He has a normal mood and affect. His speech is normal and behavior is normal. His mood appears not anxious.    ED Course  Procedures (including critical care time)  DIAGNOSTIC STUDIES: Oxygen Saturation is 98% on room air, normal by my interpretation.    COORDINATION OF CARE: 7:40 AM-Advised pt that he will have to f/u with Dr. Delbert Harness for his chronic neck pain. Discussed treatment plan which includes CXR with pt at bedside and pt agreed to plan.   This is this patient's 15th ED visit in the past 6 months. Most are for chronic back pain and neck pain.  8:26 AM-Informed pt of negative radiology results. Discussed discharge plan which includes  OTC medications for his upper respiratory tract infection or cold with pt and pt agreed to plan. Also advised pt to follow up as needed and pt agreed. Addressed symptoms to return for with pt.   Results for orders placed during the hospital encounter of 09/07/12  URINE CULTURE      Result Value Range   Specimen Description URINE, CLEAN CATCH     Special Requests NONE     Culture  Setup Time 09/07/2012 20:05     Colony Count 60,000 COLONIES/ML     Culture  Value: LACTOBACILLUS SPECIES     Note: Standardized susceptibility testing for this organism is not available.   Report Status 09/08/2012 FINAL    CBC WITH DIFFERENTIAL      Result Value Range   WBC 6.2  4.0 - 10.5 K/uL   RBC 5.12  4.22 - 5.81 MIL/uL   Hemoglobin 14.5  13.0 - 17.0 g/dL   HCT 41.3  24.4 - 01.0 %   MCV 84.4  78.0 - 100.0 fL   MCH 28.3  26.0 - 34.0 pg   MCHC 33.6  30.0 - 36.0 g/dL   RDW 27.2  53.6 - 64.4 %   Platelets 218  150 - 400 K/uL   Neutrophils Relative % 45  43 - 77 %   Neutro Abs 2.8  1.7 - 7.7 K/uL   Lymphocytes Relative 45  12 - 46 %   Lymphs Abs 2.8  0.7 - 4.0 K/uL   Monocytes Relative 8  3 - 12 %   Monocytes Absolute 0.5  0.1 - 1.0 K/uL   Eosinophils Relative 2  0 - 5 %   Eosinophils Absolute 0.1  0.0 - 0.7 K/uL   Basophils Relative 1  0 - 1 %   Basophils Absolute 0.0  0.0 - 0.1 K/uL  BASIC METABOLIC PANEL      Result Value Range   Sodium 139  135 - 145 mEq/L   Potassium 3.8  3.5 - 5.1 mEq/L   Chloride 105  96 - 112 mEq/L   CO2 28  19 - 32 mEq/L   Glucose, Bld 91  70 - 99 mg/dL   BUN 15  6 - 23 mg/dL   Creatinine, Ser 0.34  0.50 - 1.35 mg/dL   Calcium 9.3  8.4 - 74.2 mg/dL   GFR calc non Af Amer 72 (*) >90 mL/min   GFR calc Af Amer 83 (*) >90 mL/min  PRO B NATRIURETIC PEPTIDE      Result Value Range   Pro B Natriuretic peptide (BNP) 110.9  0 - 125 pg/mL  URINE RAPID DRUG SCREEN (HOSP PERFORMED)      Result Value Range   Opiates NONE DETECTED  NONE DETECTED   Cocaine NONE DETECTED   NONE DETECTED   Benzodiazepines NONE DETECTED  NONE DETECTED   Amphetamines NONE DETECTED  NONE DETECTED   Tetrahydrocannabinol NONE DETECTED  NONE DETECTED   Barbiturates NONE DETECTED  NONE DETECTED  URINALYSIS, ROUTINE W REFLEX MICROSCOPIC      Result Value Range   Color, Urine YELLOW  YELLOW   APPearance HAZY (*) CLEAR   Specific Gravity, Urine 1.015  1.005 - 1.030   pH 8.0  5.0 - 8.0   Glucose, UA NEGATIVE  NEGATIVE mg/dL   Hgb urine dipstick NEGATIVE  NEGATIVE   Bilirubin Urine NEGATIVE  NEGATIVE   Ketones, ur NEGATIVE  NEGATIVE mg/dL   Protein, ur NEGATIVE  NEGATIVE mg/dL   Urobilinogen, UA 2.0 (*) 0.0 - 1.0 mg/dL   Nitrite NEGATIVE  NEGATIVE   Leukocytes, UA NEGATIVE  NEGATIVE   Dg Chest 2 View  09/11/2012   *RADIOLOGY REPORT*  Clinical Data: 37 year old male headache cough low grade fever hypertension  CHEST - 2 VIEW  Comparison: 01/15/2012 and earlier.  Findings: Lung volumes are stable and within normal limits.  Stable cardiac size at the upper limits of normal. Other mediastinal contours are within normal limits.  Scoliosis.  Lung parenchyma stable and clear.  No pneumothorax or effusion.  IMPRESSION: No acute  cardiopulmonary abnormality.   Original Report Authenticated By: Erskine Speed, M.D.   Dg Lumbar Spine Complete  09/07/2012  .  IMPRESSION: Stable scoliosis and paraspinal osteophytes.  No acute osseous findings demonstrated.   Original Report Authenticated By: Carey Bullocks, M.D.      1. URI (upper respiratory infection)     Plan discharge  Devoria Albe, MD, FACEP    MDM   I personally performed the services described in this documentation, which was scribed in my presence. The recorded information has been reviewed and considered.  Devoria Albe, MD, Armando Gang       Ward Givens, MD 09/11/12 [phone]

## 2012-09-19 ENCOUNTER — Encounter (HOSPITAL_COMMUNITY): Payer: Self-pay | Admitting: *Deleted

## 2012-09-19 ENCOUNTER — Emergency Department (HOSPITAL_COMMUNITY)
Admission: EM | Admit: 2012-09-19 | Discharge: 2012-09-19 | Disposition: A | Discharge status: Home / Self Care | Payer: Self-pay | Attending: Emergency Medicine | Admitting: Emergency Medicine

## 2012-09-19 DIAGNOSIS — L299 Pruritus, unspecified: Secondary | ICD-10-CM | POA: Insufficient documentation

## 2012-09-19 DIAGNOSIS — Z87891 Personal history of nicotine dependence: Secondary | ICD-10-CM | POA: Insufficient documentation

## 2012-09-19 DIAGNOSIS — M545 Low back pain, unspecified: Secondary | ICD-10-CM | POA: Insufficient documentation

## 2012-09-19 DIAGNOSIS — R21 Rash and other nonspecific skin eruption: Secondary | ICD-10-CM | POA: Insufficient documentation

## 2012-09-19 DIAGNOSIS — J45909 Unspecified asthma, uncomplicated: Secondary | ICD-10-CM | POA: Insufficient documentation

## 2012-09-19 DIAGNOSIS — G40909 Epilepsy, unspecified, not intractable, without status epilepticus: Secondary | ICD-10-CM | POA: Insufficient documentation

## 2012-09-19 DIAGNOSIS — G8929 Other chronic pain: Secondary | ICD-10-CM | POA: Insufficient documentation

## 2012-09-19 DIAGNOSIS — R52 Pain, unspecified: Secondary | ICD-10-CM | POA: Insufficient documentation

## 2012-09-19 DIAGNOSIS — F329 Major depressive disorder, single episode, unspecified: Secondary | ICD-10-CM | POA: Insufficient documentation

## 2012-09-19 DIAGNOSIS — F411 Generalized anxiety disorder: Secondary | ICD-10-CM | POA: Insufficient documentation

## 2012-09-19 DIAGNOSIS — A630 Anogenital (venereal) warts: Secondary | ICD-10-CM | POA: Insufficient documentation

## 2012-09-19 DIAGNOSIS — I10 Essential (primary) hypertension: Secondary | ICD-10-CM | POA: Insufficient documentation

## 2012-09-19 DIAGNOSIS — Z79899 Other long term (current) drug therapy: Secondary | ICD-10-CM | POA: Insufficient documentation

## 2012-09-19 DIAGNOSIS — F3289 Other specified depressive episodes: Secondary | ICD-10-CM | POA: Insufficient documentation

## 2012-09-19 MED ORDER — CYCLOBENZAPRINE HCL 10 MG PO TABS: 10.0000 mg | ORAL_TABLET | Freq: Three times a day (TID) | ORAL | Status: DC | PRN

## 2012-09-19 MED ORDER — TRAMADOL HCL 50 MG PO TABS: 50.0000 mg | ORAL_TABLET | Freq: Four times a day (QID) | ORAL | Status: DC | PRN

## 2012-09-19 NOTE — ED Notes (Signed)
C/o chronic lower back pain that started this morning.  Denies new injury.  Also c/o rash to pelvis.

## 2012-09-19 NOTE — ED Notes (Signed)
T Triplett PA in to see pt.

## 2012-09-20 LAB — GC/CHLAMYDIA PROBE AMP
CT Probe RNA: NEGATIVE
GC Probe RNA: NEGATIVE

## 2012-09-21 NOTE — ED Provider Notes (Signed)
History     CSN: [id number]  Arrival date & time 09/19/12  1149   First MD Initiated Contact with Patient 09/19/12 1208      Chief Complaint  Patient presents with  . Back Pain  . Rash    (Consider location/radiation/quality/duration/timing/severity/associated sxs/prior treatment) HPI Comments: Bobby Bridges is a 37 y.o. male who presents to the Emergency Department complaining of acute on chronic low back pain and rash to his genitals.  States the back pain is similar to previous and worse with movement, improves somewhat with rest.  States that he has appt with his PMD next week.  He also reports having a rash with "little bumps" to the penis.  He states the rash itches slightly, but denies drainage, swelling, redness , blisters or dysuria.    Patient is a 37 y.o. male presenting with back pain. The history is provided by the patient.  Back Pain Location:  Lumbar spine Quality:  Aching Radiates to:  Does not radiate Pain severity:  Moderate Pain is:  Same all the time Onset quality:  Gradual Duration: chronic low back pain that became worse on the day of ED arrival. Progression:  Worsening Chronicity:  Chronic Context: not lifting heavy objects, not recent injury and not twisting   Relieved by:  Nothing Worsened by:  Ambulation, bending, twisting and standing Ineffective treatments:  None tried Associated symptoms: no abdominal pain, no abdominal swelling, no bladder incontinence, no bowel incontinence, no chest pain, no dysuria, no fever, no headaches, no leg pain, no numbness, no paresthesias, no pelvic pain, no perianal numbness, no tingling and no weakness     Past Medical History  Diagnosis Date  . Hypertension   . Anxiety   . Depression   . Asthma   . Seizures     outgrew by age 80  . Chronic back pain     History reviewed. No pertinent past surgical history.  No family history on file.  History  Substance Use Topics  . Smoking status: Former Smoker  -- 1.00 packs/day for 2 years    Types: Cigarettes    Quit date: 04/08/2012  . Smokeless tobacco: Never Used  . Alcohol Use: No      Review of Systems  Constitutional: Negative for fever, chills, activity change and appetite change.  HENT: Negative for sore throat, facial swelling, trouble swallowing, neck pain and neck stiffness.   Respiratory: Negative for chest tightness, shortness of breath and wheezing.   Cardiovascular: Negative for chest pain.  Gastrointestinal: Negative for vomiting, abdominal pain, constipation and bowel incontinence.  Genitourinary: Negative for bladder incontinence, dysuria, urgency, frequency, hematuria, flank pain, decreased urine volume, discharge, penile swelling, scrotal swelling, difficulty urinating, penile pain and pelvic pain.       No perineal numbness or incontinence of urine or feces.  Rash to the penis  Musculoskeletal: Positive for back pain. Negative for joint swelling.  Skin: Negative for rash and wound.  Neurological: Negative for dizziness, tingling, weakness, numbness, headaches and paresthesias.  All other systems reviewed and are negative.    Allergies  Review of patient's allergies indicates no known allergies.  Home Medications   Current Outpatient Rx  Name  Route  Sig  Dispense  Refill  . baclofen (LIORESAL) 10 MG tablet   Oral   Take 1 tablet (10 mg total) by mouth 3 (three) times daily.   21 each   0   . citalopram (CELEXA) 20 MG tablet   Oral  Take 1 tablet (20 mg total) by mouth daily.   30 tablet   0   . cyclobenzaprine (FLEXERIL) 10 MG tablet   Oral   Take 1 tablet (10 mg total) by mouth 3 (three) times daily as needed for muscle spasms.   30 tablet   0   . cyclobenzaprine (FLEXERIL) 10 MG tablet   Oral   Take 1 tablet (10 mg total) by mouth 3 (three) times daily as needed for muscle spasms.   21 tablet   0   . furosemide (LASIX) 40 MG tablet   Oral   Take 1 tablet (40 mg total) by mouth daily.   30  tablet   0   . loratadine (CLARITIN) 10 MG tablet   Oral   Take 1 tablet (10 mg total) by mouth daily.   15 tablet   0   . naproxen (NAPROSYN) 500 MG tablet   Oral   Take 1 tablet (500 mg total) by mouth 2 (two) times daily.   30 tablet   0   . oxyCODONE-acetaminophen (PERCOCET) 7.5-325 MG per tablet   Oral   Take 1 tablet by mouth every 4 (four) hours as needed for pain.   30 tablet   0   . traMADol (ULTRAM) 50 MG tablet   Oral   Take 1 tablet (50 mg total) by mouth every 6 (six) hours as needed for pain.   15 tablet   0     BP 118/72  Pulse 85  Temp(Src) 98.8 F (37.1 C) (Oral)  Resp 16  Ht 6' (1.829 m)  Wt 301 lb 6.4 oz (136.714 kg)  BMI 40.87 kg/m2  SpO2 99%  Physical Exam  Nursing note and vitals reviewed. Constitutional: He is oriented to person, place, and time. He appears well-developed and well-nourished. No distress.  HENT:  Head: Normocephalic and atraumatic.  Neck: Normal range of motion. Neck supple.  Cardiovascular: Normal rate, regular rhythm, normal heart sounds and intact distal pulses.   No murmur heard. Pulmonary/Chest: Effort normal and breath sounds normal. No respiratory distress.  Abdominal: Soft. He exhibits no distension. There is no tenderness. There is no rebound and no guarding.  Genitourinary: Testes normal. Cremasteric reflex is present. Right testis shows no mass, no swelling and no tenderness. Left testis shows no mass, no swelling and no tenderness. Circumcised. No phimosis, paraphimosis, penile erythema or penile tenderness. No discharge found.  verrucous lesions to the distal shaft of the penis.  no erythema, vesicles, or edema.    Musculoskeletal: He exhibits tenderness. He exhibits no edema.       Lumbar back: He exhibits tenderness and pain. He exhibits normal range of motion, no swelling, no deformity, no laceration and normal pulse.       Back:  ttp of the lumbar paraspinal muscles.  DP pulses are brisk and symmetrical.   Distal sensation intact.  Hip Flexors/Extensors are intact  Neurological: He is alert and oriented to person, place, and time. No cranial nerve deficit or sensory deficit. He exhibits normal muscle tone. Coordination and gait normal.  Reflex Scores:      Patellar reflexes are 2+ on the right side and 2+ on the left side.      Achilles reflexes are 2+ on the right side and 2+ on the left side. Skin: Skin is warm and dry.    ED Course  Procedures (including critical care time)  Results for orders placed during the hospital encounter of 09/19/12  GC/CHLAMYDIA PROBE AMP      Result Value Range   CT Probe RNA NEGATIVE  NEGATIVE   GC Probe RNA NEGATIVE  NEGATIVE      1. Genital warts   2. Low back pain       MDM    Previous ED charts reviewed  Patient has multiple ED visits for same and chronic low back pain.  Pain on this visit is same as previous.  Patient has ttp of the lumbar paraspinal muscles.  No focal neuro deficits on exam.  Ambulates with a steady gait.     Patient advised that he needs to f/u with PMD or pain management, states that he has appt with Dr. Janna Arch soon.    Patient has multiple verrucous lesions to the shaft of the penis.  No penile discharge, erythema or swelling.  No testicular lesions or tenderness.  Advised to use use condoms or abstain for intercourse, given referral for urology or health dept for treatment.       Rewa Weissberg L. Trisha Mangle, PA-C 09/21/12 1426

## 2012-09-22 ENCOUNTER — Encounter (HOSPITAL_COMMUNITY): Payer: Self-pay | Admitting: *Deleted

## 2012-09-22 ENCOUNTER — Emergency Department (HOSPITAL_COMMUNITY)
Admission: EM | Admit: 2012-09-22 | Discharge: 2012-09-22 | Disposition: A | Discharge status: Home / Self Care | Payer: Self-pay | Attending: Emergency Medicine | Admitting: Emergency Medicine

## 2012-09-22 DIAGNOSIS — Z8669 Personal history of other diseases of the nervous system and sense organs: Secondary | ICD-10-CM | POA: Insufficient documentation

## 2012-09-22 DIAGNOSIS — G8929 Other chronic pain: Secondary | ICD-10-CM | POA: Insufficient documentation

## 2012-09-22 DIAGNOSIS — F411 Generalized anxiety disorder: Secondary | ICD-10-CM | POA: Insufficient documentation

## 2012-09-22 DIAGNOSIS — Z79899 Other long term (current) drug therapy: Secondary | ICD-10-CM | POA: Insufficient documentation

## 2012-09-22 DIAGNOSIS — M545 Low back pain, unspecified: Secondary | ICD-10-CM | POA: Insufficient documentation

## 2012-09-22 DIAGNOSIS — I10 Essential (primary) hypertension: Secondary | ICD-10-CM | POA: Insufficient documentation

## 2012-09-22 DIAGNOSIS — F3289 Other specified depressive episodes: Secondary | ICD-10-CM | POA: Insufficient documentation

## 2012-09-22 DIAGNOSIS — Z87891 Personal history of nicotine dependence: Secondary | ICD-10-CM | POA: Insufficient documentation

## 2012-09-22 DIAGNOSIS — J45909 Unspecified asthma, uncomplicated: Secondary | ICD-10-CM | POA: Insufficient documentation

## 2012-09-22 DIAGNOSIS — F329 Major depressive disorder, single episode, unspecified: Secondary | ICD-10-CM | POA: Insufficient documentation

## 2012-09-22 DIAGNOSIS — M79609 Pain in unspecified limb: Secondary | ICD-10-CM | POA: Insufficient documentation

## 2012-09-22 MED ORDER — HYDROCODONE-ACETAMINOPHEN 5-325 MG PO TABS: 1.0000 | ORAL_TABLET | ORAL | Status: DC | PRN

## 2012-09-22 MED ORDER — CYCLOBENZAPRINE HCL 10 MG PO TABS: 10.0000 mg | ORAL_TABLET | Freq: Two times a day (BID) | ORAL | Status: DC | PRN

## 2012-09-22 NOTE — ED Provider Notes (Signed)
History     CSN: [id number]  Arrival date & time 09/22/12  1407   First MD Initiated Contact with Patient 09/22/12 1542      Chief Complaint  Patient presents with  . Back Pain    (Consider location/radiation/quality/duration/timing/severity/associated sxs/prior treatment) Patient is a 37 y.o. male presenting with back pain. The history is provided by the patient.  Back Pain Location:  Lumbar spine Radiates to:  R posterior upper leg and L posterior upper leg Pain severity:  Severe Pain is:  Worse during the night Onset quality:  Gradual Duration:  4 months Timing:  Constant Progression:  Worsening Relieved by:  Nothing Worsened by:  Movement and lying down Associated symptoms: leg pain   Associated symptoms: no abdominal pain, no bladder incontinence, no bowel incontinence, no chest pain, no dysuria, no fever and no headaches   Risk factors: lack of exercise and obesity    Bobby Bridges is a 37 y.o. male who presents to the ED with low back pain. He has been having problems for the past 4 months. He has taken ibuprofen and Tramadol without relief. He has an appointment to start care with Dr. Janna Arch July 2.  Past Medical History  Diagnosis Date  . Hypertension   . Anxiety   . Depression   . Asthma   . Seizures     outgrew by age 34  . Chronic back pain     History reviewed. No pertinent past surgical history.  No family history on file.  History  Substance Use Topics  . Smoking status: Former Smoker -- 1.00 packs/day for 2 years    Types: Cigarettes    Quit date: 04/08/2012  . Smokeless tobacco: Never Used  . Alcohol Use: No      Review of Systems  Constitutional: Negative for fever and chills.  HENT: Negative for neck pain.   Respiratory: Negative for cough.   Cardiovascular: Negative for chest pain.  Gastrointestinal: Negative for nausea, vomiting, abdominal pain and bowel incontinence.  Genitourinary: Negative for bladder incontinence and  dysuria.  Musculoskeletal: Positive for back pain.  Skin: Negative for rash.  Neurological: Negative for syncope and headaches.  Psychiatric/Behavioral: Nervous/anxious: hx of depression on medication.     Allergies  Review of patient's allergies indicates no known allergies.  Home Medications   Current Outpatient Rx  Name  Route  Sig  Dispense  Refill  . citalopram (CELEXA) 20 MG tablet   Oral   Take 1 tablet (20 mg total) by mouth daily.   30 tablet   0   . cyclobenzaprine (FLEXERIL) 10 MG tablet   Oral   Take 1 tablet (10 mg total) by mouth 3 (three) times daily as needed for muscle spasms.   21 tablet   0   . furosemide (LASIX) 40 MG tablet   Oral   Take 1 tablet (40 mg total) by mouth daily.   30 tablet   0   . ibuprofen (ADVIL,MOTRIN) 200 MG tablet   Oral   Take 200 mg by mouth every 6 (six) hours as needed for pain.         Marland Kitchen loratadine (CLARITIN) 10 MG tablet   Oral   Take 1 tablet (10 mg total) by mouth daily.   15 tablet   0   . traMADol (ULTRAM) 50 MG tablet   Oral   Take 1 tablet (50 mg total) by mouth every 6 (six) hours as needed for pain.   15  tablet   0     BP 115/67  Pulse 75  Temp(Src) 98 F (36.7 C) (Oral)  Resp 22  Ht 6' (1.829 m)  Wt 300 lb (136.079 kg)  BMI 40.68 kg/m2  SpO2 100%  Physical Exam  Nursing note and vitals reviewed. Constitutional: He is oriented to person, place, and time. No distress.  Morbidly obese A/A  HENT:  Head: Normocephalic.  Neck: Normal range of motion. Neck supple.  Cardiovascular: Normal rate, regular rhythm and normal heart sounds.   Pulmonary/Chest: Effort normal and breath sounds normal. No respiratory distress.  Abdominal: Soft. Bowel sounds are normal. He exhibits no pulsatile midline mass. There is no tenderness. There is no CVA tenderness.  Musculoskeletal:       Lumbar back: He exhibits decreased range of motion, tenderness and spasm.       Back:  Increased pain with range of motion.  Pedal pulses strong and equal bilateral. Adequate circulation, good touch sensation. Steady gait. Straight leg raises without difficulty. Full flexion and extension of knees without difficulty.   Neurological: He is alert and oriented to person, place, and time. He has normal strength and normal reflexes. No cranial nerve deficit or sensory deficit. Gait normal.  Skin: Skin is warm and dry.  Psychiatric: He has a normal mood and affect. His behavior is normal.    ED Course  Procedures (including critical care time)   MDM  37 y.o. male who is morbidly obese with chronic low back pain. Discussed with patient importance of follow up as scheduled. Will treat pain and muscle spasm.  Discussed with the patient clinical findings and plan of care and all questioned fully answered. He will return if any problems arise.    Medication List    TAKE these medications       cyclobenzaprine 10 MG tablet  Commonly known as:  FLEXERIL  Take 1 tablet (10 mg total) by mouth 2 (two) times daily as needed for muscle spasms.     HYDROcodone-acetaminophen 5-325 MG per tablet  Commonly known as:  NORCO/VICODIN  Take 1 tablet by mouth every 4 (four) hours as needed.      ASK your doctor about these medications       citalopram 20 MG tablet  Commonly known as:  CELEXA  Take 1 tablet (20 mg total) by mouth daily.     cyclobenzaprine 10 MG tablet  Commonly known as:  FLEXERIL  Take 1 tablet (10 mg total) by mouth 3 (three) times daily as needed for muscle spasms.     furosemide 40 MG tablet  Commonly known as:  LASIX  Take 1 tablet (40 mg total) by mouth daily.     ibuprofen 200 MG tablet  Commonly known as:  ADVIL,MOTRIN  Take 200 mg by mouth every 6 (six) hours as needed for pain.     loratadine 10 MG tablet  Commonly known as:  CLARITIN  Take 1 tablet (10 mg total) by mouth daily.     traMADol 50 MG tablet  Commonly known as:  ULTRAM  Take 1 tablet (50 mg total) by mouth every 6 (six) hours  as needed for pain.               [facility] Orlene Och, NP 09/22/12 1600

## 2012-09-22 NOTE — ED Notes (Signed)
Chronic lower back pain. Seeing PMD on July 2. Currently taking Tramadol and Ibuprofen with no relief.

## 2012-09-23 NOTE — ED Provider Notes (Signed)
Medical screening examination/treatment/procedure(s) were performed by non-physician practitioner and as supervising physician I was immediately available for consultation/collaboration.    Jalene Demo D Quentin Strebel, MD 09/23/12 1048 

## 2012-09-28 NOTE — ED Provider Notes (Signed)
Medical screening examination/treatment/procedure(s) were performed by non-physician practitioner and as supervising physician I was immediately available for consultation/collaboration.   Cynthia Cogle L Thorn Demas, MD 09/28/12 0255 

## 2012-10-07 ENCOUNTER — Encounter (HOSPITAL_COMMUNITY): Payer: Self-pay | Admitting: Emergency Medicine

## 2012-10-07 ENCOUNTER — Emergency Department (HOSPITAL_COMMUNITY)
Admission: EM | Admit: 2012-10-07 | Discharge: 2012-10-07 | Disposition: A | Discharge status: Home / Self Care | Payer: Self-pay | Attending: Emergency Medicine | Admitting: Emergency Medicine

## 2012-10-07 DIAGNOSIS — G40909 Epilepsy, unspecified, not intractable, without status epilepticus: Secondary | ICD-10-CM | POA: Insufficient documentation

## 2012-10-07 DIAGNOSIS — Z87891 Personal history of nicotine dependence: Secondary | ICD-10-CM | POA: Insufficient documentation

## 2012-10-07 DIAGNOSIS — M549 Dorsalgia, unspecified: Secondary | ICD-10-CM | POA: Insufficient documentation

## 2012-10-07 DIAGNOSIS — F329 Major depressive disorder, single episode, unspecified: Secondary | ICD-10-CM | POA: Insufficient documentation

## 2012-10-07 DIAGNOSIS — J45909 Unspecified asthma, uncomplicated: Secondary | ICD-10-CM | POA: Insufficient documentation

## 2012-10-07 DIAGNOSIS — Z79899 Other long term (current) drug therapy: Secondary | ICD-10-CM | POA: Insufficient documentation

## 2012-10-07 DIAGNOSIS — G8929 Other chronic pain: Secondary | ICD-10-CM | POA: Insufficient documentation

## 2012-10-07 DIAGNOSIS — F411 Generalized anxiety disorder: Secondary | ICD-10-CM | POA: Insufficient documentation

## 2012-10-07 DIAGNOSIS — F3289 Other specified depressive episodes: Secondary | ICD-10-CM | POA: Insufficient documentation

## 2012-10-07 DIAGNOSIS — F419 Anxiety disorder, unspecified: Secondary | ICD-10-CM

## 2012-10-07 DIAGNOSIS — I10 Essential (primary) hypertension: Secondary | ICD-10-CM | POA: Insufficient documentation

## 2012-10-07 LAB — BASIC METABOLIC PANEL
Chloride: 101 mEq/L (ref 96–112)
Creatinine, Ser: 1.03 mg/dL (ref 0.50–1.35)
GFR calc Af Amer: >90 mL/min (ref >90)
GFR calc non Af Amer: >90 mL/min (ref >90)
Potassium: 3.4 mEq/L — ABNORMAL LOW (ref 3.5–5.1)

## 2012-10-07 LAB — CBC WITH DIFFERENTIAL/PLATELET
Basophils Absolute: 0 10*3/uL (ref 0.0–0.1)
Basophils Relative: 0 % (ref 0–1)
MCHC: 33.6 g/dL (ref 30.0–36.0)
Neutro Abs: 3 10*3/uL (ref 1.7–7.7)
Neutrophils Relative %: 48 % (ref 43–77)
RDW: 13 % (ref 11.5–15.5)

## 2012-10-07 LAB — RAPID URINE DRUG SCREEN, HOSP PERFORMED
Benzodiazepines: NOT DETECTED
Cocaine: NOT DETECTED

## 2012-10-07 LAB — ETHANOL: Alcohol, Ethyl (B): <11 mg/dL (ref 0–11)

## 2012-10-07 MED ORDER — IBUPROFEN 800 MG PO TABS
800.0000 mg | ORAL_TABLET | Freq: Once | ORAL | Status: AC
  Administered 2012-10-07: 800 mg via ORAL
  Filled 2012-10-07: qty 1
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
## 2012-11-27 ENCOUNTER — Emergency Department (HOSPITAL_COMMUNITY)
Admission: EM | Admit: 2012-11-27 | Discharge: 2012-11-27 | Disposition: A | Discharge status: Home / Self Care | Payer: Self-pay | Attending: Emergency Medicine | Admitting: Emergency Medicine

## 2012-11-27 ENCOUNTER — Encounter (HOSPITAL_COMMUNITY): Payer: Self-pay | Admitting: Emergency Medicine

## 2012-11-27 DIAGNOSIS — H5789 Other specified disorders of eye and adnexa: Secondary | ICD-10-CM | POA: Insufficient documentation

## 2012-11-27 DIAGNOSIS — Z87891 Personal history of nicotine dependence: Secondary | ICD-10-CM | POA: Insufficient documentation

## 2012-11-27 DIAGNOSIS — G8929 Other chronic pain: Secondary | ICD-10-CM | POA: Insufficient documentation

## 2012-11-27 DIAGNOSIS — H538 Other visual disturbances: Secondary | ICD-10-CM | POA: Insufficient documentation

## 2012-11-27 DIAGNOSIS — Z79899 Other long term (current) drug therapy: Secondary | ICD-10-CM | POA: Insufficient documentation

## 2012-11-27 DIAGNOSIS — I10 Essential (primary) hypertension: Secondary | ICD-10-CM | POA: Insufficient documentation

## 2012-11-27 DIAGNOSIS — H109 Unspecified conjunctivitis: Secondary | ICD-10-CM | POA: Insufficient documentation

## 2012-11-27 DIAGNOSIS — M549 Dorsalgia, unspecified: Secondary | ICD-10-CM | POA: Insufficient documentation

## 2012-11-27 DIAGNOSIS — J45909 Unspecified asthma, uncomplicated: Secondary | ICD-10-CM | POA: Insufficient documentation

## 2012-11-27 DIAGNOSIS — F329 Major depressive disorder, single episode, unspecified: Secondary | ICD-10-CM | POA: Insufficient documentation

## 2012-11-27 DIAGNOSIS — F411 Generalized anxiety disorder: Secondary | ICD-10-CM | POA: Insufficient documentation

## 2012-11-27 DIAGNOSIS — Z8669 Personal history of other diseases of the nervous system and sense organs: Secondary | ICD-10-CM | POA: Insufficient documentation

## 2012-11-27 DIAGNOSIS — F3289 Other specified depressive episodes: Secondary | ICD-10-CM | POA: Insufficient documentation

## 2012-11-27 MED ORDER — HYDROCODONE-ACETAMINOPHEN 5-325 MG PO TABS: 2.0000 | ORAL_TABLET | ORAL | Status: DC | PRN

## 2012-11-27 MED ORDER — FLUORESCEIN SODIUM 1 MG OP STRP
ORAL_STRIP | OPHTHALMIC | Status: AC
  Filled 2012-11-27: qty 1

## 2012-11-27 MED ORDER — GENTAMICIN SULFATE 0.3 % OP SOLN: 1.0000 [drp] | OPHTHALMIC | Status: DC

## 2012-11-27 NOTE — ED Notes (Signed)
C/o right eye pain started today. Sclera red and watery. Denies injury. Slight swelling noted. "little" blurred vision noted. Rates eye pain 10. Pt also c/o of his chronic lower back pain at present. nad denies gu sx's. Nad. Rates back pain 7

## 2012-11-27 NOTE — ED Notes (Signed)
Pt c/o right eye redness and irritation as well as lower back pain. Pt states his right became irritate yesterday afternoon. When pt woke up this morning his eye was "covered in crust". Pt states lower back pain is chronic x1 year. Pt reports worsened of symptoms and states pain radiates down both legs. Pain becomes worse with movement and standing.

## 2012-11-27 NOTE — ED Provider Notes (Signed)
CSN: [id number]     Arrival date & time 11/27/12  1511 History   This chart was scribed for Bobby Kinds, MD by Dorothey Baseman, ED Scribe and Bennett Scrape, ED Scribe. This patient was seen in room APA03/AP[facility]03 and the patient's care was started at 3:33 PM.   First MD Initiated Contact with Patient 11/27/12 1526     Chief Complaint  Patient presents with  . Eye Pain  . Back Pain    The history is provided by the patient. No language interpreter was used.    HPI Comments: Bobby Bridges is a 37 y.o. male who presents to the Emergency Department complaining of sudden onset, constant right eye pain that began this morning. He states that he woke up with the eye crusted over and had to pry the eye open to see out causing immediate pain. He reports that his vision has been mildly blurred in the right eye with a mild amount of yellow drainage since then. He denies any recent injury to the eye or suspect activities. He denies having any symptoms in his left eye. 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 Patient has a history of hypertension.   He also c/o chronic back pain secondary to a prior injury. He denies being on any daily pain medications for the pain and denies any recent injuries.   Past Medical History  Diagnosis Date  . Hypertension   . Anxiety   . Depression   . Asthma   . Seizures     outgrew by age 36  . Chronic back pain    History reviewed. No pertinent past surgical history. History reviewed. No pertinent family history. History  Substance Use Topics  . Smoking status: Former Smoker -- 1.00 packs/day for 2 years    Types: Cigarettes    Quit date: 04/08/2012  . Smokeless tobacco: Never Used  . Alcohol Use: No    Review of Systems  Constitutional: Negative for chills, diaphoresis, appetite change and fatigue.  HENT: Negative for sore throat, mouth sores and trouble swallowing.   Eyes: Positive for pain, discharge,  redness and visual disturbance.  Respiratory: Negative for cough, chest tightness, shortness of breath and wheezing.   Gastrointestinal: Negative for nausea, vomiting, diarrhea and abdominal distention.  Endocrine: Negative for polydipsia, polyphagia and polyuria.  Genitourinary: Negative for frequency and hematuria.  Musculoskeletal: Positive for back pain. Negative for gait problem.  Skin: Negative for color change, pallor and rash.  Neurological: Negative for dizziness, syncope and light-headedness.  Hematological: Does not bruise/bleed easily.  Psychiatric/Behavioral: Negative for behavioral problems and confusion.    Allergies  Review of patient's allergies indicates no known allergies.-confirmed by pt at bedside  Home Medications   Current Outpatient Rx  Name  Route  Sig  Dispense  Refill  . ibuprofen (ADVIL,MOTRIN) 200 MG tablet   Oral   Take 400 mg by mouth every 8 (eight) hours as needed for pain.          Marland Kitchen loratadine (CLARITIN) 10 MG tablet   Oral   Take 1 tablet (10 mg total) by mouth daily.   15 tablet   0   . citalopram (CELEXA) 20 MG tablet   Oral   Take 1 tablet (20 mg total) by mouth daily.   30 tablet   0    Triage Vitals: BP 131/92  Pulse 87  Temp(Src) 98.3 F (36.8 C) (Oral)  Resp 21  SpO2 100%  Physical Exam  Nursing note and vitals reviewed. Constitutional: He is oriented to person, place, and time. He appears well-developed and well-nourished. No distress.  HENT:  Head: Normocephalic and atraumatic.  Eyes: EOM are normal. Pupils are equal, round, and reactive to light. Right eye exhibits discharge. No scleral icterus.  Sclera and lid is diffusley injected in the right eye. Exudate present in the right eye. Clear right anterior chamber. Esotropia to the right eye. Slit lamp exam: No foreign bodies present. Clear right anterior chamber.  Wood's Lamp: No focal uptake of fluorescein. No ulcers.   Neck: Normal range of motion.  Cardiovascular:  Normal rate.   Pulmonary/Chest: Effort normal. No respiratory distress.  Musculoskeletal: Normal range of motion.  Neurological: He is alert and oriented to person, place, and time.  Skin: Skin is warm and dry. No rash noted.  Psychiatric: He has a normal mood and affect. His behavior is normal.    ED Course   DIAGNOSTIC STUDIES: Oxygen Saturation is 10`% on room air, normal by my interpretation.    COORDINATION OF CARE: 3:42 PM-Discussed treatment plan which includes slit lamp and wood's exam with pt at bedside and pt agreed to plan.   3:44 PM- Administered fluorescein to the right eye with no complications after getting pt's permission.   3:45 PM-Discussed no scratch or injury to the eye. Advised pt symptoms are most likely due to "pink eye". Will prescribe antibiotic eye drops. Advised patient to follow up with his PCP if symptoms do not subside. Discussed that patient will not receive pain medication for his chronic back pain. Patient verbalized understanding and agreed to treatment course.   Procedures (including critical care time)   1. Conjunctivitis     MDM  No acute vision loss. His esotropia is  chronic. Clinically conjunctivitis. Followup if not improving with drops.  I personally performed the services described in this documentation, which was scribed in my presence. The recorded information has been reviewed and is accurate.    Bobby Kinds, MD 11/27/12 1630

## 2013-02-02 ENCOUNTER — Emergency Department (HOSPITAL_COMMUNITY)
Admission: EM | Admit: 2013-02-02 | Discharge: 2013-02-02 | Disposition: A | Discharge status: Home / Self Care | Payer: Self-pay | Attending: Emergency Medicine | Admitting: Emergency Medicine

## 2013-02-02 ENCOUNTER — Emergency Department (HOSPITAL_COMMUNITY): Payer: Self-pay

## 2013-02-02 ENCOUNTER — Encounter (HOSPITAL_COMMUNITY): Payer: Self-pay | Admitting: Emergency Medicine

## 2013-02-02 DIAGNOSIS — F411 Generalized anxiety disorder: Secondary | ICD-10-CM | POA: Insufficient documentation

## 2013-02-02 DIAGNOSIS — I10 Essential (primary) hypertension: Secondary | ICD-10-CM | POA: Insufficient documentation

## 2013-02-02 DIAGNOSIS — A630 Anogenital (venereal) warts: Secondary | ICD-10-CM | POA: Insufficient documentation

## 2013-02-02 DIAGNOSIS — J45909 Unspecified asthma, uncomplicated: Secondary | ICD-10-CM | POA: Insufficient documentation

## 2013-02-02 DIAGNOSIS — F329 Major depressive disorder, single episode, unspecified: Secondary | ICD-10-CM | POA: Insufficient documentation

## 2013-02-02 DIAGNOSIS — Z79899 Other long term (current) drug therapy: Secondary | ICD-10-CM | POA: Insufficient documentation

## 2013-02-02 DIAGNOSIS — M545 Low back pain, unspecified: Secondary | ICD-10-CM | POA: Insufficient documentation

## 2013-02-02 DIAGNOSIS — M79609 Pain in unspecified limb: Secondary | ICD-10-CM | POA: Insufficient documentation

## 2013-02-02 DIAGNOSIS — G8929 Other chronic pain: Secondary | ICD-10-CM | POA: Insufficient documentation

## 2013-02-02 DIAGNOSIS — Z87891 Personal history of nicotine dependence: Secondary | ICD-10-CM | POA: Insufficient documentation

## 2013-02-02 DIAGNOSIS — IMO0002 Reserved for concepts with insufficient information to code with codable children: Secondary | ICD-10-CM | POA: Insufficient documentation

## 2013-02-02 DIAGNOSIS — F3289 Other specified depressive episodes: Secondary | ICD-10-CM | POA: Insufficient documentation

## 2013-02-02 LAB — BASIC METABOLIC PANEL
BUN: 19 mg/dL (ref 6–23)
CO2: 26 mEq/L (ref 19–32)
Calcium: 9.1 mg/dL (ref 8.4–10.5)
Chloride: 104 mEq/L (ref 96–112)
Creatinine, Ser: 1.12 mg/dL (ref 0.50–1.35)
Glucose, Bld: 93 mg/dL (ref 70–99)

## 2013-02-02 LAB — CBC WITH DIFFERENTIAL/PLATELET
Basophils Absolute: 0 10*3/uL (ref 0.0–0.1)
Eosinophils Relative: 2 % (ref 0–5)
HCT: 45.3 % (ref 39.0–52.0)
Hemoglobin: 15 g/dL (ref 13.0–17.0)
Lymphocytes Relative: 40 % (ref 12–46)
MCV: 84.5 fL (ref 78.0–100.0)
Monocytes Absolute: 0.3 10*3/uL (ref 0.1–1.0)
Monocytes Relative: 8 % (ref 3–12)
RDW: 13.2 % (ref 11.5–15.5)
WBC: 3.8 10*3/uL — ABNORMAL LOW (ref 4.0–10.5)

## 2013-02-02 MED ORDER — HYDROCORTISONE 1 % EX CREA: TOPICAL_CREAM | CUTANEOUS | Status: DC

## 2013-02-02 MED ORDER — TRAMADOL HCL 50 MG PO TABS: 50.0000 mg | ORAL_TABLET | Freq: Three times a day (TID) | ORAL | Status: DC | PRN

## 2013-02-02 NOTE — Progress Notes (Signed)
ED/CM noted patient did not have health insurance and/or PCP listed in the computer.  Patient was given the [place] resource handout with information on the clinics, food pantries, and the handout for new health insurance sign-up.  Patient expressed appreciation for this. 

## 2013-02-02 NOTE — ED Notes (Signed)
Pt alert & oriented x4, stable gait. Patient given discharge instructions, paperwork & prescription(s). Patient  instructed to stop at the registration desk to finish any additional paperwork. Patient verbalized understanding. Pt left department w/ no further questions. 

## 2013-02-02 NOTE — ED Notes (Signed)
Pt reports "bumps to my penis that fall off" and after intercourse with my wife it's not normal white its all bubbly".  Also having bil leg pain, that he thinks may be from his back, that he is trying to get disability for.  Denies any painful urination, no fever, no body aches. Denies any green/yellow discharge.

## 2013-02-02 NOTE — ED Provider Notes (Signed)
CSN: [id number]     Arrival date & time 02/02/13  0945 History   First MD Initiated Contact with Patient 02/02/13 1015     Chief Complaint  Patient presents with  . Rash  . Leg Pain   (Consider location/radiation/quality/duration/timing/severity/associated sxs/prior Treatment) Patient is a 37 y.o. male presenting with rash and leg pain. The history is provided by the patient (the pt complains of a lesion on his penis and back pain).  Rash Location: only on penis. Quality: not blistering   Severity:  Mild Onset quality:  Gradual Timing:  Constant Chronicity:  Chronic Context: not animal contact   Associated symptoms: no abdominal pain, no diarrhea, no fatigue and no headaches   Leg Pain Associated symptoms: no back pain and no fatigue     Past Medical History  Diagnosis Date  . Hypertension   . Anxiety   . Depression   . Asthma   . Seizures     outgrew by age 83  . Chronic back pain    History reviewed. No pertinent past surgical history. No family history on file. History  Substance Use Topics  . Smoking status: Former Smoker -- 1.00 packs/day for 2 years    Types: Cigarettes    Quit date: 04/08/2012  . Smokeless tobacco: Never Used  . Alcohol Use: No    Review of Systems  Constitutional: Negative for appetite change and fatigue.  HENT: Negative for congestion, ear discharge and sinus pressure.   Eyes: Negative for discharge.  Respiratory: Negative for cough.   Cardiovascular: Negative for chest pain.  Gastrointestinal: Negative for abdominal pain and diarrhea.  Genitourinary: Negative for frequency and hematuria.       Rash on penis  Musculoskeletal: Negative for back pain.  Skin: Positive for rash.  Neurological: Negative for seizures and headaches.  Psychiatric/Behavioral: Negative for hallucinations.    Allergies  Review of patient's allergies indicates no known allergies.  Home Medications   Current Outpatient Rx  Name  Route  Sig  Dispense   Refill  . citalopram (CELEXA) 20 MG tablet   Oral   Take 1 tablet (20 mg total) by mouth daily.   30 tablet   0   . gentamicin (GARAMYCIN) 0.3 % ophthalmic solution   Right Eye   Place 1 drop into the right eye every 4 (four) hours.   5 mL   0   . ibuprofen (ADVIL,MOTRIN) 200 MG tablet   Oral   Take 400 mg by mouth every 8 (eight) hours as needed for pain.          Marland Kitchen loratadine (CLARITIN) 10 MG tablet   Oral   Take 1 tablet (10 mg total) by mouth daily.   15 tablet   0   . hydrocortisone cream 1 %      Apply to affected area 2 times daily for itching   15 g   0   . traMADol (ULTRAM) 50 MG tablet   Oral   Take 1 tablet (50 mg total) by mouth every 8 (eight) hours as needed for pain.   20 tablet   0    BP 120/76  Pulse 62  Temp(Src) 98.3 F (36.8 C) (Oral)  Resp 20  Ht 6' (1.829 m)  Wt 298 lb 7 oz (135.37 kg)  BMI 40.47 kg/m2  SpO2 98% Physical Exam  Constitutional: He is oriented to person, place, and time. He appears well-developed.  HENT:  Head: Normocephalic.  Eyes: Conjunctivae and EOM are  normal. No scleral icterus.  Neck: Neck supple. No thyromegaly present.  Cardiovascular: Normal rate and regular rhythm.  Exam reveals no gallop and no friction rub.   No murmur heard. Pulmonary/Chest: No stridor. He has no wheezes. He has no rales. He exhibits no tenderness.  Abdominal: He exhibits no distension. There is no tenderness. There is no rebound.  Genitourinary:  Genital warts on penis shaft  Musculoskeletal: Normal range of motion. He exhibits no edema.  Tender lumbar spine  Lymphadenopathy:    He has no cervical adenopathy.  Neurological: He is oriented to person, place, and time. He exhibits normal muscle tone. Coordination normal.  Skin: No rash noted. No erythema.  Psychiatric: He has a normal mood and affect. His behavior is normal.    ED Course  Procedures (including critical care time) Labs Review Labs Reviewed  CBC WITH DIFFERENTIAL -  Abnormal; Notable for the following:    WBC 3.8 (*)    All other components within normal limits  BASIC METABOLIC PANEL - Abnormal; Notable for the following:    GFR calc non Af Amer 83 (*)    All other components within normal limits   Imaging Review Dg Lumbar Spine Complete  02/02/2013   CLINICAL DATA:  Back and bilateral leg pain.  EXAM: LUMBAR SPINE - COMPLETE 4+ VIEW  COMPARISON:  09/07/2012.  FINDINGS: Stable left convex lumbar scoliosis. Stable alignment of the lumbar vertebral bodies on the lateral film. No acute bony findings or destructive bony changes. The visualized bony pelvis is intact.  IMPRESSION: Stable scoliosis and degenerative changes. No acute bony findings.   Electronically Signed   By: Loralie Champagne M.D.   On: 02/02/2013 11:54    EKG Interpretation   None       MDM   1. Genital warts        Benny Lennert, MD 02/02/13 [phone]

## 2013-05-13 ENCOUNTER — Emergency Department (HOSPITAL_COMMUNITY)
Admission: EM | Admit: 2013-05-13 | Discharge: 2013-05-14 | Disposition: A | Discharge status: Home / Self Care | Payer: BC Managed Care – PPO | Attending: Emergency Medicine | Admitting: Emergency Medicine

## 2013-05-13 ENCOUNTER — Encounter (HOSPITAL_COMMUNITY): Payer: Self-pay | Admitting: Emergency Medicine

## 2013-05-13 DIAGNOSIS — Z79899 Other long term (current) drug therapy: Secondary | ICD-10-CM | POA: Insufficient documentation

## 2013-05-13 DIAGNOSIS — I10 Essential (primary) hypertension: Secondary | ICD-10-CM | POA: Insufficient documentation

## 2013-05-13 DIAGNOSIS — G8929 Other chronic pain: Secondary | ICD-10-CM | POA: Insufficient documentation

## 2013-05-13 DIAGNOSIS — M533 Sacrococcygeal disorders, not elsewhere classified: Secondary | ICD-10-CM | POA: Insufficient documentation

## 2013-05-13 DIAGNOSIS — F3289 Other specified depressive episodes: Secondary | ICD-10-CM | POA: Insufficient documentation

## 2013-05-13 DIAGNOSIS — Z87891 Personal history of nicotine dependence: Secondary | ICD-10-CM | POA: Insufficient documentation

## 2013-05-13 DIAGNOSIS — F411 Generalized anxiety disorder: Secondary | ICD-10-CM | POA: Insufficient documentation

## 2013-05-13 DIAGNOSIS — F329 Major depressive disorder, single episode, unspecified: Secondary | ICD-10-CM | POA: Insufficient documentation

## 2013-05-13 DIAGNOSIS — M545 Low back pain, unspecified: Secondary | ICD-10-CM | POA: Insufficient documentation

## 2013-05-13 DIAGNOSIS — R062 Wheezing: Secondary | ICD-10-CM | POA: Insufficient documentation

## 2013-05-13 DIAGNOSIS — Z792 Long term (current) use of antibiotics: Secondary | ICD-10-CM | POA: Insufficient documentation

## 2013-05-13 DIAGNOSIS — G40909 Epilepsy, unspecified, not intractable, without status epilepticus: Secondary | ICD-10-CM | POA: Insufficient documentation

## 2013-05-13 NOTE — ED Notes (Addendum)
Pt c/o mid back pain x 1 year. Reports that the pain will wake him up at night. States he has been told he has arthritis in his back, denies any continuing care or medications to manage the pain. States he is currently trying to get a PCP now that he has insurance.

## 2013-05-13 NOTE — ED Provider Notes (Signed)
CSN: [id number]     Arrival date & time 05/13/13  2225 History   First MD Initiated Contact with Patient 05/13/13 2324     Chief Complaint  Patient presents with  . Back Pain   (Consider location/radiation/quality/duration/timing/severity/associated sxs/prior Treatment) Patient is a 38 y.o. male presenting with back pain. The history is provided by the patient.  Back Pain Location:  Lumbar spine Quality:  Shooting Radiates to: radiates to the lower abd. Pain severity:  Moderate Pain is:  Same all the time Onset quality:  Gradual Duration:  1 month Timing:  Intermittent Progression:  Worsening Chronicity:  Chronic Context: not falling   Context comment:  Hx of back pain for "a while". Relieved by:  Nothing Worsened by:  Sitting and twisting Ineffective treatments:  None tried Associated symptoms: no abdominal pain, no bladder incontinence, no bowel incontinence, no chest pain, no dysuria and no perianal numbness     Past Medical History  Diagnosis Date  . Hypertension   . Anxiety   . Depression   . Asthma   . Seizures     outgrew by age 38  . Chronic back pain    History reviewed. No pertinent past surgical history. History reviewed. No pertinent family history. History  Substance Use Topics  . Smoking status: Former Smoker -- 1.00 packs/day for 2 years    Types: Cigarettes    Quit date: 04/08/2012  . Smokeless tobacco: Never Used  . Alcohol Use: No    Review of Systems  Constitutional: Negative for activity change.       All ROS Neg except as noted in HPI  HENT: Negative for nosebleeds.   Eyes: Negative for photophobia and discharge.  Respiratory: Positive for wheezing. Negative for cough and shortness of breath.   Cardiovascular: Negative for chest pain and palpitations.  Gastrointestinal: Negative for abdominal pain, blood in stool and bowel incontinence.  Genitourinary: Negative for bladder incontinence, dysuria, frequency and hematuria.  Musculoskeletal:  Positive for back pain. Negative for arthralgias and neck pain.  Skin: Negative.   Neurological: Positive for seizures. Negative for dizziness and speech difficulty.  Psychiatric/Behavioral: Negative for hallucinations and confusion. The patient is nervous/anxious.     Allergies  Review of patient's allergies indicates no known allergies.  Home Medications   Current Outpatient Rx  Name  Route  Sig  Dispense  Refill  . citalopram (CELEXA) 20 MG tablet   Oral   Take 1 tablet (20 mg total) by mouth daily.   30 tablet   0   . gentamicin (GARAMYCIN) 0.3 % ophthalmic solution   Right Eye   Place 1 drop into the right eye every 4 (four) hours.   5 mL   0   . hydrocortisone cream 1 %      Apply to affected area 2 times daily for itching   15 g   0   . ibuprofen (ADVIL,MOTRIN) 200 MG tablet   Oral   Take 400 mg by mouth every 8 (eight) hours as needed for pain.          Marland Kitchen. loratadine (CLARITIN) 10 MG tablet   Oral   Take 1 tablet (10 mg total) by mouth daily.   15 tablet   0   . traMADol (ULTRAM) 50 MG tablet   Oral   Take 1 tablet (50 mg total) by mouth every 8 (eight) hours as needed for pain.   20 tablet   0    BP 129/73  Pulse  82  Temp(Src) 97.4 F (36.3 C) (Oral)  Resp 20  Ht 5\' 11"  (1.803 m)  Wt 319 lb 9.6 oz (144.97 kg)  BMI 44.60 kg/m2  SpO2 98% Physical Exam  Nursing note and vitals reviewed. Constitutional: He is oriented to person, place, and time. He appears well-developed and well-nourished.  Non-toxic appearance.  HENT:  Head: Normocephalic.  Right Ear: Tympanic membrane and external ear normal.  Left Ear: Tympanic membrane and external ear normal.  Eyes: EOM and lids are normal. Pupils are equal, round, and reactive to light.  Neck: Normal range of motion. Neck supple. Carotid bruit is not present.  Cardiovascular: Normal rate, regular rhythm, normal heart sounds, intact distal pulses and normal pulses.   Pulmonary/Chest: Breath sounds normal.  No respiratory distress.  Abdominal: Soft. Bowel sounds are normal. There is no tenderness. There is no guarding.  Musculoskeletal: Normal range of motion.       Arms: Lymphadenopathy:       Head (right side): No submandibular adenopathy present.       Head (left side): No submandibular adenopathy present.    He has no cervical adenopathy.  Neurological: He is alert and oriented to person, place, and time. He has normal strength. No cranial nerve deficit or sensory deficit. GCS eye subscore is 4. GCS verbal subscore is 5. GCS motor subscore is 6.  Gait is slow but steady. Pain with ambulation .  Skin: Skin is warm and dry.  Psychiatric: He has a normal mood and affect. His speech is normal.    ED Course  Procedures (including critical care time) Labs Review Labs Reviewed - No data to display Imaging Review No results found.  EKG Interpretation   None       MDM  No diagnosis found. **I have reviewed nursing notes, vital signs, and all appropriate lab and imaging results for this patient.* Pt has hx of DDD. No gross deficit noted. He presents to ED for assistance with back pain.No loss of bowel or bladder. Pt is in between physicians at this time, looking for MD's taking new patients.  Plan - Rx for tramadol, diazepam, and decadron given to the patient given.    Kathie Dike, PA-C 05/14/13 0010

## 2013-05-13 NOTE — ED Notes (Signed)
Pt reporting pain in back that prevents him from sleeping.  Reports that he also has headaches.  States symptoms have been going on "for a while now".

## 2013-05-14 MED ORDER — DIAZEPAM 5 MG PO TABS: 5.0000 mg | ORAL_TABLET | Freq: Three times a day (TID) | ORAL | Status: DC

## 2013-05-14 MED ORDER — PREDNISONE 10 MG PO TABS
60.0000 mg | ORAL_TABLET | Freq: Once | ORAL | Status: AC
  Administered 2013-05-14: 60 mg via ORAL
  Filled 2013-05-14 (×2): qty 1

## 2013-05-14 MED ORDER — ONDANSETRON HCL 4 MG PO TABS
4.0000 mg | ORAL_TABLET | Freq: Once | ORAL | Status: AC
  Administered 2013-05-14: 4 mg via ORAL
  Filled 2013-05-14: qty 1

## 2013-05-14 MED ORDER — TRAMADOL HCL 50 MG PO TABS: ORAL_TABLET | ORAL | Status: DC

## 2013-05-14 MED ORDER — DEXAMETHASONE 4 MG PO TABS: ORAL_TABLET | ORAL | Status: DC

## 2013-05-14 MED ORDER — TRAMADOL HCL 50 MG PO TABS
100.0000 mg | ORAL_TABLET | Freq: Once | ORAL | Status: AC
  Administered 2013-05-14: 100 mg via ORAL
  Filled 2013-05-14: qty 2

## 2013-05-14 MED ORDER — DIAZEPAM 5 MG PO TABS
5.0000 mg | ORAL_TABLET | Freq: Once | ORAL | Status: AC
  Administered 2013-05-14: 5 mg via ORAL
  Filled 2013-05-14: qty 1

## 2013-05-14 NOTE — Discharge Instructions (Signed)
Back Pain, Adult  Back pain is very common. The pain often gets better over time. The cause of back pain is usually not dangerous. Most people can learn to manage their back pain on their own.   HOME CARE   · Stay active. Start with short walks on flat ground if you can. Try to walk farther each day.  · Do not sit, drive, or stand in one place for more than 30 minutes. Do not stay in bed.  · Do not avoid exercise or work. Activity can help your back heal faster.  · Be careful when you bend or lift an object. Bend at your knees, keep the object close to you, and do not twist.  · Sleep on a firm mattress. Lie on your side, and bend your knees. If you lie on your back, put a pillow under your knees.  · Only take medicines as told by your doctor.  · Put ice on the injured area.  · Put ice in a plastic bag.  · Place a towel between your skin and the bag.  · Leave the ice on for 15-20 minutes, 03-04 times a day for the first 2 to 3 days. After that, you can switch between ice and heat packs.  · Ask your doctor about back exercises or massage.  · Avoid feeling anxious or stressed. Find good ways to deal with stress, such as exercise.  GET HELP RIGHT AWAY IF:   · Your pain does not go away with rest or medicine.  · Your pain does not go away in 1 week.  · You have new problems.  · You do not feel well.  · The pain spreads into your legs.  · You cannot control when you poop (bowel movement) or pee (urinate).  · Your arms or legs feel weak or lose feeling (numbness).  · You feel sick to your stomach (nauseous) or throw up (vomit).  · You have belly (abdominal) pain.  · You feel like you may pass out (faint).  MAKE SURE YOU:   · Understand these instructions.  · Will watch your condition.  · Will get help right away if you are not doing well or get worse.  Document Released: 09/12/2007 Document Revised: 06/18/2011 Document Reviewed: 08/14/2010  [employer]® Patient Information ©2014 [employer].

## 2013-05-14 NOTE — ED Provider Notes (Signed)
Medical screening examination/treatment/procedure(s) were performed by non-physician practitioner and as supervising physician I was immediately available for consultation/collaboration.  EKG Interpretation   None         Lyanne CoKevin M Marabeth Melland, MD 05/14/13 [phone]

## 2013-06-17 ENCOUNTER — Telehealth: Payer: Self-pay | Admitting: Orthopedic Surgery

## 2013-06-17 NOTE — Telephone Encounter (Signed)
Patient stopped in our office to inquire about an appointment following visit to [facility] for problem of back pain, chronic.  Discussed appointment option, patient wishes to hold as he is trying to find and establish care with a primary care doctor, and also, has a high deductible insurance. Will call if needs the late March/early April appointment.

## 2013-06-29 ENCOUNTER — Emergency Department (HOSPITAL_COMMUNITY)
Admission: EM | Admit: 2013-06-29 | Discharge: 2013-06-29 | Disposition: A | Discharge status: Home / Self Care | Payer: BC Managed Care – PPO | Attending: Emergency Medicine | Admitting: Emergency Medicine

## 2013-06-29 ENCOUNTER — Encounter (HOSPITAL_COMMUNITY): Payer: Self-pay | Admitting: Emergency Medicine

## 2013-06-29 DIAGNOSIS — I10 Essential (primary) hypertension: Secondary | ICD-10-CM | POA: Insufficient documentation

## 2013-06-29 DIAGNOSIS — F3289 Other specified depressive episodes: Secondary | ICD-10-CM | POA: Insufficient documentation

## 2013-06-29 DIAGNOSIS — R369 Urethral discharge, unspecified: Secondary | ICD-10-CM | POA: Insufficient documentation

## 2013-06-29 DIAGNOSIS — Z79899 Other long term (current) drug therapy: Secondary | ICD-10-CM | POA: Insufficient documentation

## 2013-06-29 DIAGNOSIS — F329 Major depressive disorder, single episode, unspecified: Secondary | ICD-10-CM | POA: Insufficient documentation

## 2013-06-29 DIAGNOSIS — F411 Generalized anxiety disorder: Secondary | ICD-10-CM | POA: Insufficient documentation

## 2013-06-29 DIAGNOSIS — J45909 Unspecified asthma, uncomplicated: Secondary | ICD-10-CM | POA: Insufficient documentation

## 2013-06-29 DIAGNOSIS — Z87891 Personal history of nicotine dependence: Secondary | ICD-10-CM | POA: Insufficient documentation

## 2013-06-29 DIAGNOSIS — G8929 Other chronic pain: Secondary | ICD-10-CM | POA: Insufficient documentation

## 2013-06-29 LAB — URINALYSIS, ROUTINE W REFLEX MICROSCOPIC
Bilirubin Urine: NEGATIVE
Glucose, UA: NEGATIVE mg/dL
Hgb urine dipstick: NEGATIVE
Ketones, ur: NEGATIVE mg/dL
LEUKOCYTES UA: NEGATIVE
NITRITE: NEGATIVE
Protein, ur: NEGATIVE mg/dL
UROBILINOGEN UA: 0.2 mg/dL (ref 0.0–1.0)
pH: 6 (ref 5.0–8.0)

## 2013-06-29 MED ORDER — LIDOCAINE HCL (PF) 1 % IJ SOLN
INTRAMUSCULAR | Status: AC
  Filled 2013-06-29: qty 5

## 2013-06-29 MED ORDER — CEFTRIAXONE SODIUM 250 MG IJ SOLR
250.0000 mg | Freq: Once | INTRAMUSCULAR | Status: AC
  Administered 2013-06-29: 250 mg via INTRAMUSCULAR
  Filled 2013-06-29: qty 250

## 2013-06-29 MED ORDER — AZITHROMYCIN 1 G PO PACK
1.0000 g | PACK | Freq: Once | ORAL | Status: AC
  Administered 2013-06-29: 1 g via ORAL
  Filled 2013-06-29: qty 1

## 2013-06-29 NOTE — ED Provider Notes (Signed)
CSN: [id number]     Arrival date & time 06/29/13  1223 History   First MD Initiated Contact with Patient 06/29/13 1242     Chief Complaint  Patient presents with  . Penile Discharge     (Consider location/radiation/quality/duration/timing/severity/associated sxs/prior Treatment) Patient is a 38 y.o. male presenting with penile discharge. The history is provided by the patient. No language interpreter was used.  Penile Discharge The current episode started 1 to 4 weeks ago. Associated symptoms include urinary symptoms. Pertinent negatives include no chills, fever, nausea, rash, vomiting or weakness.   Pt is a 38 year old male who presents with c/o penile discharge. He reports that he has been having symptoms for about 3-4 weeks. He says that his wife recently had an STD. He reports that he has burning with urination. He denies hematuria, back pain or other urinary symptoms. He has had penile discharge and drainage for the same amount of time. Past Medical History  Diagnosis Date  . Hypertension   . Anxiety   . Depression   . Asthma   . Seizures     outgrew by age 11  . Chronic back pain    History reviewed. No pertinent past surgical history. History reviewed. No pertinent family history. History  Substance Use Topics  . Smoking status: Former Smoker -- 1.00 packs/day for 2 years    Types: Cigarettes    Quit date: 04/08/2012  . Smokeless tobacco: Never Used  . Alcohol Use: No    Review of Systems  Constitutional: Negative for fever and chills.  Gastrointestinal: Negative for nausea and vomiting.  Genitourinary: Positive for discharge.  Skin: Negative for rash.  Neurological: Negative for weakness.  All other systems reviewed and are negative.      Allergies  Review of patient's allergies indicates no known allergies.  Home Medications   Current Outpatient Rx  Name  Route  Sig  Dispense  Refill  . citalopram (CELEXA) 20 MG tablet   Oral   Take 1 tablet (20 mg  total) by mouth daily.   30 tablet   0    BP 119/70  Pulse 79  Temp(Src) 97.8 F (36.6 C) (Oral)  Resp 14  SpO2 100% Physical Exam  Nursing note and vitals reviewed. Constitutional: He is oriented to person, place, and time. He appears well-developed and well-nourished. No distress.  HENT:  Head: Normocephalic and atraumatic.  Eyes: Conjunctivae and EOM are normal.  Neck: Normal range of motion. Neck supple. No JVD present. No tracheal deviation present. No thyromegaly present.  Cardiovascular: Normal rate, regular rhythm and normal heart sounds.   Pulmonary/Chest: Effort normal and breath sounds normal.  Genitourinary: Testes normal. Circumcised. Discharge found.  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  Musculoskeletal: Normal range of motion.  Neurological: He is alert and oriented to person, place, and time.  Skin: Skin is warm and dry. No rash noted.  Psychiatric: He has a normal mood and affect. His behavior is normal. Judgment and thought content normal.    ED Course  Procedures (including critical care time) Labs Review Labs Reviewed  URINALYSIS, ROUTINE W REFLEX MICROSCOPIC - Abnormal; Notable for the following:    APPearance HAZY (*)    Specific Gravity, Urine >1.030 (*)    All other components within normal limits  GC/CHLAMYDIA PROBE AMP   Imaging Review No results found.   EKG Interpretation None      MDM   Final diagnoses:  Penile discharge    Wife has  had a recent STD and yeast infection. GC/Chlamydia obtained. Will treat due to symptoms. Pt given azithromycin 1 gm po and ceftriaxone 250 mg IM. Discussed plan of care with pt and he agrees. Return precautions given.       Irish EldersKelly Geoffrey Hynes, NP 07/01/13 2238  Irish EldersKelly Oaklyn Mans, NP 07/01/13 2240

## 2013-06-29 NOTE — ED Notes (Signed)
Pt c/o foul smelling penile discharge and chronic bilateral knee pain.

## 2013-06-29 NOTE — ED Notes (Signed)
Pt with penile discharge for a month, denies N/V/D, + for burning on urination, states discharge is green in color

## 2013-06-29 NOTE — Discharge Instructions (Signed)
Urethritis, Adult Urethritis is an inflammation of the tube through which urine exits your bladder (urethra).  CAUSES Urethritis is often caused by an infection in your urethra. The infection can be viral, like herpes. The infection can also be bacterial, like gonorrhea. RISK FACTORS Risk factors of urethritis include:  Having sex without using a condom.  Having multiple sexual partners.  Having poor hygiene. SIGNS AND SYMPTOMS Symptoms of urethritis are less noticeable in women than in men. These symptoms include:  Burning feeling when you urinate (dysuria).  Discharge from your urethra.  Blood in your urine (hematuria).  Urinating more than usual. DIAGNOSIS  To confirm a diagnosis of urethritis, your health care provider will do the following:  Ask about your sexual history.  Perform a physical exam.  Have you provide a sample of your urine for lab testing.  Use a cotton swab to gently collect a sample from your urethra for lab testing. TREATMENT  It is important to treat urethritis. Depending on the cause, untreated urethritis may lead to serious genital infections and possibly infertility. Urethritis caused by a bacterial infection is treated with antibiotics. All sexual partners must be treated.  HOME CARE INSTRUCTIONS  Do not have sex until the test results are known and treatment is completed, even if your symptoms go away before you finish treatment.  Finish all medicines that you are prescribed. SEEK MEDICAL CARE IF:   Your symptoms are not improved in 3 days.  Your symptoms are getting worse.  You develop abdominal pain or pelvic pain (in women).  You develop joint pain. SEEK IMMEDIATE MEDICAL CARE IF:   You have a fever with a temperature of 101.53F (38.8C) or greater.  You have severe pain in the belly, back, or side.  You have repeated vomiting. Document Released: 09/19/2000 Document Revised: 01/14/2013 Document Reviewed: 11/24/2012 [facility]  Patient Information 2014 [place]ExitCare, [place].   Drink a lot of fluids You were treated for STD exposure here Continue to use hydrocortisone cream on penis

## 2013-06-30 LAB — GC/CHLAMYDIA PROBE AMP
CT PROBE, AMP APTIMA: NEGATIVE
GC PROBE AMP APTIMA: NEGATIVE

## 2013-07-06 NOTE — ED Provider Notes (Signed)
Medical screening examination/treatment/procedure(s) were performed by non-physician practitioner and as supervising physician I was immediately available for consultation/collaboration.   EKG Interpretation None        Shelda JakesScott W. Deshaun Weisinger, MD 07/06/13 [phone]

## 2013-08-19 ENCOUNTER — Encounter (HOSPITAL_COMMUNITY): Payer: Self-pay | Admitting: Emergency Medicine

## 2013-08-19 ENCOUNTER — Emergency Department (HOSPITAL_COMMUNITY)
Admission: EM | Admit: 2013-08-19 | Discharge: 2013-08-19 | Discharge status: Against Medical Advice | Payer: BC Managed Care – PPO | Attending: Emergency Medicine | Admitting: Emergency Medicine

## 2013-08-19 DIAGNOSIS — G8929 Other chronic pain: Secondary | ICD-10-CM | POA: Insufficient documentation

## 2013-08-19 DIAGNOSIS — R339 Retention of urine, unspecified: Secondary | ICD-10-CM | POA: Insufficient documentation

## 2013-08-19 DIAGNOSIS — S6990XA Unspecified injury of unspecified wrist, hand and finger(s), initial encounter: Principal | ICD-10-CM

## 2013-08-19 DIAGNOSIS — S99919A Unspecified injury of unspecified ankle, initial encounter: Secondary | ICD-10-CM

## 2013-08-19 DIAGNOSIS — S59919A Unspecified injury of unspecified forearm, initial encounter: Principal | ICD-10-CM

## 2013-08-19 DIAGNOSIS — S8990XA Unspecified injury of unspecified lower leg, initial encounter: Secondary | ICD-10-CM | POA: Insufficient documentation

## 2013-08-19 DIAGNOSIS — R296 Repeated falls: Secondary | ICD-10-CM | POA: Insufficient documentation

## 2013-08-19 DIAGNOSIS — S59909A Unspecified injury of unspecified elbow, initial encounter: Secondary | ICD-10-CM | POA: Insufficient documentation

## 2013-08-19 DIAGNOSIS — J45909 Unspecified asthma, uncomplicated: Secondary | ICD-10-CM | POA: Insufficient documentation

## 2013-08-19 DIAGNOSIS — Y9289 Other specified places as the place of occurrence of the external cause: Secondary | ICD-10-CM | POA: Insufficient documentation

## 2013-08-19 DIAGNOSIS — Y990 Civilian activity done for income or pay: Secondary | ICD-10-CM | POA: Insufficient documentation

## 2013-08-19 DIAGNOSIS — I10 Essential (primary) hypertension: Secondary | ICD-10-CM | POA: Insufficient documentation

## 2013-08-19 DIAGNOSIS — S99929A Unspecified injury of unspecified foot, initial encounter: Secondary | ICD-10-CM

## 2013-08-19 NOTE — ED Notes (Signed)
Pt states pain to right great toe from hitting it on something Saturday. Pt also states left wrist pain after falling at work on Monday near a pond. Pt also states pain to lower back which radiates down right leg. Pt also states "problem with leakage of urine after I am finished urinating" which pt has been seen and treated for before but states he is no better.

## 2013-08-24 ENCOUNTER — Emergency Department (HOSPITAL_COMMUNITY): Payer: BC Managed Care – PPO

## 2013-08-24 ENCOUNTER — Emergency Department (HOSPITAL_COMMUNITY)
Admission: EM | Admit: 2013-08-24 | Discharge: 2013-08-25 | Disposition: A | Discharge status: Home / Self Care | Payer: BC Managed Care – PPO | Attending: Emergency Medicine | Admitting: Emergency Medicine

## 2013-08-24 ENCOUNTER — Encounter (HOSPITAL_COMMUNITY): Payer: Self-pay | Admitting: Emergency Medicine

## 2013-08-24 DIAGNOSIS — Z79899 Other long term (current) drug therapy: Secondary | ICD-10-CM | POA: Insufficient documentation

## 2013-08-24 DIAGNOSIS — Z8669 Personal history of other diseases of the nervous system and sense organs: Secondary | ICD-10-CM | POA: Insufficient documentation

## 2013-08-24 DIAGNOSIS — R296 Repeated falls: Secondary | ICD-10-CM | POA: Insufficient documentation

## 2013-08-24 DIAGNOSIS — S90121A Contusion of right lesser toe(s) without damage to nail, initial encounter: Secondary | ICD-10-CM

## 2013-08-24 DIAGNOSIS — Y929 Unspecified place or not applicable: Secondary | ICD-10-CM | POA: Insufficient documentation

## 2013-08-24 DIAGNOSIS — S63501A Unspecified sprain of right wrist, initial encounter: Secondary | ICD-10-CM

## 2013-08-24 DIAGNOSIS — I10 Essential (primary) hypertension: Secondary | ICD-10-CM | POA: Insufficient documentation

## 2013-08-24 DIAGNOSIS — J45909 Unspecified asthma, uncomplicated: Secondary | ICD-10-CM | POA: Insufficient documentation

## 2013-08-24 DIAGNOSIS — F3289 Other specified depressive episodes: Secondary | ICD-10-CM | POA: Insufficient documentation

## 2013-08-24 DIAGNOSIS — M549 Dorsalgia, unspecified: Secondary | ICD-10-CM

## 2013-08-24 DIAGNOSIS — Y939 Activity, unspecified: Secondary | ICD-10-CM | POA: Insufficient documentation

## 2013-08-24 DIAGNOSIS — S90129A Contusion of unspecified lesser toe(s) without damage to nail, initial encounter: Secondary | ICD-10-CM | POA: Insufficient documentation

## 2013-08-24 DIAGNOSIS — G8929 Other chronic pain: Secondary | ICD-10-CM | POA: Insufficient documentation

## 2013-08-24 DIAGNOSIS — F329 Major depressive disorder, single episode, unspecified: Secondary | ICD-10-CM | POA: Insufficient documentation

## 2013-08-24 DIAGNOSIS — S63509A Unspecified sprain of unspecified wrist, initial encounter: Secondary | ICD-10-CM | POA: Insufficient documentation

## 2013-08-24 DIAGNOSIS — IMO0002 Reserved for concepts with insufficient information to code with codable children: Secondary | ICD-10-CM | POA: Insufficient documentation

## 2013-08-24 DIAGNOSIS — Z87891 Personal history of nicotine dependence: Secondary | ICD-10-CM | POA: Insufficient documentation

## 2013-08-24 DIAGNOSIS — F411 Generalized anxiety disorder: Secondary | ICD-10-CM | POA: Insufficient documentation

## 2013-08-24 MED ORDER — HYDROCODONE-ACETAMINOPHEN 5-325 MG PO TABS: 2.0000 | ORAL_TABLET | ORAL | Status: DC | PRN

## 2013-08-24 NOTE — ED Provider Notes (Signed)
CSN: [id number]     Arrival date & time 08/24/13  2144 History   First MD Initiated Contact with Patient 08/24/13 2224     Chief Complaint  Patient presents with  . Leg Pain     (Consider location/radiation/quality/duration/timing/severity/associated sxs/prior Treatment) Patient is a 38 y.o. male presenting with leg pain. The history is provided by the patient. No language interpreter was used.  Leg Pain Location:  Toe Injury: yes   Mechanism of injury: fall   Fall:    Height of fall:  Standing   Point of impact:  Hands   Entrapped after fall: no   Toe location:  R big toe Pain details:    Quality:  Aching   Radiates to:  Does not radiate   Severity:  Moderate   Onset quality:  Gradual Pt has multiple complaints.  Pt has chronic back pain.   Pt hit toes several weeks ago and is swollen.  Pt fell this week and injured his right wrist  Past Medical History  Diagnosis Date  . Hypertension   . Anxiety   . Depression   . Asthma   . Seizures     outgrew by age 38  . Chronic back pain    History reviewed. No pertinent past surgical history. History reviewed. No pertinent family history. History  Substance Use Topics  . Smoking status: Former Smoker -- 1.00 packs/day for 2 years    Types: Cigarettes    Quit date: 04/08/2012  . Smokeless tobacco: Never Used  . Alcohol Use: No    Review of Systems  All other systems reviewed and are negative.     Allergies  Review of patient's allergies indicates no known allergies.  Home Medications   Prior to Admission medications   Medication Sig Start Date End Date Taking? Authorizing Provider  citalopram (CELEXA) 20 MG tablet Take 1 tablet (20 mg total) by mouth daily. 09/02/12  Yes Nathan R. Pickering, MD   BP 144/85  Pulse 88  Temp(Src) 98.3 F (36.8 C) (Oral)  Resp 20  Wt 309 lb 8 oz (140.388 kg)  SpO2 98% Physical Exam  Nursing note and vitals reviewed. Constitutional: He is oriented to person, place, and time. He  appears well-developed and well-nourished.  HENT:  Head: Normocephalic.  Eyes: EOM are normal. Pupils are equal, round, and reactive to light.  Neck: Normal range of motion.  Pulmonary/Chest: Effort normal.  Abdominal: Soft. He exhibits no distension.  Musculoskeletal:  Diffusely tender ls spine Tender right wrist,  From with popping.   Right 1st toe, swollen tender  Neurological: He is alert and oriented to person, place, and time.  Psychiatric: He has a normal mood and affect.    ED Course  Procedures (including critical care time) Labs Review Labs Reviewed - No data to display  Imaging Review Dg Wrist Complete Right  08/24/2013   CLINICAL DATA:  LEG PAIN LEG PAIN  EXAM: RIGHT WRIST - COMPLETE 3+ VIEW  COMPARISON:  None.  FINDINGS: There is no evidence of fracture or dislocation. There is no evidence of arthropathy or other focal bone abnormality. Soft tissues are unremarkable.  IMPRESSION: Negative.   Electronically Signed   By: Salome HolmesHector  Cooper M.D.   On: 08/24/2013 23:33   Dg Foot Complete Right  08/24/2013   CLINICAL DATA:  Right toe pain after fall 1 week ago.  EXAM: RIGHT FOOT COMPLETE - 3+ VIEW  COMPARISON:  None.  FINDINGS: There is no evidence of fracture or  dislocation. There is no evidence of arthropathy or other focal bone abnormality. Moderate calcaneal spur at plantar fascia insertion. Apparent cystic, likely degenerative change of the base of the second metatarsus. Corticated bony fragments at first interphalangeal joint may reflect remote injury. Soft tissues are unremarkable.  IMPRESSION: No acute fracture deformity or dislocation.   Electronically Signed   By: Awilda Metroourtnay  Bloomer   On: 08/24/2013 23:38     EKG Interpretation None       no fractures.   Pt given rx for hydrocodone.   I advised him to see his Primary Md for recheck   Final diagnoses:  Sprain of wrist, right  Contusion of toe, right  Back pain        Elson AreasLeslie K Stephan Nelis, PA-C 08/25/13 0000

## 2013-08-24 NOTE — ED Notes (Signed)
bil lower leg pain, headache, back pain, rt wrist pain. Came to ER 5/13, but left before being seen.

## 2013-08-25 NOTE — Discharge Instructions (Signed)
Back Pain, Adult Low back pain is very common. About 1 in 5 people have back pain.The cause of low back pain is rarely dangerous. The pain often gets better over time.About half of people with a sudden onset of back pain feel better in just 2 weeks. About 8 in 10 people feel better by 6 weeks.  CAUSES Some common causes of back pain include:  Strain of the muscles or ligaments supporting the spine.  Wear and tear (degeneration) of the spinal discs.  Arthritis.  Direct injury to the back. DIAGNOSIS Most of the time, the direct cause of low back pain is not known.However, back pain can be treated effectively even when the exact cause of the pain is unknown.Answering your caregiver's questions about your overall health and symptoms is one of the most accurate ways to make sure the cause of your pain is not dangerous. If your caregiver needs more information, he or she may order lab work or imaging tests (X-rays or MRIs).However, even if imaging tests show changes in your back, this usually does not require surgery. HOME CARE INSTRUCTIONS For many people, back pain returns.Since low back pain is rarely dangerous, it is often a condition that people can learn to [facility] their own.   Remain active. It is stressful on the back to sit or stand in one place. Do not sit, drive, or stand in one place for more than 30 minutes at a time. Take short walks on level surfaces as soon as pain allows.Try to increase the length of time you walk each day.  Do not stay in bed.Resting more than 1 or 2 days can delay your recovery.  Do not avoid exercise or work.Your body is made to move.It is not dangerous to be active, even though your back may hurt.Your back will likely heal faster if you return to being active before your pain is gone.  Pay attention to your body when you bend and lift. Many people have less discomfortwhen lifting if they bend their knees, keep the load close to their bodies,and  avoid twisting. Often, the most comfortable positions are those that put less stress on your recovering back.  Find a comfortable position to sleep. Use a firm mattress and lie on your side with your knees slightly bent. If you lie on your back, put a pillow under your knees.  Only take over-the-counter or prescription medicines as directed by your caregiver. Over-the-counter medicines to reduce pain and inflammation are often the most helpful.Your caregiver may prescribe muscle relaxant drugs.These medicines help dull your pain so you can more quickly return to your normal activities and healthy exercise.  Put ice on the injured area.  Put ice in a plastic bag.  Place a towel between your skin and the bag.  Leave the ice on for 15-20 minutes, 03-04 times a day for the first 2 to 3 days. After that, ice and heat may be alternated to reduce pain and spasms.  Ask your caregiver about trying back exercises and gentle massage. This may be of some benefit.  Avoid feeling anxious or stressed.Stress increases muscle tension and can worsen back pain.It is important to recognize when you are anxious or stressed and learn ways to manage it.Exercise is a great option. SEEK MEDICAL CARE IF:  You have pain that is not relieved with rest or medicine.  You have pain that does not improve in 1 week.  You have new symptoms.  You are generally not feeling well. SEEK  IMMEDIATE MEDICAL CARE IF:   You have pain that radiates from your back into your legs.  You develop new bowel or bladder control problems.  You have unusual weakness or numbness in your arms or legs.  You develop nausea or vomiting.  You develop abdominal pain.  You feel faint. Document Released: 03/26/2005 Document Revised: 09/25/2011 Document Reviewed: 08/14/2010 [facility] Patient Information 2014 [place]ExitCare, [place]LL[place]. Contusion A contusion is a deep bruise. Contusions are the result of an injury that caused bleeding under the  skin. The contusion may turn blue, purple, or yellow. Minor injuries will give you a painless contusion, but more severe contusions may stay painful and swollen for a few weeks.  CAUSES  A contusion is usually caused by a blow, trauma, or direct force to an area of the body. SYMPTOMS   Swelling and redness of the injured area.  Bruising of the injured area.  Tenderness and soreness of the injured area.  Pain. DIAGNOSIS  The diagnosis can be made by taking a history and physical exam. An X-ray, CT scan, or MRI may be needed to determine if there were any associated injuries, such as fractures. TREATMENT  Specific treatment will depend on what area of the body was injured. In general, the best treatment for a contusion is resting, icing, elevating, and applying cold compresses to the injured area. Over-the-counter medicines may also be recommended for pain control. Ask your caregiver what the best treatment is for your contusion. HOME CARE INSTRUCTIONS   Put ice on the injured area.  Put ice in a plastic bag.  Place a towel between your skin and the bag.  Leave the ice on for 15-20 minutes, 03-04 times a day.  Only take over-the-counter or prescription medicines for pain, discomfort, or fever as directed by your caregiver. Your caregiver may recommend avoiding anti-inflammatory medicines (aspirin, ibuprofen, and naproxen) for 48 hours because these medicines may increase bruising.  Rest the injured area.  If possible, elevate the injured area to reduce swelling. SEEK IMMEDIATE MEDICAL CARE IF:   You have increased bruising or swelling.  You have pain that is getting worse.  Your swelling or pain is not relieved with medicines. MAKE SURE YOU:   Understand these instructions.  Will watch your condition.  Will get help right away if you are not doing well or get worse. Document Released: 01/03/2005 Document Revised: 06/18/2011 Document Reviewed: 01/29/2011 [facility] DivisionExit[facility] Patient  Information 2014 [place]Care, [place]LLC. Wrist Pain Wrist injuries are frequent in adults and children. A sprain is an injury to the ligaments that hold your bones together. A strain is an injury to muscle or muscle cord-like structures (tendons) from stretching or pulling. Generally, when wrists are moderately tender to touch following a fall or injury, a break in the bone (fracture) may be present. Most wrist sprains or strains are better in 3 to 5 days, but complete healing may take several weeks. HOME CARE INSTRUCTIONS   Put ice on the injured area.  Put ice in a plastic bag.  Place a towel between your skin and the bag.  Leave the ice on for 15-20 minutes, 03-04 times a day, for the first 2 days.  Keep your arm raised above the level of your heart whenever possible to reduce swelling and pain.  Rest the injured area for at least 48 hours or as directed by your caregiver.  If a splint or elastic bandage has been applied, use it for as long as directed by your caregiver or  until seen by a caregiver for a follow-up exam.  Only take over-the-counter or prescription medicines for pain, discomfort, or fever as directed by your caregiver.  Keep all follow-up appointments. You may need to follow up with a specialist or have follow-up X-rays. Improvement in pain level is not a guarantee that you did not fracture a bone in your wrist. The only way to determine whether or not you have a broken bone is by X-ray. SEEK IMMEDIATE MEDICAL CARE IF:   Your fingers are swollen, very red, white, or cold and blue.  Your fingers are numb or tingling.  You have increasing pain.  You have difficulty moving your fingers. MAKE SURE YOU:   Understand these instructions.  Will watch your condition.  Will get help right away if you are not doing well or get worse. Document Released: 01/03/2005 Document Revised: 06/18/2011 Document Reviewed: 05/17/2010 [facility] Patient Information 2014 [place]ExitCare, [place]LL[place].

## 2013-08-28 NOTE — ED Provider Notes (Signed)
Medical screening examination/treatment/procedure(s) were performed by non-physician practitioner and as supervising physician I was immediately available for consultation/collaboration.   Aleshka Corney T Naji Mehringer, MD 08/28/13 2233 

## 2013-09-12 ENCOUNTER — Emergency Department (HOSPITAL_COMMUNITY)
Admission: EM | Admit: 2013-09-12 | Discharge: 2013-09-12 | Disposition: A | Discharge status: Home / Self Care | Payer: BC Managed Care – PPO | Attending: Emergency Medicine | Admitting: Emergency Medicine

## 2013-09-12 ENCOUNTER — Encounter (HOSPITAL_COMMUNITY): Payer: Self-pay | Admitting: Emergency Medicine

## 2013-09-12 DIAGNOSIS — Z87891 Personal history of nicotine dependence: Secondary | ICD-10-CM | POA: Insufficient documentation

## 2013-09-12 DIAGNOSIS — I10 Essential (primary) hypertension: Secondary | ICD-10-CM | POA: Insufficient documentation

## 2013-09-12 DIAGNOSIS — G8911 Acute pain due to trauma: Secondary | ICD-10-CM | POA: Insufficient documentation

## 2013-09-12 DIAGNOSIS — M25569 Pain in unspecified knee: Secondary | ICD-10-CM | POA: Insufficient documentation

## 2013-09-12 DIAGNOSIS — M25532 Pain in left wrist: Secondary | ICD-10-CM

## 2013-09-12 DIAGNOSIS — Z8669 Personal history of other diseases of the nervous system and sense organs: Secondary | ICD-10-CM | POA: Insufficient documentation

## 2013-09-12 DIAGNOSIS — M25539 Pain in unspecified wrist: Secondary | ICD-10-CM | POA: Insufficient documentation

## 2013-09-12 DIAGNOSIS — M25562 Pain in left knee: Secondary | ICD-10-CM

## 2013-09-12 DIAGNOSIS — G8929 Other chronic pain: Secondary | ICD-10-CM | POA: Insufficient documentation

## 2013-09-12 DIAGNOSIS — Z8659 Personal history of other mental and behavioral disorders: Secondary | ICD-10-CM | POA: Insufficient documentation

## 2013-09-12 DIAGNOSIS — J45909 Unspecified asthma, uncomplicated: Secondary | ICD-10-CM | POA: Insufficient documentation

## 2013-09-12 DIAGNOSIS — M25529 Pain in unspecified elbow: Secondary | ICD-10-CM | POA: Insufficient documentation

## 2013-09-12 NOTE — ED Provider Notes (Signed)
CSN: [id number]     Arrival date & time 09/12/13  1709 History  This chart was scribed for Flint MelterElliott L Lenville Hibberd, MD by Chestine SporeSoijett Blue, ED Scribe. The patient was seen in room APA19/APA19 at 7:32 PM.    Chief Complaint  Patient presents with  . Wrist Pain   The history is provided by the patient. No language interpreter was used.   HPI Comments: Bobby Bridges is a 38 y.o. male who presents to the Emergency Department complaining of constant left wrist pain and left elbow pain that occurred 2 weeks ago after he fell. He states that the incident occurred when he slipped and fell on mud while at work. [facility] for the wrist injury and was given a splint. Pt states that the splint doesn't help and he has since stopped wearing it. He states that he is not able to move the wrist without pain. Pt states that his elbow pain has improved some since the fall. He also reports a painful knot in his left knee. Pt states that he can hear popping while he is walking. He is not able to move the knee well without pain. Pt denies any other associated symptoms.   Past Medical History  Diagnosis Date  . Hypertension   . Anxiety   . Depression   . Asthma   . Seizures     outgrew by age 38  . Chronic back pain    History reviewed. No pertinent past surgical history. No family history on file. History  Substance Use Topics  . Smoking status: Former Smoker -- 1.00 packs/day for 2 years    Types: Cigarettes    Quit date: 04/08/2012  . Smokeless tobacco: Never Used  . Alcohol Use: No    Review of Systems  Constitutional: Negative for fever.  Musculoskeletal: Positive for arthralgias (Left wrist, left elbow, and left knee.).  Skin: Negative for wound.  Neurological: Negative for weakness and numbness.  All other systems reviewed and are negative.   Allergies  Review of patient's allergies indicates no known allergies.  Home Medications   Prior to Admission medications   Not on  File   BP 129/93  Pulse 94  Temp(Src) 98.2 F (36.8 C) (Oral)  Resp 16  Ht 5\' 11"  (1.803 m)  SpO2 98% Physical Exam  Nursing note and vitals reviewed. Constitutional: He is oriented to person, place, and time. He appears well-developed and well-nourished.  HENT:  Head: Normocephalic and atraumatic.  Right Ear: External ear normal.  Left Ear: External ear normal.  Eyes: Conjunctivae and EOM are normal. Pupils are equal, round, and reactive to light.  Neck: Normal range of motion and phonation normal. Neck supple.  Cardiovascular: Normal rate, regular rhythm, normal heart sounds and intact distal pulses.   Pulmonary/Chest: Effort normal and breath sounds normal. He exhibits no bony tenderness.  Abdominal: Soft. There is no tenderness.  Musculoskeletal: Normal range of motion.  Left wrist: pain over volar and dorsal aspects, more pain with flexion and extension.  Neurological: He is alert and oriented to person, place, and time. No cranial nerve deficit or sensory deficit. He exhibits normal muscle tone. Coordination normal.  Skin: Skin is warm, dry and intact.  Psychiatric: He has a normal mood and affect. His behavior is normal. Judgment and thought content normal.    ED Course  Procedures (including critical care time) DIAGNOSTIC STUDIES: Oxygen Saturation is 98% on room air, normal by my interpretation.  COORDINATION OF CARE: 7:38 PM-Discussed treatment plan which includes referral to a physical therapist and a knee sleeve. with pt at bedside and pt agreed to plan.   Medications - No data to display  No data found.     EKG Interpretation None      MDM   Final diagnoses:  Wrist pain, left  Knee pain, left    Nonspecific pain , left wrist, following injury. Left knee pain without trauma. No evidence for fracture, significant tenderness over localized arthritis.  Nursing Notes Reviewed/ Care Coordinated Applicable Imaging Reviewed Interpretation of Laboratory  Data incorporated into ED treatment  The patient appears reasonably screened and/or stabilized for discharge and I doubt any other medical condition or other [facility] requiring further screening, evaluation, or treatment in the ED at this time prior to discharge.  Plan: Home Medications- IBU; Home Treatments- rest; return here if the recommended treatment, does not improve the symptoms; Recommended follow up- Light duty 1 week, PCP prn  I personally performed the services described in this documentation, which was scribed in my presence. The recorded information has been reviewed and is accurate.     Flint Melter, MD 09/14/13 [phone]

## 2013-09-12 NOTE — ED Notes (Signed)
Complain of pain in left wrist from a fall two weeks ago. States he was given a black brace to wear but it is not helping.

## 2013-09-12 NOTE — Discharge Instructions (Signed)
Use heat on the sore areas, 3 or 4 times a day  Take ibuprofen 400 mg 3 times a day, as needed for pain   Arthralgia Arthralgia is joint pain. A joint is a place where two bones meet. Joint pain can happen for many reasons. The joint can be bruised, stiff, infected, or weak from aging. Pain usually goes away after resting and taking medicine for soreness.  HOME CARE  Rest the joint as told by your doctor.  Keep the sore joint raised (elevated) for the first 24 hours.  Put ice on the joint area.  Put ice in a plastic bag.  Place a towel between your skin and the bag.  Leave the ice on for 15-20 minutes, 03-04 times a day.  Wear your splint, casting, elastic bandage, or sling as told by your doctor.  Only take medicine as told by your doctor. Do not take aspirin.  Use crutches as told by your doctor. Do not put weight on the joint until told to by your doctor. GET HELP RIGHT AWAY IF:   You have bruising, puffiness (swelling), or more pain.  Your fingers or toes turn blue or start to lose feeling (numb).  Your medicine does not lessen the pain.  Your pain becomes severe.  You have a temperature by mouth above 102 F (38.9 C), not controlled by medicine.  You cannot move or use the joint. MAKE SURE YOU:   Understand these instructions.  Will watch your condition.  Will get help right away if you are not doing well or get worse. Document Released: 03/14/2009 Document Revised: 06/18/2011 Document Reviewed: 03/14/2009 [facility] Patient Information 2014 [place], [place].

## 2013-09-24 ENCOUNTER — Emergency Department (HOSPITAL_COMMUNITY)
Admission: EM | Admit: 2013-09-24 | Discharge: 2013-09-24 | Disposition: A | Discharge status: Home / Self Care | Payer: BC Managed Care – PPO | Attending: Emergency Medicine | Admitting: Emergency Medicine

## 2013-09-24 ENCOUNTER — Encounter (HOSPITAL_COMMUNITY): Payer: Self-pay | Admitting: Emergency Medicine

## 2013-09-24 ENCOUNTER — Emergency Department (HOSPITAL_COMMUNITY): Payer: BC Managed Care – PPO

## 2013-09-24 DIAGNOSIS — M25539 Pain in unspecified wrist: Secondary | ICD-10-CM | POA: Insufficient documentation

## 2013-09-24 DIAGNOSIS — Z87891 Personal history of nicotine dependence: Secondary | ICD-10-CM | POA: Insufficient documentation

## 2013-09-24 DIAGNOSIS — J45909 Unspecified asthma, uncomplicated: Secondary | ICD-10-CM | POA: Insufficient documentation

## 2013-09-24 DIAGNOSIS — I10 Essential (primary) hypertension: Secondary | ICD-10-CM | POA: Insufficient documentation

## 2013-09-24 DIAGNOSIS — G8929 Other chronic pain: Secondary | ICD-10-CM | POA: Insufficient documentation

## 2013-09-24 DIAGNOSIS — Z8659 Personal history of other mental and behavioral disorders: Secondary | ICD-10-CM | POA: Insufficient documentation

## 2013-09-24 DIAGNOSIS — M25569 Pain in unspecified knee: Secondary | ICD-10-CM | POA: Insufficient documentation

## 2013-09-24 MED ORDER — IBUPROFEN 800 MG PO TABS: 800.0000 mg | ORAL_TABLET | Freq: Three times a day (TID) | ORAL | Status: DC

## 2013-09-24 MED ORDER — KETOROLAC TROMETHAMINE 30 MG/ML IJ SOLN
30.0000 mg | Freq: Once | INTRAMUSCULAR | Status: AC
  Administered 2013-09-24: 30 mg via INTRAMUSCULAR
  Filled 2013-09-24: qty 1

## 2013-09-24 MED ORDER — HYDROCODONE-ACETAMINOPHEN 5-325 MG PO TABS: 1.0000 | ORAL_TABLET | Freq: Four times a day (QID) | ORAL | Status: DC | PRN

## 2013-09-24 NOTE — Discharge Instructions (Signed)
As discussed, your evaluation today has been largely reassuring.  But, it is important that you monitor your condition carefully, and do not hesitate to return to the ED if you develop new, or concerning changes in your condition.  You should use ice packs 4 times daily, 20 minutes each application, for the next week.  In addition to this, please take ibuprofen, 800 mg, 3 times daily.  Please use the provided medication for breakthrough pain.   Most importantly, please be sure to keep your appointment with orthopedist.

## 2013-09-24 NOTE — ED Notes (Signed)
Patient c/o left wrist pain. Per patient was seen here in ER 3 weeks ago after falling and injuring wrist. Patient reports wrist sprain in which he was giving a Velcro brace to use. Patient states that pain is worse with brace. Patient reports using tramadol and ibuprofen with no relief. Patient has appointment with Dr Romeo AppleHarrison in 2 weeks for wrist pain. Per patient left knee also hurts and has a "knot" from fall.

## 2013-09-24 NOTE — ED Provider Notes (Signed)
CSN: [id number]     Arrival date & time 09/24/13  1016 History  This chart was scribed for Gerhard Munchobert Lockwood, MD by Leone PayorSonum Patel, ED Scribe. This patient was seen in room APA12/APA12 and the patient's care was started 10:39 AM.    Chief Complaint  Patient presents with  . Wrist Pain      The history is provided by the patient. No language interpreter was used.    HPI Comments: Bobby Bridges is a 38 y.o. male who presents to the Emergency Department complaining of 1 month of constant, unchanged left wrist pain and left knee pain that began after a fall. He was seen in the ED after the event and was given a velcro wrist brace. He reports that he does not use the brace because it worsens his pain. He has taken tramadol and ibuprofen with mild relief. He also applies ice with mild relief. He denies numbness, weakness.   Past Medical History  Diagnosis Date  . Hypertension   . Anxiety   . Depression   . Asthma   . Seizures     outgrew by age 38  . Chronic back pain    History reviewed. No pertinent past surgical history. History reviewed. No pertinent family history. History  Substance Use Topics  . Smoking status: Former Smoker -- 1.00 packs/day for 2 years    Types: Cigarettes    Quit date: 04/08/2012  . Smokeless tobacco: Never Used  . Alcohol Use: No    Review of Systems  Constitutional:       Per HPI, otherwise negative  HENT:       Per HPI, otherwise negative  Respiratory:       Per HPI, otherwise negative  Cardiovascular:       Per HPI, otherwise negative  Gastrointestinal: Negative for vomiting.  Endocrine:       Negative aside from HPI  Genitourinary:       Neg aside from HPI   Musculoskeletal:       Per HPI, otherwise negative  Skin: Negative.   Neurological: Negative for syncope.      Allergies  Review of patient's allergies indicates no known allergies.  Home Medications   Prior to Admission medications   Not on File   BP 134/86  Pulse 93   Temp(Src) 98.1 F (36.7 C) (Oral)  Resp 22  Ht 5\' 11"  (1.803 m)  Wt 309 lb (140.161 kg)  BMI 43.12 kg/m2  SpO2 100% Physical Exam  Nursing note and vitals reviewed. Constitutional: He is oriented to person, place, and time. He appears well-developed. No distress.  HENT:  Head: Normocephalic and atraumatic.  Eyes: Conjunctivae and EOM are normal.  Cardiovascular: Normal rate, regular rhythm and normal heart sounds.   Pulmonary/Chest: Effort normal. No stridor. No respiratory distress.  Abdominal: He exhibits no distension.  Musculoskeletal: He exhibits tenderness. He exhibits no edema.  Tenderness to palpation of medial and lateral distal wrist without swelling. Pain with all ROM.   Neurological: He is alert and oriented to person, place, and time.  Skin: Skin is warm and dry.  Psychiatric: He has a normal mood and affect.    ED Course  Procedures (including critical care time)  DIAGNOSTIC STUDIES: Oxygen Saturation is 100% on RA, normal by my interpretation.    COORDINATION OF CARE: 10:53 AM Discussed treatment plan with pt at bedside and pt agreed to plan.  Imaging Review Dg Wrist Complete Left  09/24/2013   CLINICAL DATA:  Pain.  Trauma.  EXAM: LEFT WRIST - COMPLETE 3+ VIEW  COMPARISON:  None.  FINDINGS: There is no evidence of fracture or dislocation. There is no evidence of arthropathy or other focal bone abnormality. Soft tissues are unremarkable.  IMPRESSION: Negative.   Electronically Signed   By: Maisie Fushomas  Register   On: 09/24/2013 11:19   Ice applied  11:32 AM Are exam the patient appears calm.  I had a conversation with him about pain management and orthopedics followup.  MDM    I personally performed the services described in this documentation, which was scribed in my presence. The recorded information has been reviewed and is accurate.   Patient presents with concern ongoing pain in his wrist and knee.  Patient is neurovascularly intact, hemodynamically  stable, awake, alert, and in no distress. Patient's x-ray was reassuring.  With appropriate neurovascular status, patient is appropriate for discharge with further evaluation, management as an outpatient. Patient received additional analgesia here, was started on a new course of anti-inflammatories, analgesics  Gerhard Munchobert Lockwood, MD 09/24/13 1133

## 2013-10-13 IMAGING — CR DG CHEST 1V PORT
1 series · 1 of 1 positions shown · non-contrast
Comparison: Plain film chest 02/19/2011 and 02/04/2004.

CLINICAL DATA: Chest pain.

PORTABLE CHEST - 1 VIEW

[view not recorded]
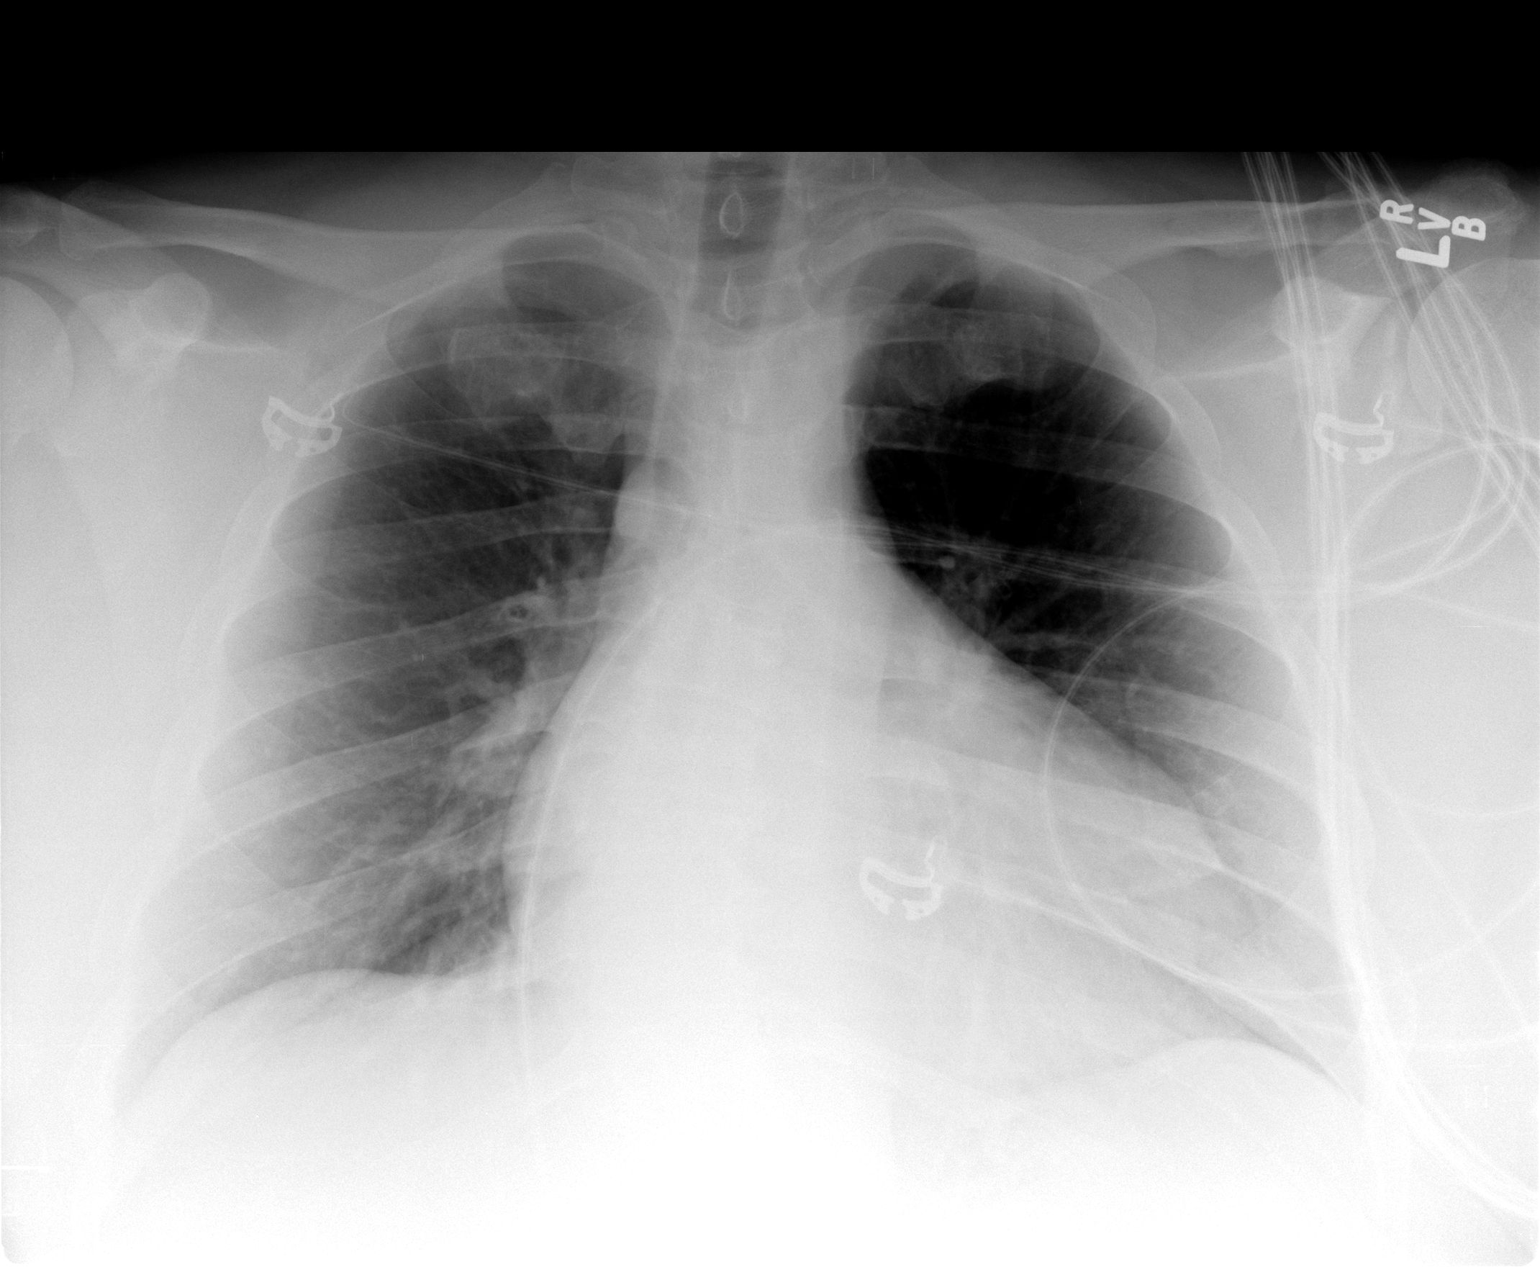

[1 of 1 positions shown; findings below may reference images not displayed]

FINDINGS: There is cardiomegaly.  Lungs are clear.  No pneumothorax
or pleural effusion.
IMPRESSION: Cardiomegaly without acute disease.

## 2013-10-23 ENCOUNTER — Emergency Department (HOSPITAL_COMMUNITY): Payer: BC Managed Care – PPO

## 2013-10-23 ENCOUNTER — Emergency Department (HOSPITAL_COMMUNITY)
Admission: EM | Admit: 2013-10-23 | Discharge: 2013-10-24 | Disposition: A | Discharge status: Home / Self Care | Payer: BC Managed Care – PPO | Attending: Emergency Medicine | Admitting: Emergency Medicine

## 2013-10-23 ENCOUNTER — Encounter (HOSPITAL_COMMUNITY): Payer: Self-pay | Admitting: Emergency Medicine

## 2013-10-23 DIAGNOSIS — I10 Essential (primary) hypertension: Secondary | ICD-10-CM | POA: Insufficient documentation

## 2013-10-23 DIAGNOSIS — R071 Chest pain on breathing: Secondary | ICD-10-CM | POA: Insufficient documentation

## 2013-10-23 DIAGNOSIS — Z8669 Personal history of other diseases of the nervous system and sense organs: Secondary | ICD-10-CM | POA: Insufficient documentation

## 2013-10-23 DIAGNOSIS — Z8659 Personal history of other mental and behavioral disorders: Secondary | ICD-10-CM | POA: Insufficient documentation

## 2013-10-23 DIAGNOSIS — J45901 Unspecified asthma with (acute) exacerbation: Secondary | ICD-10-CM | POA: Insufficient documentation

## 2013-10-23 DIAGNOSIS — R0789 Other chest pain: Secondary | ICD-10-CM

## 2013-10-23 DIAGNOSIS — Z791 Long term (current) use of non-steroidal anti-inflammatories (NSAID): Secondary | ICD-10-CM | POA: Insufficient documentation

## 2013-10-23 DIAGNOSIS — G8929 Other chronic pain: Secondary | ICD-10-CM | POA: Insufficient documentation

## 2013-10-23 DIAGNOSIS — R1110 Vomiting, unspecified: Secondary | ICD-10-CM | POA: Insufficient documentation

## 2013-10-23 DIAGNOSIS — Z87891 Personal history of nicotine dependence: Secondary | ICD-10-CM | POA: Insufficient documentation

## 2013-10-23 NOTE — ED Provider Notes (Signed)
CSN: [id number]     Arrival date & time 10/23/13  2334 History   First MD Initiated Contact with Patient 10/23/13 2354   This chart was scribed for Bobby Booze, MD by Bobby Bridges, ED scribe. This patient was seen in room APA04/APA04 and the patient's care was started at 11:57 PM.    Chief Complaint  Patient presents with  . Chest Pain   The history is provided by the patient. No language interpreter was used.   HPI Comments:  Bobby Bridges is a 38 y.o. male who presents to the Emergency Department complaining of sharp, severe, CP on the right side onset 3 hours ago, with associated symptoms of SOB. Pt reports that he had an episode of emesis, and sweating at 8:00PM. Pt reports that the pain began in his head, then he began to feel pain in his chest. 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 Pt reports a h/o of HT.Pt reports having trouble sleeping at night and that he has not had an appetite for the last day. Pt quit smoking and drinking 1 year ago. Pt denies SI or HI. No PCP.   Past Medical History  Diagnosis Date  . Hypertension   . Anxiety   . Depression   . Asthma   . Seizures     outgrew by age 82  . Chronic back pain    History reviewed. No pertinent past surgical history. No family history on file. History  Substance Use Topics  . Smoking status: Former Smoker -- 1.00 packs/day for 2 years    Types: Cigarettes    Quit date: 04/08/2012  . Smokeless tobacco: Never Used  . Alcohol Use: No    Review of Systems  Respiratory: Positive for shortness of breath.   Cardiovascular: Positive for chest pain.  Gastrointestinal: Positive for vomiting.  Neurological: Positive for headaches.  All other systems reviewed and are negative.     Allergies  Review of patient's allergies indicates no known allergies.  Home Medications   Prior to Admission medications   Medication Sig Start Date End Date  Taking? Authorizing Provider  ibuprofen (ADVIL,MOTRIN) 800 MG tablet Take 1 tablet (800 mg total) by mouth 3 (three) times daily. 09/24/13  Yes Gerhard Munch, MD  HYDROcodone-acetaminophen (NORCO/VICODIN) 5-325 MG per tablet Take 1 tablet by mouth every 6 (six) hours as needed for moderate pain or severe pain. 09/24/13   Gerhard Munch, MD  ibuprofen (ADVIL,MOTRIN) 200 MG tablet Take 800 mg by mouth every 8 (eight) hours as needed for moderate pain.    Historical Provider, MD   BP 121/76  Pulse 95  Temp(Src) 98.9 F (37.2 C) (Oral)  Resp 19  Ht 5\' 11"  (1.803 m)  Wt 309 lb (140.161 kg)  BMI 43.12 kg/m2  SpO2 100% Physical Exam  Musculoskeletal:  Mild tenderness right peristernal     ED Course  Procedures  DIAGNOSTIC STUDIES: Oxygen Saturation is 100% on RA, normal by my interpretation.  COORDINATION OF CARE: 12:03 AM-Discussed treatment plan with pt at bedside and pt agreed to plan. Results for orders placed during the hospital encounter of 10/23/13  TROPONIN I      Result Value Ref Range   Troponin I <0.30  <0.30 ng/mL  CBC      Result Value Ref Range   WBC 4.5  4.0 - 10.5 K/uL   RBC 5.06  4.22 - 5.81 MIL/uL  Hemoglobin 14.3  13.0 - 17.0 g/dL   HCT 16.141.6  09.639.0 - 04.552.0 %   MCV 82.2  78.0 - 100.0 fL   MCH 28.3  26.0 - 34.0 pg   MCHC 34.4  30.0 - 36.0 g/dL   RDW 40.912.7  81.111.5 - 91.415.5 %   Platelets 199  150 - 400 K/uL  BASIC METABOLIC PANEL      Result Value Ref Range   Sodium 140  137 - 147 mEq/L   Potassium 3.3 (*) 3.7 - 5.3 mEq/L   Chloride 106  96 - 112 mEq/L   CO2 22  19 - 32 mEq/L   Glucose, Bld 110 (*) 70 - 99 mg/dL   BUN 18  6 - 23 mg/dL   Creatinine, Ser 7.821.08  0.50 - 1.35 mg/dL   Calcium 8.6  8.4 - 95.610.5 mg/dL   GFR calc non Af Amer 87 (*) >90 mL/min   GFR calc Af Amer >90  >90 mL/min   Anion gap 12  5 - 15   Imaging Review Dg Chest Port 1 View  10/24/2013   CLINICAL DATA:  Chest pain and weakness.  EXAM: PORTABLE CHEST - 1 VIEW  COMPARISON:  Chest radiograph  performed 09/11/2012  FINDINGS: The lungs are well-aerated. Mild peribronchial thickening is noted. There is no evidence of focal opacification, pleural effusion or pneumothorax.  The cardiomediastinal silhouette is within normal limits. No acute osseous abnormalities are seen.  IMPRESSION: Mild peribronchial thickening noted; lungs otherwise clear.   Electronically Signed   By: Roanna RaiderJeffery  Chang M.D.   On: 10/24/2013 00:10     EKG Interpretation   Date/Time:  Friday October 23 2013 23:39:18 EDT Ventricular Rate:  98 PR Interval:  165 QRS Duration: 109 QT Interval:  350 QTC Calculation: 447 R Axis:   114 Text Interpretation:  Sinus rhythm Probable left atrial enlargement Right  axis deviation ST elev, probable normal early repol pattern When compared  with ECG of 09/02/2012, No significant change was found Confirmed by [facility]   MD, Jhada Risk ([id number]) on 10/24/2013 12:02:30 AM      MDM   Final diagnoses:  Chest wall pain    Chest pain which appears to be a chest wall pain. ECG is unchanged. You have a given a dose of ketorolac and reassessed.  He got good relief of pain with ketorolac. He is advised to use over-the-counter naproxen as needed for pain. He's also requesting something to help with sleep stating he has insomnia. He is given a prescription for zolpidem.  I personally performed the services described in this documentation, which was scribed in my presence. The recorded information has been reviewed and is accurate.       Bobby Boozeavid [name], MD 10/24/13 [phone]

## 2013-10-23 NOTE — ED Notes (Signed)
Per EMS, patient c/o right sided chest pain for several hours.  Patient told EMS that he has not eaten today.

## 2013-10-24 LAB — BASIC METABOLIC PANEL
Anion gap: 12 (ref 5–15)
BUN: 18 mg/dL (ref 6–23)
CHLORIDE: 106 meq/L (ref 96–112)
CO2: 22 meq/L (ref 19–32)
CREATININE: 1.08 mg/dL (ref 0.50–1.35)
Calcium: 8.6 mg/dL (ref 8.4–10.5)
GFR calc Af Amer: >90 mL/min (ref >90)
GFR calc non Af Amer: 87 mL/min — ABNORMAL LOW (ref >90)
GLUCOSE: 110 mg/dL — AB (ref 70–99)
Potassium: 3.3 mEq/L — ABNORMAL LOW (ref 3.7–5.3)
Sodium: 140 mEq/L (ref 137–147)

## 2013-10-24 LAB — TROPONIN I: Troponin I: <0.3 ng/mL (ref <0.30)

## 2013-10-24 LAB — CBC
HEMATOCRIT: 41.6 % (ref 39.0–52.0)
HEMOGLOBIN: 14.3 g/dL (ref 13.0–17.0)
MCH: 28.3 pg (ref 26.0–34.0)
MCHC: 34.4 g/dL (ref 30.0–36.0)
MCV: 82.2 fL (ref 78.0–100.0)
Platelets: 199 10*3/uL (ref 150–400)
RBC: 5.06 MIL/uL (ref 4.22–5.81)
RDW: 12.7 % (ref 11.5–15.5)
WBC: 4.5 10*3/uL (ref 4.0–10.5)

## 2013-10-24 MED ORDER — ASPIRIN 81 MG PO CHEW: 324.0000 mg | CHEWABLE_TABLET | Freq: Once | ORAL | Status: DC

## 2013-10-24 MED ORDER — ZOLPIDEM TARTRATE 5 MG PO TABS: 5.0000 mg | ORAL_TABLET | Freq: Every evening | ORAL | Status: DC | PRN

## 2013-10-24 MED ORDER — KETOROLAC TROMETHAMINE 30 MG/ML IJ SOLN
30.0000 mg | Freq: Once | INTRAMUSCULAR | Status: AC
  Administered 2013-10-24: 30 mg via INTRAVENOUS
  Filled 2013-10-24: qty 1

## 2013-10-24 NOTE — Discharge Instructions (Signed)
Take two Naproxen (Aleve) tablets at a time, twice a day.   Chest Wall Pain Chest wall pain is pain in or around the bones and muscles of your chest. It may take up to 6 weeks to get better. It may take longer if you must stay physically active in your work and activities.  CAUSES  Chest wall pain may happen on its own. However, it may be caused by:  A viral illness like the flu.  Injury.  Coughing.  Exercise.  Arthritis.  Fibromyalgia.  Shingles. HOME CARE INSTRUCTIONS   Avoid overtiring physical activity. Try not to strain or perform activities that cause pain. This includes any activities using your chest or your abdominal and side muscles, especially if heavy weights are used.  Put ice on the sore area.  Put ice in a plastic bag.  Place a towel between your skin and the bag.  Leave the ice on for 15-20 minutes per hour while awake for the first 2 days.  Only take over-the-counter or prescription medicines for pain, discomfort, or fever as directed by your caregiver. SEEK IMMEDIATE MEDICAL CARE IF:   Your pain increases, or you are very uncomfortable.  You have a fever.  Your chest pain becomes worse.  You have new, unexplained symptoms.  You have nausea or vomiting.  You feel sweaty or lightheaded.  You have a cough with phlegm (sputum), or you cough up blood. MAKE SURE YOU:   Understand these instructions.  Will watch your condition.  Will get help right away if you are not doing well or get worse. Document Released: 03/26/2005 Document Revised: 06/18/2011 Document Reviewed: 11/20/2010 [facility] Patient Information 2015 [place], [place]. This information is not intended to replace advice given to you by your health care provider. Make sure you discuss any questions you have with your health care provider.

## 2013-11-28 ENCOUNTER — Encounter (HOSPITAL_COMMUNITY): Payer: Self-pay | Admitting: Emergency Medicine

## 2013-11-28 ENCOUNTER — Emergency Department (HOSPITAL_COMMUNITY)
Admission: EM | Admit: 2013-11-28 | Discharge: 2013-11-28 | Disposition: A | Discharge status: Home / Self Care | Payer: BC Managed Care – PPO | Attending: Emergency Medicine | Admitting: Emergency Medicine

## 2013-11-28 DIAGNOSIS — I10 Essential (primary) hypertension: Secondary | ICD-10-CM | POA: Diagnosis not present

## 2013-11-28 DIAGNOSIS — Z87891 Personal history of nicotine dependence: Secondary | ICD-10-CM | POA: Diagnosis not present

## 2013-11-28 DIAGNOSIS — Z8659 Personal history of other mental and behavioral disorders: Secondary | ICD-10-CM | POA: Insufficient documentation

## 2013-11-28 DIAGNOSIS — R6883 Chills (without fever): Secondary | ICD-10-CM | POA: Diagnosis not present

## 2013-11-28 DIAGNOSIS — M546 Pain in thoracic spine: Secondary | ICD-10-CM | POA: Insufficient documentation

## 2013-11-28 DIAGNOSIS — M7989 Other specified soft tissue disorders: Secondary | ICD-10-CM | POA: Diagnosis not present

## 2013-11-28 DIAGNOSIS — IMO0001 Reserved for inherently not codable concepts without codable children: Secondary | ICD-10-CM | POA: Insufficient documentation

## 2013-11-28 DIAGNOSIS — R52 Pain, unspecified: Secondary | ICD-10-CM | POA: Insufficient documentation

## 2013-11-28 DIAGNOSIS — G8929 Other chronic pain: Secondary | ICD-10-CM | POA: Diagnosis not present

## 2013-11-28 DIAGNOSIS — M542 Cervicalgia: Secondary | ICD-10-CM | POA: Insufficient documentation

## 2013-11-28 DIAGNOSIS — J45909 Unspecified asthma, uncomplicated: Secondary | ICD-10-CM | POA: Insufficient documentation

## 2013-11-28 DIAGNOSIS — J029 Acute pharyngitis, unspecified: Secondary | ICD-10-CM | POA: Insufficient documentation

## 2013-11-28 DIAGNOSIS — M791 Myalgia, unspecified site: Secondary | ICD-10-CM

## 2013-11-28 LAB — COMPREHENSIVE METABOLIC PANEL
ALK PHOS: 88 U/L (ref 39–117)
ALT: 25 U/L (ref 0–53)
ANION GAP: 11 (ref 5–15)
AST: 24 U/L (ref 0–37)
Albumin: 3.8 g/dL (ref 3.5–5.2)
BILIRUBIN TOTAL: 0.5 mg/dL (ref 0.3–1.2)
BUN: 14 mg/dL (ref 6–23)
CHLORIDE: 104 meq/L (ref 96–112)
CO2: 25 mEq/L (ref 19–32)
Calcium: 9.1 mg/dL (ref 8.4–10.5)
Creatinine, Ser: 1 mg/dL (ref 0.50–1.35)
GFR calc non Af Amer: >90 mL/min (ref >90)
GLUCOSE: 95 mg/dL (ref 70–99)
Potassium: 3.7 mEq/L (ref 3.7–5.3)
Sodium: 140 mEq/L (ref 137–147)
TOTAL PROTEIN: 7.8 g/dL (ref 6.0–8.3)

## 2013-11-28 LAB — MONONUCLEOSIS SCREEN: Mono Screen: NEGATIVE

## 2013-11-28 LAB — CBC WITH DIFFERENTIAL/PLATELET
BASOS PCT: 0 % (ref 0–1)
Basophils Absolute: 0 10*3/uL (ref 0.0–0.1)
EOS ABS: 0.1 10*3/uL (ref 0.0–0.7)
Eosinophils Relative: 1 % (ref 0–5)
HEMATOCRIT: 45 % (ref 39.0–52.0)
Hemoglobin: 15.2 g/dL (ref 13.0–17.0)
Lymphocytes Relative: 35 % (ref 12–46)
Lymphs Abs: 1.8 10*3/uL (ref 0.7–4.0)
MCH: 28.1 pg (ref 26.0–34.0)
MCHC: 33.8 g/dL (ref 30.0–36.0)
MCV: 83.2 fL (ref 78.0–100.0)
MONOS PCT: 8 % (ref 3–12)
Monocytes Absolute: 0.4 10*3/uL (ref 0.1–1.0)
Neutro Abs: 2.9 10*3/uL (ref 1.7–7.7)
Neutrophils Relative %: 56 % (ref 43–77)
Platelets: 217 10*3/uL (ref 150–400)
RBC: 5.41 MIL/uL (ref 4.22–5.81)
RDW: 12.9 % (ref 11.5–15.5)
WBC: 5.2 10*3/uL (ref 4.0–10.5)

## 2013-11-28 LAB — URINALYSIS, ROUTINE W REFLEX MICROSCOPIC
BILIRUBIN URINE: NEGATIVE
Glucose, UA: NEGATIVE mg/dL
Hgb urine dipstick: NEGATIVE
KETONES UR: NEGATIVE mg/dL
Leukocytes, UA: NEGATIVE
Nitrite: NEGATIVE
Protein, ur: NEGATIVE mg/dL
Specific Gravity, Urine: >1.03 — ABNORMAL HIGH (ref 1.005–1.030)
UROBILINOGEN UA: 0.2 mg/dL (ref 0.0–1.0)
pH: 6 (ref 5.0–8.0)

## 2013-11-28 LAB — CK: Total CK: 357 U/L — ABNORMAL HIGH (ref 7–232)

## 2013-11-28 MED ORDER — KETOROLAC TROMETHAMINE 60 MG/2ML IM SOLN
60.0000 mg | Freq: Once | INTRAMUSCULAR | Status: AC
  Administered 2013-11-28: 60 mg via INTRAMUSCULAR
  Filled 2013-11-28: qty 2

## 2013-11-28 MED ORDER — HYDROCODONE-ACETAMINOPHEN 5-325 MG PO TABS: 1.0000 | ORAL_TABLET | ORAL | Status: DC | PRN

## 2013-11-28 MED ORDER — CYCLOBENZAPRINE HCL 10 MG PO TABS: 10.0000 mg | ORAL_TABLET | Freq: Two times a day (BID) | ORAL | Status: DC | PRN

## 2013-11-28 MED ORDER — NAPROXEN 375 MG PO TABS: 375.0000 mg | ORAL_TABLET | Freq: Two times a day (BID) | ORAL | Status: DC

## 2013-11-28 NOTE — ED Provider Notes (Signed)
CSN: [id number]     Arrival date & time 11/28/13  1191 History   First MD Initiated Contact with Patient 11/28/13 [phone]     Chief Complaint  Patient presents with  . Generalized Body Aches     (Consider location/radiation/quality/duration/timing/severity/associated sxs/prior Treatment) The history is provided by the patient.   Bobby Bridges is a 38 y.o. male who presents to the ED with generalized body aches. He states that his symptoms started 2 weeks ago and have gotten progressively worse. The pain started in his lower back and he thought it was from pulling heavy stuff at work at [employer]. But now the pain is all over the body and hurts to walk or move. He has had chills but no fever. He denies UTI symptoms. No known tick bites or other insect bites. He denies n/v or other problems.  Past Medical History  Diagnosis Date  . Hypertension   . Anxiety   . Depression   . Asthma   . Seizures     outgrew by age 34  . Chronic back pain    History reviewed. No pertinent past surgical history. No family history on file. History  Substance Use Topics  . Smoking status: Former Smoker -- 1.00 packs/day for 2 years    Types: Cigarettes    Quit date: 04/08/2012  . Smokeless tobacco: Never Used  . Alcohol Use: No    Review of Systems  Constitutional: Positive for chills. Negative for fever.  HENT: Positive for sore throat.   Eyes: Negative for visual disturbance.  Respiratory: Negative for cough, chest tightness and shortness of breath.   Cardiovascular: Positive for leg swelling. Negative for chest pain.  Gastrointestinal: Negative for nausea (no nausea at this time), vomiting and abdominal pain.  Musculoskeletal: Positive for back pain, joint swelling, myalgias and neck pain. Negative for neck stiffness.  Skin: Negative for rash.  Neurological: Negative for dizziness, seizures, syncope and numbness. Headaches: comes and goes.  Psychiatric/Behavioral: Negative for confusion.  The patient is not nervous/anxious.       Allergies  Review of patient's allergies indicates no known allergies.  Home Medications   Prior to Admission medications   Medication Sig Start Date End Date Taking? Authorizing Provider  HYDROcodone-acetaminophen (NORCO/VICODIN) 5-325 MG per tablet Take 1 tablet by mouth every 6 (six) hours as needed for moderate pain or severe pain. 09/24/13  Yes Gerhard Munch, MD  zolpidem (AMBIEN) 5 MG tablet Take 1 tablet (5 mg total) by mouth at bedtime as needed for sleep. 10/24/13  Yes [name], MD   BP 118/82  Pulse 77  Temp(Src) 97.6 F (36.4 C) (Oral)  Resp 20  Wt 308 lb 7 oz (139.906 kg)  SpO2 98% Physical Exam  Nursing note and vitals reviewed. Constitutional: He is oriented to person, place, and time. He appears well-developed and well-nourished.  HENT:  Head: Normocephalic and atraumatic.  Right Ear: Tympanic membrane normal.  Left Ear: Tympanic membrane normal.  Nose: Nose normal.  Mouth/Throat: Uvula is midline, oropharynx is clear and moist and mucous membranes are normal.  Eyes: Conjunctivae are normal.  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 Neck supple. No JVD present. Muscular tenderness present. No spinous process tenderness present.  Cardiovascular: Normal rate, regular rhythm and normal heart sounds.   Pulmonary/Chest: Effort normal and breath sounds normal.  Abdominal: Soft. Bowel sounds are normal. There is no tenderness.  Musculoskeletal:  Lumbar back: He exhibits spasm. He exhibits normal range of motion.  Complains with pain with range of motion. Tender with palpation over lower lumbar area and thoracic area. He exhibits muscle spasm of the lower back.    Neurological: He is alert and oriented to person, place, and time. He has normal strength and normal reflexes. No cranial nerve deficit or sensory deficit. Gait normal.  Pedal pulses equal, adequate  circulation. Straight leg raises without difficulty.   Skin: Skin is warm and dry.  Psychiatric: He has a normal mood and affect. His behavior is normal.   Results for orders placed during the hospital encounter of 11/28/13 (from the past 24 hour(s))  CBC WITH DIFFERENTIAL     Status: None   Collection Time    11/28/13 10:21 AM      Result Value Ref Range   WBC 5.2  4.0 - 10.5 K/uL   RBC 5.41  4.22 - 5.81 MIL/uL   Hemoglobin 15.2  13.0 - 17.0 g/dL   HCT 16.1  09.6 - 04.5 %   MCV 83.2  78.0 - 100.0 fL   MCH 28.1  26.0 - 34.0 pg   MCHC 33.8  30.0 - 36.0 g/dL   RDW 40.9  81.1 - 91.4 %   Platelets 217  150 - 400 K/uL   Neutrophils Relative % 56  43 - 77 %   Neutro Abs 2.9  1.7 - 7.7 K/uL   Lymphocytes Relative 35  12 - 46 %   Lymphs Abs 1.8  0.7 - 4.0 K/uL   Monocytes Relative 8  3 - 12 %   Monocytes Absolute 0.4  0.1 - 1.0 K/uL   Eosinophils Relative 1  0 - 5 %   Eosinophils Absolute 0.1  0.0 - 0.7 K/uL   Basophils Relative 0  0 - 1 %   Basophils Absolute 0.0  0.0 - 0.1 K/uL  COMPREHENSIVE METABOLIC PANEL     Status: None   Collection Time    11/28/13 10:21 AM      Result Value Ref Range   Sodium 140  137 - 147 mEq/L   Potassium 3.7  3.7 - 5.3 mEq/L   Chloride 104  96 - 112 mEq/L   CO2 25  19 - 32 mEq/L   Glucose, Bld 95  70 - 99 mg/dL   BUN 14  6 - 23 mg/dL   Creatinine, Ser 7.82  0.50 - 1.35 mg/dL   Calcium 9.1  8.4 - 95.6 mg/dL   Total Protein 7.8  6.0 - 8.3 g/dL   Albumin 3.8  3.5 - 5.2 g/dL   AST 24  0 - 37 U/L   ALT 25  0 - 53 U/L   Alkaline Phosphatase 88  39 - 117 U/L   Total Bilirubin 0.5  0.3 - 1.2 mg/dL   GFR calc non Af Amer >90  >90 mL/min   GFR calc Af Amer >90  >90 mL/min   Anion gap 11  5 - 15  MONONUCLEOSIS SCREEN     Status: None   Collection Time    11/28/13 10:21 AM      Result Value Ref Range   Mono Screen NEGATIVE  NEGATIVE  URINALYSIS, ROUTINE W REFLEX MICROSCOPIC     Status: Abnormal   Collection Time    11/28/13 10:34 AM      Result Value  Ref Range   Color, Urine YELLOW  YELLOW   APPearance CLEAR  CLEAR  Specific Gravity, Urine >1.030 (*) 1.005 - 1.030   pH 6.0  5.0 - 8.0   Glucose, UA NEGATIVE  NEGATIVE mg/dL   Hgb urine dipstick NEGATIVE  NEGATIVE   Bilirubin Urine NEGATIVE  NEGATIVE   Ketones, ur NEGATIVE  NEGATIVE mg/dL   Protein, ur NEGATIVE  NEGATIVE mg/dL   Urobilinogen, UA 0.2  0.0 - 1.0 mg/dL   Nitrite NEGATIVE  NEGATIVE   Leukocytes, UA NEGATIVE  NEGATIVE    ED Course  Procedures MDM  38 y.o. male with low back pain and achy feeling that started after doing a lot of lifting at work. He request a work note. Will treat for pain and muscle spasm. He will follow up with ortho is symptoms persist. He will return here as needed for any problems. Discussed with the patient and all questioned fully answered.    Medication List    TAKE these medications       cyclobenzaprine 10 MG tablet  Commonly known as:  FLEXERIL  Take 1 tablet (10 mg total) by mouth 2 (two) times daily as needed for muscle spasms.     naproxen 375 MG tablet  Commonly known as:  NAPROSYN  Take 1 tablet (375 mg total) by mouth 2 (two) times daily.      ASK your doctor about these medications       HYDROcodone-acetaminophen 5-325 MG per tablet  Commonly known as:  NORCO/VICODIN  Take 1 tablet by mouth every 6 (six) hours as needed for moderate pain or severe pain.  Ask about: Which instructions should I use?     HYDROcodone-acetaminophen 5-325 MG per tablet  Commonly known as:  NORCO/VICODIN  Take 1 tablet by mouth every 4 (four) hours as needed.  Ask about: Which instructions should I use?     zolpidem 5 MG tablet  Commonly known as:  AMBIEN  Take 1 tablet (5 mg total) by mouth at bedtime as needed for sleep.          [address]Hope TescottM Neese, TexasNP 11/29/13 [phone]

## 2013-11-28 NOTE — ED Notes (Signed)
Pt c/o generalized body aches, nausea that started two days ago, denies any recent tick or spider bites, denies any fever, diarrhea,

## 2013-11-28 NOTE — Discharge Instructions (Signed)
Back Pain, Adult Low back pain is very common. About 1 in 5 people have back pain.The cause of low back pain is rarely dangerous. The pain often gets better over time.About half of people with a sudden onset of back pain feel better in just 2 weeks. About 8 in 10 people feel better by 6 weeks.  CAUSES Some common causes of back pain include:  Strain of the muscles or ligaments supporting the spine.  Wear and tear (degeneration) of the spinal discs.  Arthritis.  Direct injury to the back. DIAGNOSIS Most of the time, the direct cause of low back pain is not known.However, back pain can be treated effectively even when the exact cause of the pain is unknown.Answering your caregiver's questions about your overall health and symptoms is one of the most accurate ways to make sure the cause of your pain is not dangerous. If your caregiver needs more information, he or she may order lab work or imaging tests (X-rays or MRIs).However, even if imaging tests show changes in your back, this usually does not require surgery. HOME CARE INSTRUCTIONS For many people, back pain returns.Since low back pain is rarely dangerous, it is often a condition that people can learn to manageon their own.   Remain active. It is stressful on the back to sit or stand in one place. Do not sit, drive, or stand in one place for more than 30 minutes at a time. Take short walks on level surfaces as soon as pain allows.Try to increase the length of time you walk each day.  Do not stay in bed.Resting more than 1 or 2 days can delay your recovery.  Do not avoid exercise or work.Your body is made to move.It is not dangerous to be active, even though your back may hurt.Your back will likely heal faster if you return to being active before your pain is gone.  Pay attention to your body when you bend and lift. Many people have less discomfortwhen lifting if they bend their knees, keep the load close to their bodies,and  avoid twisting. Often, the most comfortable positions are those that put less stress on your recovering back.  Find a comfortable position to sleep. Use a firm mattress and lie on your side with your knees slightly bent. If you lie on your back, put a pillow under your knees.  Only take over-the-counter or prescription medicines as directed by your caregiver. Over-the-counter medicines to reduce pain and inflammation are often the most helpful.Your caregiver may prescribe muscle relaxant drugs.These medicines help dull your pain so you can more quickly return to your normal activities and healthy exercise.  Put ice on the injured area.  Put ice in a plastic bag.  Place a towel between your skin and the bag.  Leave the ice on for 15-20 minutes, 03-04 times a day for the first 2 to 3 days. After that, ice and heat may be alternated to reduce pain and spasms.  Ask your caregiver about trying back exercises and gentle massage. This may be of some benefit.  Avoid feeling anxious or stressed.Stress increases muscle tension and can worsen back pain.It is important to recognize when you are anxious or stressed and learn ways to manage it.Exercise is a great option. SEEK MEDICAL CARE IF:  You have pain that is not relieved with rest or medicine.  You have pain that does not improve in 1 week.  You have new symptoms.  You are generally not feeling well. SEEK   IMMEDIATE MEDICAL CARE IF:   You have pain that radiates from your back into your legs.  You develop new bowel or bladder control problems.  You have unusual weakness or numbness in your arms or legs.  You develop nausea or vomiting.  You develop abdominal pain.  You feel faint. Document Released: 03/26/2005 Document Revised: 09/25/2011 Document Reviewed: 07/28/2013 [employer] Patient Information 2015 [employer]. This information is not intended to replace advice given to you by your health care provider. Make sure you  discuss any questions you have with your health care provider.  

## 2013-11-29 NOTE — ED Provider Notes (Signed)
I saw and evaluated the patient, reviewed the resident's note and I agree with the findings and plan.   EKG Interpretation None       Juliet Rude. [name], MD 11/29/13 [phone]

## 2013-12-15 ENCOUNTER — Emergency Department (HOSPITAL_COMMUNITY)
Admission: EM | Admit: 2013-12-15 | Discharge: 2013-12-16 | Disposition: A | Discharge status: Home / Self Care | Payer: BC Managed Care – PPO | Attending: Emergency Medicine | Admitting: Emergency Medicine

## 2013-12-15 ENCOUNTER — Encounter (HOSPITAL_COMMUNITY): Payer: Self-pay | Admitting: Emergency Medicine

## 2013-12-15 DIAGNOSIS — F329 Major depressive disorder, single episode, unspecified: Secondary | ICD-10-CM | POA: Diagnosis present

## 2013-12-15 DIAGNOSIS — G8929 Other chronic pain: Secondary | ICD-10-CM | POA: Diagnosis not present

## 2013-12-15 DIAGNOSIS — F330 Major depressive disorder, recurrent, mild: Secondary | ICD-10-CM | POA: Insufficient documentation

## 2013-12-15 DIAGNOSIS — Z79899 Other long term (current) drug therapy: Secondary | ICD-10-CM | POA: Diagnosis not present

## 2013-12-15 DIAGNOSIS — M549 Dorsalgia, unspecified: Secondary | ICD-10-CM | POA: Diagnosis not present

## 2013-12-15 DIAGNOSIS — J45909 Unspecified asthma, uncomplicated: Secondary | ICD-10-CM | POA: Insufficient documentation

## 2013-12-15 DIAGNOSIS — F3289 Other specified depressive episodes: Secondary | ICD-10-CM | POA: Insufficient documentation

## 2013-12-15 DIAGNOSIS — F411 Generalized anxiety disorder: Secondary | ICD-10-CM | POA: Diagnosis not present

## 2013-12-15 DIAGNOSIS — G479 Sleep disorder, unspecified: Secondary | ICD-10-CM | POA: Diagnosis not present

## 2013-12-15 DIAGNOSIS — Z791 Long term (current) use of non-steroidal anti-inflammatories (NSAID): Secondary | ICD-10-CM | POA: Insufficient documentation

## 2013-12-15 DIAGNOSIS — Z87891 Personal history of nicotine dependence: Secondary | ICD-10-CM | POA: Diagnosis not present

## 2013-12-15 DIAGNOSIS — I10 Essential (primary) hypertension: Secondary | ICD-10-CM | POA: Insufficient documentation

## 2013-12-15 DIAGNOSIS — R209 Unspecified disturbances of skin sensation: Secondary | ICD-10-CM | POA: Diagnosis not present

## 2013-12-15 DIAGNOSIS — G40909 Epilepsy, unspecified, not intractable, without status epilepticus: Secondary | ICD-10-CM | POA: Insufficient documentation

## 2013-12-15 MED ORDER — NAPROXEN 500 MG PO TABS: 500.0000 mg | ORAL_TABLET | Freq: Two times a day (BID) | ORAL | Status: DC

## 2013-12-15 MED ORDER — ORPHENADRINE CITRATE ER 100 MG PO TB12: 100.0000 mg | ORAL_TABLET | Freq: Two times a day (BID) | ORAL | Status: DC

## 2013-12-15 NOTE — ED Provider Notes (Signed)
CSN: [id number]     Arrival date & time 12/15/13  2156 History  This chart was scribed for Bobby Booze, MD by Bronson Curb, ED Scribe. This patient was seen in room APA19/APA19 and the patient's care was started at 11:34 PM.    Chief Complaint  Patient presents with  . Depression      The history is provided by the patient. No language interpreter was used.    HPI Comments: Bobby Bridges is a 38 y.o. Male, with history of depression and anxiety, who presents to the Emergency Department complaining of depression for the last 3-4 weeks. Patient states he was at the point he was going to hurt himself, but states this was related to anger and denies an SI. Patient states he has spoke with his bishop, and this has calmed him down. There is associated trouble sleeping, crying spells, and decreased energy. He denies HI. Patient reports he has taken citalopram for his depression in the past. He states his next appointment is at [facility] on September 24th, but will try to get schedules sooner. Patient also has history of HTN and chronic back pain.  Patient also complains of chronic lower back pain that has gotten worse in the last month. There is associated numbness/tingling of his fingers and continuous dripping after urinating.  Past Medical History  Diagnosis Date  . Hypertension   . Anxiety   . Depression   . Asthma   . Seizures     outgrew by age 80  . Chronic back pain    History reviewed. No pertinent past surgical history. History reviewed. No pertinent family history. History  Substance Use Topics  . Smoking status: Former Smoker -- 1.00 packs/day for 2 years    Types: Cigarettes    Quit date: 04/08/2012  . Smokeless tobacco: Never Used  . Alcohol Use: No    Review of Systems  Constitutional: Negative for fever.  Musculoskeletal: Positive for back pain.  Neurological: Positive for numbness.  Psychiatric/Behavioral: Positive for sleep disturbance. Negative for  suicidal ideas and self-injury.  All other systems reviewed and are negative.     Allergies  Review of patient's allergies indicates no known allergies.  Home Medications   Prior to Admission medications   Medication Sig Start Date End Date Taking? Authorizing Provider  cyclobenzaprine (FLEXERIL) 10 MG tablet Take 1 tablet (10 mg total) by mouth 2 (two) times daily as needed for muscle spasms. 11/28/13   Hope Orlene Och, NP  HYDROcodone-acetaminophen (NORCO/VICODIN) 5-325 MG per tablet Take 1 tablet by mouth every 6 (six) hours as needed for moderate pain or severe pain. 09/24/13   Gerhard Munch, MD  HYDROcodone-acetaminophen (NORCO/VICODIN) 5-325 MG per tablet Take 1 tablet by mouth every 4 (four) hours as needed. 11/28/13   Hope Orlene Och, NP  naproxen (NAPROSYN) 375 MG tablet Take 1 tablet (375 mg total) by mouth 2 (two) times daily. 11/28/13   Hope Orlene Och, NP  zolpidem (AMBIEN) 5 MG tablet Take 1 tablet (5 mg total) by mouth at bedtime as needed for sleep. 10/24/13   Bobby Booze, MD   Triage Vitals: BP 115/82  Pulse 88  Temp(Src) 98.8 F (37.1 C) (Oral)  Resp 18  Ht  (1.803 m)  Wt 303 lb (137.44 kg)  BMI 42.28 kg/m2  SpO2 100%  Physical Exam  Nursing note and vitals reviewed. Constitutional: He is oriented to person, place, and time. He appears well-developed and well-nourished. No distress.  HENT:  Head:  Normocephalic and atraumatic.  Eyes: Conjunctivae are normal.  Right eye gazes disconjugately with right eye deviating laterally and superiorly.  Neck: Neck supple. No tracheal deviation present.  Cardiovascular: Normal rate.   Pulmonary/Chest: Effort normal. No respiratory distress.  Musculoskeletal: Normal range of motion.  Mild tenderness diffusely though thoracic and lumbar spine.  Neurological: He is alert and oriented to person, place, and time.  Skin: Skin is warm and dry.  Psychiatric: He has a normal mood and affect. His behavior is normal.    ED Course   Procedures (including critical care time)  DIAGNOSTIC STUDIES: Oxygen Saturation is 100% on room air, normal by my interpretation.    COORDINATION OF CARE: At 2339 Discussed treatment plan with patient which includes Naprosyn and Norflex. Patient agrees.   MDM   Final diagnoses:  Major depressive disorder, recurrent episode, mild  Chronic back pain    Depression but without active suicidal ideation. Patient appears to have a good support system in place. He has scheduled followup with [facility]. He is a discharge and is instructed to call [facility] to see if his follow up appointment can be made sooner. He is to return to the ED should he develop any suicidal thoughts.  I personally performed the services described in this documentation, which was scribed in my presence. The recorded information has been reviewed and is accurate.  Bobby Booze, MD 12/16/13 [phone]

## 2013-12-15 NOTE — ED Notes (Signed)
Pt stated during conversation with nurse that he was not SI/HI and that he had a conversation with his pastor that helped him calm down with any angry feelings he had on admission. Updated charge nurse on this assessment.

## 2013-12-15 NOTE — ED Notes (Signed)
Called x 1 with no response.

## 2013-12-15 NOTE — ED Notes (Signed)
Pt admits to " a lot of pain" to back and legs, also c/o depression, states he had a plan to hurt self but has calmed down a lot since and denies at this time, denies HI

## 2013-12-15 NOTE — Discharge Instructions (Signed)
Call [facility] to try to get seen in the next week. Return to the ED if you start to have suicidal thoughts.  Depression Depression refers to feeling sad, low, down in the dumps, blue, gloomy, or empty. In general, there are two kinds of depression: 1. Normal sadness or normal grief. This kind of depression is one that we all feel from time to time after upsetting life experiences, such as the loss of a job or the ending of a relationship. This kind of depression is considered normal, is short lived, and resolves within a few days to 2 weeks. Depression experienced after the loss of a loved one (bereavement) often lasts longer than 2 weeks but normally gets better with time. 2. Clinical depression. This kind of depression lasts longer than normal sadness or normal grief or interferes with your ability to function at home, at work, and in school. It also interferes with your personal relationships. It affects almost every aspect of your life. Clinical depression is an illness. Symptoms of depression can also be caused by conditions other than those mentioned above, such as:  Physical illness. Some physical illnesses, including underactive thyroid gland (hypothyroidism), severe anemia, specific types of cancer, diabetes, uncontrolled seizures, heart and lung problems, strokes, and chronic pain are commonly associated with symptoms of depression.  Side effects of some prescription medicine. In some people, certain types of medicine can cause symptoms of depression.  Substance abuse. Abuse of alcohol and illicit drugs can cause symptoms of depression. SYMPTOMS Symptoms of normal sadness and normal grief include the following:  Feeling sad or crying for short periods of time.  Not caring about anything (apathy).  Difficulty sleeping or sleeping too much.  No longer able to enjoy the things you used to enjoy.  Desire to be by oneself all the time (social isolation).  Lack of energy or  motivation.  Difficulty concentrating or remembering.  Change in appetite or weight.  Restlessness or agitation. Symptoms of clinical depression include the same symptoms of normal sadness or normal grief and also the following symptoms:  Feeling sad or crying all the time.  Feelings of guilt or worthlessness.  Feelings of hopelessness or helplessness.  Thoughts of suicide or the desire to harm yourself (suicidal ideation).  Loss of touch with reality (psychotic symptoms). Seeing or hearing things that are not real (hallucinations) or having false beliefs about your life or the people around you (delusions and paranoia). DIAGNOSIS  The diagnosis of clinical depression is usually based on how bad the symptoms are and how long they have lasted. Your health care provider will also ask you questions about your medical history and substance use to find out if physical illness, use of prescription medicine, or substance abuse is causing your depression. Your health care provider may also order blood tests. TREATMENT  Often, normal sadness and normal grief do not require treatment. However, sometimes antidepressant medicine is given for bereavement to ease the depressive symptoms until they resolve. The treatment for clinical depression depends on how bad the symptoms are but often includes antidepressant medicine, counseling with a mental health professional, or both. Your health care provider will help to determine what treatment is best for you. Depression caused by physical illness usually goes away with appropriate medical treatment of the illness. If prescription medicine is causing depression, talk with your health care provider about stopping the medicine, decreasing the dose, or changing to another medicine. Depression caused by the abuse of alcohol or illicit drugs goes  away when you stop using these substances. Some adults need professional help in order to stop drinking or using  drugs. SEEK IMMEDIATE MEDICAL CARE IF:  You have thoughts about hurting yourself or others.  You lose touch with reality (have psychotic symptoms).  You are taking medicine for depression and have a serious side effect. FOR MORE INFORMATION  National Alliance on Mental Illness: www.nami.[employer] of Mental Health: http://www.maynard.net/ Document Released: 03/23/2000 Document Revised: 08/10/2013 Document Reviewed: 06/25/2011 [facility] Patient Information 2015 [place], [place]. This information is not intended to replace advice given to you by your health care provider. Make sure you discuss any questions you have with your health care provider.  Back Pain, Adult Low back pain is very common. About 1 in 5 people have back pain.The cause of low back pain is rarely dangerous. The pain often gets better over time.About half of people with a sudden onset of back pain feel better in just 2 weeks. About 8 in 10 people feel better by 6 weeks.  CAUSES Some common causes of back pain include:  Strain of the muscles or ligaments supporting the spine.  Wear and tear (degeneration) of the spinal discs.  Arthritis.  Direct injury to the back. DIAGNOSIS Most of the time, the direct cause of low back pain is not known.However, back pain can be treated effectively even when the exact cause of the pain is unknown.Answering your caregiver's questions about your overall health and symptoms is one of the most accurate ways to make sure the cause of your pain is not dangerous. If your caregiver needs more information, he or she may order lab work or imaging tests (X-rays or MRIs).However, even if imaging tests show changes in your back, this usually does not require surgery. HOME CARE INSTRUCTIONS For many people, back pain returns.Since low back pain is rarely dangerous, it is often a condition that people can learn to [facility] their own.   Remain active. It is stressful on the back to sit  or stand in one place. Do not sit, drive, or stand in one place for more than 30 minutes at a time. Take short walks on level surfaces as soon as pain allows.Try to increase the length of time you walk each day.  Do not stay in bed.Resting more than 1 or 2 days can delay your recovery.  Do not avoid exercise or work.Your body is made to move.It is not dangerous to be active, even though your back may hurt.Your back will likely heal faster if you return to being active before your pain is gone.  Pay attention to your body when you bend and lift. Many people have less discomfortwhen lifting if they bend their knees, keep the load close to their bodies,and avoid twisting. Often, the most comfortable positions are those that put less stress on your recovering back.  Find a comfortable position to sleep. Use a firm mattress and lie on your side with your knees slightly bent. If you lie on your back, put a pillow under your knees.  Only take over-the-counter or prescription medicines as directed by your caregiver. Over-the-counter medicines to reduce pain and inflammation are often the most helpful.Your caregiver may prescribe muscle relaxant drugs.These medicines help dull your pain so you can more quickly return to your normal activities and healthy exercise.  Put ice on the injured area.  Put ice in a plastic bag.  Place a towel between your skin and the bag.  Leave the ice  on for 15-20 minutes, 03-04 times a day for the first 2 to 3 days. After that, ice and heat may be alternated to reduce pain and spasms.  Ask your caregiver about trying back exercises and gentle massage. This may be of some benefit.  Avoid feeling anxious or stressed.Stress increases muscle tension and can worsen back pain.It is important to recognize when you are anxious or stressed and learn ways to manage it.Exercise is a great option. SEEK MEDICAL CARE IF:  You have pain that is not relieved with rest or  medicine.  You have pain that does not improve in 1 week.  You have new symptoms.  You are generally not feeling well. SEEK IMMEDIATE MEDICAL CARE IF:   You have pain that radiates from your back into your legs.  You develop new bowel or bladder control problems.  You have unusual weakness or numbness in your arms or legs.  You develop nausea or vomiting.  You develop abdominal pain.  You feel faint. Document Released: 03/26/2005 Document Revised: 09/25/2011 Document Reviewed: 07/28/2013 [facility] Patient Information 2015 [place], [place]. This information is not intended to replace advice given to you by your health care provider. Make sure you discuss any questions you have with your health care provider.  Naproxen and naproxen sodium oral immediate-release tablets What is this medicine? NAPROXEN (na PROX en) is a non-steroidal anti-inflammatory drug (NSAID). It is used to reduce swelling and to treat pain. This medicine may be used for dental pain, headache, or painful monthly periods. It is also used for painful joint and muscular problems such as arthritis, tendinitis, bursitis, and gout. This medicine may be used for other purposes; ask your health care provider or pharmacist if you have questions. COMMON BRAND NAME(S): Aflaxen, Aleve, Aleve Arthritis, All Day Relief, Anaprox, Anaprox DS, Naprosyn What should I tell my health care provider before I take this medicine? They need to know if you have any of these conditions: -asthma -cigarette smoker -drink more than 3 alcohol containing drinks a day -heart disease or circulation problems such as heart failure or leg edema (fluid retention) -high blood pressure -kidney disease -liver disease -stomach bleeding or ulcers -an unusual or allergic reaction to naproxen, aspirin, other NSAIDs, other medicines, foods, dyes, or preservatives -pregnant or trying to get pregnant -breast-feeding How should I use this medicine? Take  this medicine by mouth with a glass of water. Follow the directions on the prescription label. Take it with food if your stomach gets upset. Try to not lie down for at least 10 minutes after you take it. Take your medicine at regular intervals. Do not take your medicine more often than directed. Long-term, continuous use may increase the risk of heart attack or stroke. A special MedGuide will be given to you by the pharmacist with each prescription and refill. Be sure to read this information carefully each time. Talk to your pediatrician regarding the use of this medicine in children. Special care may be needed. Overdosage: If you think you have taken too much of this medicine contact a poison control center or emergency room at once. NOTE: This medicine is only for you. Do not share this medicine with others. What if I miss a dose? If you miss a dose, take it as soon as you can. If it is almost time for your next dose, take only that dose. Do not take double or extra doses. What may interact with this medicine? -alcohol -aspirin -cidofovir -diuretics -lithium -methotrexate -other drugs  for inflammation like ketorolac or prednisone -pemetrexed -probenecid -warfarin This list may not describe all possible interactions. Give your health care provider a list of all the medicines, herbs, non-prescription drugs, or dietary supplements you use. Also tell them if you smoke, drink alcohol, or use illegal drugs. Some items may interact with your medicine. What should I watch for while using this medicine? Tell your doctor or health care professional if your pain does not get better. Talk to your doctor before taking another medicine for pain. Do not treat yourself. This medicine does not prevent heart attack or stroke. In fact, this medicine may increase the chance of a heart attack or stroke. The chance may increase with longer use of this medicine and in people who have heart disease. If you take  aspirin to prevent heart attack or stroke, talk with your doctor or health care professional. Do not take other medicines that contain aspirin, ibuprofen, or naproxen with this medicine. Side effects such as stomach upset, nausea, or ulcers may be more likely to occur. Many medicines available without a prescription should not be taken with this medicine. This medicine can cause ulcers and bleeding in the stomach and intestines at any time during treatment. Do not smoke cigarettes or drink alcohol. These increase irritation to your stomach and can make it more susceptible to damage from this medicine. Ulcers and bleeding can happen without warning symptoms and can cause death. You may get drowsy or dizzy. Do not drive, use machinery, or do anything that needs mental alertness until you know how this medicine affects you. Do not stand or sit up quickly, especially if you are an older patient. This reduces the risk of dizzy or fainting spells. This medicine can cause you to bleed more easily. Try to avoid damage to your teeth and gums when you brush or floss your teeth. What side effects may I notice from receiving this medicine? Side effects that you should report to your doctor or health care professional as soon as possible: -black or bloody stools, blood in the urine or vomit -blurred vision -chest pain -difficulty breathing or wheezing -nausea or vomiting -severe stomach pain -skin rash, skin redness, blistering or peeling skin, hives, or itching -slurred speech or weakness on one side of the body -swelling of eyelids, throat, lips -unexplained weight gain or swelling -unusually weak or tired -yellowing of eyes or skin Side effects that usually do not require medical attention (report to your doctor or health care professional if they continue or are bothersome): -constipation -headache -heartburn This list may not describe all possible side effects. Call your doctor for medical advice  about side effects. You may report side effects to FDA at 1-800-FDA-1088. Where should I keep my medicine? Keep out of the reach of children. Store at room temperature between 15 and 30 degrees C (59 and 86 degrees F). Keep container tightly closed. Throw away any unused medicine after the expiration date. NOTE: This sheet is a summary. It may not cover all possible information. If you have questions about this medicine, talk to your doctor, pharmacist, or health care provider.  2015, Elsevier/Gold Standard. (2009-03-28 20:10:16)  Orphenadrine tablets What is this medicine? ORPHENADRINE (or FEN a dreen) helps to relieve pain and stiffness in muscles and can treat muscle spasms. This medicine may be used for other purposes; ask your health care provider or pharmacist if you have questions. COMMON BRAND NAME(S): Norflex What should I tell my health care provider before I take  this medicine? They need to know if you have any of these conditions: -glaucoma -heart disease -kidney disease -myasthenia gravis -peptic ulcer disease -prostate disease -stomach problems -an unusual or allergic reaction to orphenadrine, other medicines, foods, lactose, dyes, or preservatives -pregnant or trying to get pregnant -breast-feeding How should I use this medicine? Take this medicine by mouth with a full glass of water. Follow the directions on the prescription label. Take your medicine at regular intervals. Do not take your medicine more often than directed. Do not take more than you are told to take. Talk to your pediatrician regarding the use of this medicine in children. Special care may be needed. Patients over 27 years old may have a stronger reaction and need a smaller dose. Overdosage: If you think you have taken too much of this medicine contact a poison control center or emergency room at once. NOTE: This medicine is only for you. Do not share this medicine with others. What if I miss a dose? If  you miss a dose, take it as soon as you can. If it is almost time for your next dose, take only that dose. Do not take double or extra doses. What may interact with this medicine? -alcohol -antihistamines -barbiturates, like phenobarbital -benzodiazepines -cyclobenzaprine -medicines for pain -phenothiazines like chlorpromazine, mesoridazine, prochlorperazine, thioridazine This list may not describe all possible interactions. Give your health care provider a list of all the medicines, herbs, non-prescription drugs, or dietary supplements you use. Also tell them if you smoke, drink alcohol, or use illegal drugs. Some items may interact with your medicine. What should I watch for while using this medicine? Your mouth may get dry. Chewing sugarless gum or sucking hard candy, and drinking plenty of water may help. Contact your doctor if the problem does not go away or is severe. This medicine may cause dry eyes and blurred vision. If you wear contact lenses you may feel some discomfort. Lubricating drops may help. See your eye doctor if the problem does not go away or is severe. You may get drowsy or dizzy. Do not drive, use machinery, or do anything that needs mental alertness until you know how this medicine affects you. Do not stand or sit up quickly, especially if you are an older patient. This reduces the risk of dizzy or fainting spells. Alcohol may interfere with the effect of this medicine. Avoid alcoholic drinks. What side effects may I notice from receiving this medicine? Side effects that you should report to your doctor or health care professional as soon as possible: -allergic reactions like skin rash, itching or hives, swelling of the face, lips, or tongue -changes in vision -difficulty breathing -fast heartbeat or palpitations -hallucinations -light headedness, fainting spells -vomiting Side effects that usually do not require medical attention (report to your doctor or health care  professional if they continue or are bothersome): -dizziness -drowsiness -headache -nausea This list may not describe all possible side effects. Call your doctor for medical advice about side effects. You may report side effects to FDA at 1-800-FDA-1088. Where should I keep my medicine? Keep out of the reach of children. Store at room temperature between 15 and 30 degrees C (59 and 86 degrees F). Protect from light. Keep container tightly closed. Throw away any unused medicine after the expiration date. NOTE: This sheet is a summary. It may not cover all possible information. If you have questions about this medicine, talk to your doctor, pharmacist, or health care provider.  2015, Elsevier/Gold Standard. (  2007-10-21 17:19:12) ° °

## 2013-12-28 ENCOUNTER — Emergency Department (HOSPITAL_COMMUNITY)
Admission: EM | Admit: 2013-12-28 | Discharge: 2013-12-28 | Disposition: A | Discharge status: Home / Self Care | Payer: BC Managed Care – PPO | Attending: Emergency Medicine | Admitting: Emergency Medicine

## 2013-12-28 ENCOUNTER — Encounter (HOSPITAL_COMMUNITY): Payer: Self-pay | Admitting: Emergency Medicine

## 2013-12-28 ENCOUNTER — Emergency Department (HOSPITAL_COMMUNITY): Payer: BC Managed Care – PPO

## 2013-12-28 DIAGNOSIS — S93409A Sprain of unspecified ligament of unspecified ankle, initial encounter: Secondary | ICD-10-CM | POA: Diagnosis not present

## 2013-12-28 DIAGNOSIS — IMO0002 Reserved for concepts with insufficient information to code with codable children: Secondary | ICD-10-CM | POA: Insufficient documentation

## 2013-12-28 DIAGNOSIS — Y9289 Other specified places as the place of occurrence of the external cause: Secondary | ICD-10-CM | POA: Diagnosis not present

## 2013-12-28 DIAGNOSIS — J45909 Unspecified asthma, uncomplicated: Secondary | ICD-10-CM | POA: Diagnosis not present

## 2013-12-28 DIAGNOSIS — F17200 Nicotine dependence, unspecified, uncomplicated: Secondary | ICD-10-CM | POA: Insufficient documentation

## 2013-12-28 DIAGNOSIS — R300 Dysuria: Secondary | ICD-10-CM | POA: Diagnosis not present

## 2013-12-28 DIAGNOSIS — Y9389 Activity, other specified: Secondary | ICD-10-CM | POA: Diagnosis not present

## 2013-12-28 DIAGNOSIS — M545 Low back pain, unspecified: Secondary | ICD-10-CM

## 2013-12-28 DIAGNOSIS — G8929 Other chronic pain: Secondary | ICD-10-CM | POA: Insufficient documentation

## 2013-12-28 DIAGNOSIS — S59909A Unspecified injury of unspecified elbow, initial encounter: Secondary | ICD-10-CM | POA: Diagnosis not present

## 2013-12-28 DIAGNOSIS — S8990XA Unspecified injury of unspecified lower leg, initial encounter: Secondary | ICD-10-CM | POA: Diagnosis present

## 2013-12-28 DIAGNOSIS — S99919A Unspecified injury of unspecified ankle, initial encounter: Secondary | ICD-10-CM

## 2013-12-28 DIAGNOSIS — S93402A Sprain of unspecified ligament of left ankle, initial encounter: Secondary | ICD-10-CM

## 2013-12-28 DIAGNOSIS — S6990XA Unspecified injury of unspecified wrist, hand and finger(s), initial encounter: Secondary | ICD-10-CM

## 2013-12-28 DIAGNOSIS — Z8659 Personal history of other mental and behavioral disorders: Secondary | ICD-10-CM | POA: Diagnosis not present

## 2013-12-28 DIAGNOSIS — R296 Repeated falls: Secondary | ICD-10-CM | POA: Insufficient documentation

## 2013-12-28 DIAGNOSIS — M25531 Pain in right wrist: Secondary | ICD-10-CM

## 2013-12-28 DIAGNOSIS — S99929A Unspecified injury of unspecified foot, initial encounter: Secondary | ICD-10-CM

## 2013-12-28 DIAGNOSIS — S59919A Unspecified injury of unspecified forearm, initial encounter: Secondary | ICD-10-CM

## 2013-12-28 DIAGNOSIS — I10 Essential (primary) hypertension: Secondary | ICD-10-CM | POA: Insufficient documentation

## 2013-12-28 LAB — URINALYSIS, ROUTINE W REFLEX MICROSCOPIC
BILIRUBIN URINE: NEGATIVE
Glucose, UA: NEGATIVE mg/dL
HGB URINE DIPSTICK: NEGATIVE
KETONES UR: NEGATIVE mg/dL
Leukocytes, UA: NEGATIVE
NITRITE: NEGATIVE
PH: 6.5 (ref 5.0–8.0)
Protein, ur: NEGATIVE mg/dL
SPECIFIC GRAVITY, URINE: 1.01 (ref 1.005–1.030)
Urobilinogen, UA: 0.2 mg/dL (ref 0.0–1.0)

## 2013-12-28 NOTE — ED Notes (Addendum)
Pt reports BLE swelling x2 weeks, right wrist pain, and lower back pain. Pt denies and known injury.pt reports seen for same on September 8th with no improvement.

## 2013-12-28 NOTE — Discharge Instructions (Signed)

## 2013-12-28 NOTE — ED Provider Notes (Signed)
CSN: [id number]     Arrival date & time 12/28/13  1231 History  This chart was scribed for Teressa Lower, NP with Benny Lennert, MD by Tonye Royalty, ED Scribe. This patient was seen in room APFT22/APFT22 and the patient's care was started at 12:42 PM.    Chief Complaint  Patient presents with  . Leg Swelling   The history is provided by the patient. No language interpreter was used.   HPI Comments: Bobby Bridges is a 38 y.o. male who presents to the Emergency Department complaining of pain to his left ankle, right wrist, and lower back. He reports falling and hurting his wrist a few days ago. He states pain is worse when laying down and reports difficulty sleeping due to pain. He reports history of swelling to his ankle for which he takes a fluid pill but reports continued swelling. He reports associated fever that resolved with medication. He reports associated dysuria. He denies penile discharge  Past Medical History  Diagnosis Date  . Hypertension   . Anxiety   . Depression   . Asthma   . Seizures     outgrew by age 24  . Chronic back pain    History reviewed. No pertinent past surgical history. History reviewed. No pertinent family history. History  Substance Use Topics  . Smoking status: Former Smoker -- 1.00 packs/day for 2 years    Types: Cigarettes    Quit date: 04/08/2012  . Smokeless tobacco: Never Used  . Alcohol Use: No    Review of Systems  Constitutional: Positive for fever.  Genitourinary: Positive for dysuria. Negative for discharge.  Musculoskeletal: Positive for arthralgias (left ankle, right wrist) and back pain.      Allergies  Review of patient's allergies indicates no known allergies.  Home Medications   Prior to Admission medications   Medication Sig Start Date End Date Taking? Authorizing Provider  cyclobenzaprine (FLEXERIL) 10 MG tablet Take 1 tablet (10 mg total) by mouth 2 (two) times daily as needed for muscle spasms. 11/28/13    Hope Orlene Och, NP  HYDROcodone-acetaminophen (NORCO/VICODIN) 5-325 MG per tablet Take 1 tablet by mouth every 6 (six) hours as needed for moderate pain or severe pain. 09/24/13   Gerhard Munch, MD  HYDROcodone-acetaminophen (NORCO/VICODIN) 5-325 MG per tablet Take 1 tablet by mouth every 4 (four) hours as needed. 11/28/13   Hope Orlene Och, NP  naproxen (NAPROSYN) 375 MG tablet Take 1 tablet (375 mg total) by mouth 2 (two) times daily. 11/28/13   Hope Orlene Och, NP  naproxen (NAPROSYN) 500 MG tablet Take 1 tablet (500 mg total) by mouth 2 (two) times daily. 12/15/13   [name], MD  orphenadrine (NORFLEX) 100 MG tablet Take 1 tablet (100 mg total) by mouth 2 (two) times daily. 12/15/13   [name], MD  zolpidem (AMBIEN) 5 MG tablet Take 1 tablet (5 mg total) by mouth at bedtime as needed for sleep. 10/24/13   [name], MD   BP 134/77  Pulse 69  Temp(Src) 98.7 F (37.1 C)  Resp 18  Ht  (1.803 m)  Wt 310 lb 3 oz (140.7 kg)  BMI 43.28 kg/m2  SpO2 100% Physical Exam  Nursing note and vitals reviewed. Constitutional: He is oriented to person, place, and time. He appears well-developed and well-nourished.  HENT:  Head: Normocephalic and atraumatic.  Eyes: Conjunctivae are normal.  Neck: Normal range of motion. Neck supple.  Pulmonary/Chest: Effort normal.  Musculoskeletal: Normal range of  motion.  swelling to left lateral ankle, with full ROM, tenderness in medial aspect of right wrist, lumbar paraspinal tenderness. Moves all extremities without any problem. No redness or warmth noted  Neurological: He is alert and oriented to person, place, and time.  Skin: Skin is warm and dry.  Psychiatric: He has a normal mood and affect.    ED Course  Procedures (including critical care time) Labs Review Labs Reviewed - No data to display  Imaging Review No results found.   EKG Interpretation None     DIAGNOSTIC STUDIES: Oxygen Saturation is 100% on room air, normal by my interpretation.     COORDINATION OF CARE: 12:45 PM Discussed treatment plan with patient at beside, the patient agrees with the plan and has no further questions at this time.   MDM   Final diagnoses:  Ankle sprain, left, initial encounter  Right wrist pain  Midline low back pain without sciatica   No acute bony abnormality noted. Pt is okay to follow up as needed ankle and wrist wrapped for comfort  I personally performed the services described in this documentation, which was scribed in my presence. The recorded information has been reviewed and is accurate.      Teressa Lower, NP 12/28/13 1415

## 2013-12-29 ENCOUNTER — Emergency Department (HOSPITAL_COMMUNITY)
Admission: EM | Admit: 2013-12-29 | Discharge: 2013-12-29 | Disposition: A | Discharge status: Home / Self Care | Payer: BC Managed Care – PPO | Attending: Emergency Medicine | Admitting: Emergency Medicine

## 2013-12-29 ENCOUNTER — Encounter (HOSPITAL_COMMUNITY): Payer: Self-pay | Admitting: Emergency Medicine

## 2013-12-29 DIAGNOSIS — Z8659 Personal history of other mental and behavioral disorders: Secondary | ICD-10-CM | POA: Insufficient documentation

## 2013-12-29 DIAGNOSIS — M549 Dorsalgia, unspecified: Secondary | ICD-10-CM | POA: Diagnosis present

## 2013-12-29 DIAGNOSIS — Z87891 Personal history of nicotine dependence: Secondary | ICD-10-CM | POA: Insufficient documentation

## 2013-12-29 DIAGNOSIS — K219 Gastro-esophageal reflux disease without esophagitis: Secondary | ICD-10-CM | POA: Insufficient documentation

## 2013-12-29 DIAGNOSIS — M545 Low back pain, unspecified: Secondary | ICD-10-CM | POA: Diagnosis not present

## 2013-12-29 DIAGNOSIS — G8929 Other chronic pain: Secondary | ICD-10-CM | POA: Diagnosis not present

## 2013-12-29 DIAGNOSIS — J45909 Unspecified asthma, uncomplicated: Secondary | ICD-10-CM | POA: Insufficient documentation

## 2013-12-29 DIAGNOSIS — I10 Essential (primary) hypertension: Secondary | ICD-10-CM | POA: Insufficient documentation

## 2013-12-29 MED ORDER — RANITIDINE HCL 150 MG PO CAPS: 150.0000 mg | ORAL_CAPSULE | Freq: Every day | ORAL | Status: DC

## 2013-12-29 NOTE — Discharge Instructions (Signed)

## 2013-12-29 NOTE — ED Provider Notes (Signed)
Medical screening examination/treatment/procedure(s) were performed by non-physician practitioner and as supervising physician I was immediately available for consultation/collaboration.   EKG Interpretation None        Bobby Bridges L Hania Cerone, MD 12/29/13 1256 

## 2013-12-29 NOTE — ED Provider Notes (Signed)
CSN: [id number]     Arrival date & time 12/29/13  1307 History   First MD Initiated Contact with Patient 12/29/13 1312     Chief Complaint  Patient presents with  . Back Pain     (Consider location/radiation/quality/duration/timing/severity/associated sxs/prior Treatment) HPI Comments: Pt states that he has ongoing back pain with radiatio to the bilateral legs. Denies numbness or weakness. Pt states that the he took the ibuprofen given to him and he is not getting any relief. He states that he can't afford to see a doctor. Denies dysuria or incontinence  The history is provided by the patient. No language interpreter was used.    Past Medical History  Diagnosis Date  . Hypertension   . Anxiety   . Depression   . Asthma   . Seizures     outgrew by age 76  . Chronic back pain    History reviewed. No pertinent past surgical history. History reviewed. No pertinent family history. History  Substance Use Topics  . Smoking status: Former Smoker -- 1.00 packs/day for 2 years    Types: Cigarettes    Quit date: 04/08/2012  . Smokeless tobacco: Never Used  . Alcohol Use: No    Review of Systems  Constitutional: Negative.   Respiratory: Negative.   Cardiovascular: Negative.   Genitourinary: Negative.       Allergies  Review of patient's allergies indicates no known allergies.  Home Medications   Prior to Admission medications   Medication Sig Start Date End Date Taking? Authorizing Provider  zolpidem (AMBIEN) 5 MG tablet Take 1 tablet (5 mg total) by mouth at bedtime as needed for sleep. 10/24/13  Yes [name], MD   BP 128/75  Pulse 77  Temp(Src) 97.7 F (36.5 C) (Oral)  Resp 18  Ht  (1.803 m)  Wt 310 lb (140.615 kg)  BMI 43.26 kg/m2  SpO2 100% Physical Exam  Nursing note and vitals reviewed. Constitutional: He is oriented to person, place, and time. He appears well-developed and well-nourished.  Cardiovascular: Normal rate and regular rhythm.    Pulmonary/Chest: Effort normal and breath sounds normal.  Musculoskeletal: Normal range of motion.  Lumbar paraspinal tenderness  Neurological: He is alert and oriented to person, place, and time.    ED Course  Procedures (including critical care time) Labs Review Labs Reviewed - No data to display  Imaging Review Dg Wrist Complete Right  12/28/2013   CLINICAL DATA:  Ulnar side wrist pain.  EXAM: RIGHT WRIST - COMPLETE 3+ VIEW  COMPARISON:  08/24/2013.  FINDINGS: No acute osseous or joint abnormality.  No degenerative changes.  IMPRESSION: No acute osseous or joint abnormality.   Electronically Signed   By: Leanna Battles M.D.   On: 12/28/2013 13:42   Dg Ankle Complete Left  12/28/2013   CLINICAL DATA:  Lateral right ankle pain and swelling following a fall 2 days ago.  EXAM: LEFT ANKLE COMPLETE - 3+ VIEW  COMPARISON:  None.  FINDINGS: Mild diffuse soft tissue swelling. No fracture, dislocation or effusion seen. Minimal posterior talotibial spur formation. Moderate sized inferior calcaneal spur.  IMPRESSION: No fracture.   Electronically Signed   By: Gordan Payment M.D.   On: 12/28/2013 13:41     EKG Interpretation None      MDM   Final diagnoses:  Chronic pain  Gastroesophageal reflux disease without esophagitis    When explained to pt that narcotics werent going to be given for pain he asked he could get  something for reflux    Teressa Lower, NP 12/29/13 1329

## 2013-12-29 NOTE — ED Notes (Signed)
Pt with chronic lower back pain and leg pain, states 800 mg Ibuprofen is not helping

## 2013-12-29 NOTE — ED Notes (Signed)
Patient complaining of back pain and bilateral leg pain. Patient seen here yesterday for same.

## 2013-12-30 NOTE — ED Provider Notes (Signed)
Medical screening examination/treatment/procedure(s) were performed by non-physician practitioner and as supervising physician I was immediately available for consultation/collaboration.   EKG Interpretation None        Nevan Creighton L Rosario Kushner, MD 12/30/13 1547 

## 2014-08-29 ENCOUNTER — Emergency Department (HOSPITAL_COMMUNITY): Payer: BLUE CROSS/BLUE SHIELD

## 2014-08-29 ENCOUNTER — Emergency Department (HOSPITAL_COMMUNITY)
Admission: EM | Admit: 2014-08-29 | Discharge: 2014-08-29 | Disposition: A | Discharge status: Home / Self Care | Payer: BLUE CROSS/BLUE SHIELD | Attending: Emergency Medicine | Admitting: Emergency Medicine

## 2014-08-29 ENCOUNTER — Encounter (HOSPITAL_COMMUNITY): Payer: Self-pay

## 2014-08-29 DIAGNOSIS — Z8659 Personal history of other mental and behavioral disorders: Secondary | ICD-10-CM | POA: Insufficient documentation

## 2014-08-29 DIAGNOSIS — H539 Unspecified visual disturbance: Secondary | ICD-10-CM | POA: Insufficient documentation

## 2014-08-29 DIAGNOSIS — K5900 Constipation, unspecified: Secondary | ICD-10-CM | POA: Insufficient documentation

## 2014-08-29 DIAGNOSIS — R300 Dysuria: Secondary | ICD-10-CM | POA: Insufficient documentation

## 2014-08-29 DIAGNOSIS — M549 Dorsalgia, unspecified: Secondary | ICD-10-CM | POA: Insufficient documentation

## 2014-08-29 DIAGNOSIS — Z87891 Personal history of nicotine dependence: Secondary | ICD-10-CM | POA: Insufficient documentation

## 2014-08-29 DIAGNOSIS — R110 Nausea: Secondary | ICD-10-CM | POA: Insufficient documentation

## 2014-08-29 DIAGNOSIS — J45901 Unspecified asthma with (acute) exacerbation: Secondary | ICD-10-CM | POA: Insufficient documentation

## 2014-08-29 DIAGNOSIS — I10 Essential (primary) hypertension: Secondary | ICD-10-CM | POA: Insufficient documentation

## 2014-08-29 DIAGNOSIS — G8929 Other chronic pain: Secondary | ICD-10-CM | POA: Insufficient documentation

## 2014-08-29 DIAGNOSIS — R079 Chest pain, unspecified: Secondary | ICD-10-CM

## 2014-08-29 DIAGNOSIS — R197 Diarrhea, unspecified: Secondary | ICD-10-CM | POA: Insufficient documentation

## 2014-08-29 DIAGNOSIS — R0789 Other chest pain: Secondary | ICD-10-CM | POA: Insufficient documentation

## 2014-08-29 HISTORY — DX: Sleep apnea, unspecified: G47.30

## 2014-08-29 LAB — CBC WITH DIFFERENTIAL/PLATELET
Basophils Absolute: 0 10*3/uL (ref 0.0–0.1)
Basophils Relative: 0 % (ref 0–1)
EOS ABS: 0 10*3/uL (ref 0.0–0.7)
Eosinophils Relative: 1 % (ref 0–5)
HEMATOCRIT: 43.9 % (ref 39.0–52.0)
Hemoglobin: 14.4 g/dL (ref 13.0–17.0)
Lymphocytes Relative: 39 % (ref 12–46)
Lymphs Abs: 1.8 10*3/uL (ref 0.7–4.0)
MCH: 27.7 pg (ref 26.0–34.0)
MCHC: 32.8 g/dL (ref 30.0–36.0)
MCV: 84.4 fL (ref 78.0–100.0)
Monocytes Absolute: 0.4 10*3/uL (ref 0.1–1.0)
Monocytes Relative: 9 % (ref 3–12)
NEUTROS PCT: 51 % (ref 43–77)
Neutro Abs: 2.3 10*3/uL (ref 1.7–7.7)
Platelets: 234 10*3/uL (ref 150–400)
RBC: 5.2 MIL/uL (ref 4.22–5.81)
RDW: 12.7 % (ref 11.5–15.5)
WBC: 4.6 10*3/uL (ref 4.0–10.5)

## 2014-08-29 LAB — I-STAT TROPONIN, ED: TROPONIN I, POC: 0 ng/mL (ref 0.00–0.08)

## 2014-08-29 LAB — BASIC METABOLIC PANEL
Anion gap: 7 (ref 5–15)
BUN: 16 mg/dL (ref 6–20)
CALCIUM: 8.9 mg/dL (ref 8.9–10.3)
CHLORIDE: 106 mmol/L (ref 101–111)
CO2: 26 mmol/L (ref 22–32)
Creatinine, Ser: 1.04 mg/dL (ref 0.61–1.24)
GFR calc non Af Amer: >60 mL/min (ref >60)
Glucose, Bld: 97 mg/dL (ref 65–99)
POTASSIUM: 3.5 mmol/L (ref 3.5–5.1)
SODIUM: 139 mmol/L (ref 135–145)

## 2014-08-29 MED ORDER — ASPIRIN 81 MG PO CHEW
324.0000 mg | CHEWABLE_TABLET | Freq: Once | ORAL | Status: AC
  Administered 2014-08-29: 324 mg via ORAL
  Filled 2014-08-29: qty 4

## 2014-08-29 MED ORDER — NAPROXEN 500 MG PO TABS: 500.0000 mg | ORAL_TABLET | Freq: Two times a day (BID) | ORAL | Status: DC

## 2014-08-29 NOTE — ED Notes (Signed)
Pt states that he is hurting all over and has been for the past few weeks.  States it hurts to walk and he is just sore.  C/o headache and vomiting for the past 2 weeks as well.  States just a dull ache in his head.  Pt drinking water at this time without difficulty.

## 2014-08-29 NOTE — ED Provider Notes (Addendum)
CSN: [id number]     Arrival date & time 08/29/14  [id number] History  This chart was scribed for Vanetta MuldersScott Valina Maes, MD by Haywood PaoNadim Abu Hashem, ED Scribe. The patient was seen in [facility] and the patient's care was started at 9:44 AM.  Chief Complaint  Patient presents with  . Headache   Patient is a 39 y.o. male presenting with headaches and chest pain. The history is provided by the patient. No language interpreter was used.  Headache Associated symptoms: abdominal pain, back pain, cough, diarrhea and nausea   Associated symptoms: no fever, no sore throat and no vomiting   Chest Pain Pain location:  Substernal area Pain radiates to:  Does not radiate Pain severity:  Mild Duration:  1 week Timing:  Intermittent Relieved by:  Nothing Worsened by:  Nothing tried Ineffective treatments:  None tried Associated symptoms: abdominal pain, back pain, cough, headache, nausea and shortness of breath   Associated symptoms: no fever and not vomiting     HPI Comments: Bobby Bridges is a 39 y.o. male who presents to the Emergency Department complaining of substernal chest pain for one week. He describes the pain as intermittent nad non radiating lasting up to one hour. He describes the sensation as a flutter. Family history of heart disease before 38 years old. He also complaining of generalized pain  Past Medical History  Diagnosis Date  . Hypertension   . Anxiety   . Depression   . Asthma   . Seizures     outgrew by age 39  . Chronic back pain   . Heart disease Heart disease  . Sleep apnea sleep apnea   History reviewed. No pertinent past surgical history. Family History  Problem Relation Age of Onset  . Cancer Mother   . Hypertension Mother   . Cancer Father   . Hypertension Father    History  Substance Use Topics  . Smoking status: Former Smoker -- 1.00 packs/day for 2 years    Types: Cigarettes    Quit date: 04/08/2012  . Smokeless tobacco: Never Used  . Alcohol Use: No     Review of Systems  Constitutional: Positive for chills. Negative for fever.  HENT: Negative for rhinorrhea and sore throat.   Eyes: Positive for visual disturbance.  Respiratory: Positive for cough and shortness of breath.   Cardiovascular: Positive for chest pain. Negative for leg swelling.  Gastrointestinal: Positive for nausea, abdominal pain, diarrhea and constipation. Negative for vomiting.  Genitourinary: Positive for dysuria.  Musculoskeletal: Positive for back pain.  Skin: Negative for rash.  Neurological: Positive for headaches.  Hematological: Does not bruise/bleed easily.    Allergies  Review of patient's allergies indicates no known allergies.  Home Medications   Prior to Admission medications   Medication Sig Start Date End Date Taking? Authorizing Provider  naproxen (NAPROSYN) 500 MG tablet Take 1 tablet (500 mg total) by mouth 2 (two) times daily. 08/29/14   Vanetta MuldersScott Krislynn Gronau, MD  ranitidine (ZANTAC) 150 MG capsule Take 1 capsule (150 mg total) by mouth daily. Patient not taking: Reported on 08/29/2014 12/29/13   Teressa LowerVrinda Pickering, NP  zolpidem (AMBIEN) 5 MG tablet Take 1 tablet (5 mg total) by mouth at bedtime as needed for sleep. Patient not taking: Reported on 08/29/2014 10/24/13   Dione Boozeavid Glick, MD   BP 131/105 mmHg  Pulse 65  Temp(Src) 97.7 F (36.5 C) (Oral)  Resp 19  Ht 5\' 9"  (1.753 m)  Wt 318 lb 12.8 oz (144.607 kg)  BMI 47.06 kg/m2  SpO2 100% Physical Exam  Constitutional: He is oriented to person, place, and time. He appears well-developed and well-nourished.  HENT:  Head: Normocephalic and atraumatic.  Mucous membranes are moist.  Eyes: No scleral icterus.  Right eye drifts laterally.  Neck: Normal range of motion.  Cardiovascular: Normal rate, regular rhythm and normal heart sounds.   No murmur heard. Pulmonary/Chest: Effort normal and breath sounds normal.  Abdominal: Bowel sounds are normal.  Genitourinary: Circumcised.  2 mm skin tag on the  lateral aspect of the penis.  Musculoskeletal: Normal range of motion. He exhibits no edema.  Neurological: He is alert and oriented to person, place, and time. No cranial nerve deficit.  Skin: Skin is warm and dry.  Psychiatric: He has a normal mood and affect. His behavior is normal.  Nursing note and vitals reviewed.   ED Course  Procedures  DIAGNOSTIC STUDIES: Oxygen Saturation is 99% on room air, normal by my interpretation.    COORDINATION OF CARE: 9:52 AM Discussed treatment plan with pt at bedside and pt agreed to plan.  Results for orders placed or performed during the hospital encounter of 08/29/14  CBC with Differential/Platelet  Result Value Ref Range   WBC 4.6 4.0 - 10.5 K/uL   RBC 5.20 4.22 - 5.81 MIL/uL   Hemoglobin 14.4 13.0 - 17.0 g/dL   HCT 16.1 09.6 - 04.5 %   MCV 84.4 78.0 - 100.0 fL   MCH 27.7 26.0 - 34.0 pg   MCHC 32.8 30.0 - 36.0 g/dL   RDW 40.9 81.1 - 91.4 %   Platelets 234 150 - 400 K/uL   Neutrophils Relative % 51 43 - 77 %   Neutro Abs 2.3 1.7 - 7.7 K/uL   Lymphocytes Relative 39 12 - 46 %   Lymphs Abs 1.8 0.7 - 4.0 K/uL   Monocytes Relative 9 3 - 12 %   Monocytes Absolute 0.4 0.1 - 1.0 K/uL   Eosinophils Relative 1 0 - 5 %   Eosinophils Absolute 0.0 0.0 - 0.7 K/uL   Basophils Relative 0 0 - 1 %   Basophils Absolute 0.0 0.0 - 0.1 K/uL  Basic metabolic panel  Result Value Ref Range   Sodium 139 135 - 145 mmol/L   Potassium 3.5 3.5 - 5.1 mmol/L   Chloride 106 101 - 111 mmol/L   CO2 26 22 - 32 mmol/L   Glucose, Bld 97 65 - 99 mg/dL   BUN 16 6 - 20 mg/dL   Creatinine, Ser 7.82 0.61 - 1.24 mg/dL   Calcium 8.9 8.9 - 95.6 mg/dL   GFR calc non Af Amer >60 >60 mL/min   GFR calc Af Amer >60 >60 mL/min   Anion gap 7 5 - 15  I-Stat Troponin, ED (not at [facility])  Result Value Ref Range   Troponin i, poc 0.00 0.00 - 0.08 ng/mL   Comment 3           Dg Chest 2 View  08/29/2014   CLINICAL DATA:  Substernal chest pain x1 week  EXAM: CHEST  2 VIEW   COMPARISON:  10/23/2013  FINDINGS: Lungs are clear.  No pleural effusion or pneumothorax.  The heart is normal in size.  Visualized osseous structures are within normal limits.  IMPRESSION: No evidence of acute cardiopulmonary disease.   Electronically Signed   By: Charline Bills M.D.   On: 08/29/2014 11:06    Medications  aspirin chewable tablet 324 mg (324 mg Oral  Given 08/29/14 1028)  ED ECG REPORT   Date: 08/29/2014  Rate: 69  Rhythm: normal sinus rhythm  QRS Axis: right  Intervals: normal  ST/T Wave abnormalities: normal  Conduction Disutrbances:none  Narrative Interpretation:   Old EKG Reviewed: none available  I have personally reviewed the EKG tracing and agree with the computerized printout as noted.   MDM   Final diagnoses:  Chest pain   Patient with a constellation of complaints. But the main concern was chest pain. Patient had in the substernal area. The chest pain is been intermittent. Ongoing for one week. The worse today.  Workup for the chest pain was negative troponin without any evidence of acute cardiac event. EKG without any acute changes.  Patient will be treated with anti-inflammatory medicine and follow-up with his regular doctor.    I personally performed the services described in this documentation, which was scribed in my presence. The recorded information has been reviewed and is accurate.       Vanetta Mulders, MD 08/29/14 1136  Vanetta Mulders, MD 08/29/14 1137

## 2014-08-29 NOTE — ED Notes (Addendum)
Reports pounding headache for one month. Feels like it is affecting eyes. Also says heart feels like it flutters and then goes back to normal. Occurring for a week. Occurs during ambulation. Legs burning sensation. Heartburn for 4 months

## 2014-08-29 NOTE — Discharge Instructions (Signed)
Technique medication as directed. Taken at least for 7 days. Work note provided to be off today. Return for any new or worse symptoms.

## 2014-09-22 ENCOUNTER — Encounter (HOSPITAL_COMMUNITY): Payer: Self-pay | Admitting: Emergency Medicine

## 2014-09-22 ENCOUNTER — Other Ambulatory Visit: Payer: Self-pay

## 2014-09-22 ENCOUNTER — Emergency Department (HOSPITAL_COMMUNITY): Payer: BLUE CROSS/BLUE SHIELD

## 2014-09-22 ENCOUNTER — Other Ambulatory Visit (HOSPITAL_COMMUNITY): Payer: Self-pay

## 2014-09-22 ENCOUNTER — Observation Stay (HOSPITAL_COMMUNITY)
Admission: EM | Admit: 2014-09-22 | Discharge: 2014-09-23 | Disposition: A | Discharge status: Home / Self Care | Payer: BLUE CROSS/BLUE SHIELD | Attending: Internal Medicine | Admitting: Internal Medicine

## 2014-09-22 DIAGNOSIS — F419 Anxiety disorder, unspecified: Secondary | ICD-10-CM | POA: Insufficient documentation

## 2014-09-22 DIAGNOSIS — J45909 Unspecified asthma, uncomplicated: Secondary | ICD-10-CM | POA: Insufficient documentation

## 2014-09-22 DIAGNOSIS — R110 Nausea: Secondary | ICD-10-CM | POA: Diagnosis not present

## 2014-09-22 DIAGNOSIS — G8929 Other chronic pain: Secondary | ICD-10-CM | POA: Insufficient documentation

## 2014-09-22 DIAGNOSIS — W57XXXA Bitten or stung by nonvenomous insect and other nonvenomous arthropods, initial encounter: Secondary | ICD-10-CM

## 2014-09-22 DIAGNOSIS — R05 Cough: Secondary | ICD-10-CM | POA: Insufficient documentation

## 2014-09-22 DIAGNOSIS — Z79899 Other long term (current) drug therapy: Secondary | ICD-10-CM | POA: Insufficient documentation

## 2014-09-22 DIAGNOSIS — R7989 Other specified abnormal findings of blood chemistry: Secondary | ICD-10-CM | POA: Diagnosis not present

## 2014-09-22 DIAGNOSIS — G4730 Sleep apnea, unspecified: Secondary | ICD-10-CM | POA: Insufficient documentation

## 2014-09-22 DIAGNOSIS — K219 Gastro-esophageal reflux disease without esophagitis: Secondary | ICD-10-CM | POA: Diagnosis present

## 2014-09-22 DIAGNOSIS — F329 Major depressive disorder, single episode, unspecified: Secondary | ICD-10-CM | POA: Insufficient documentation

## 2014-09-22 DIAGNOSIS — Z9981 Dependence on supplemental oxygen: Secondary | ICD-10-CM | POA: Insufficient documentation

## 2014-09-22 DIAGNOSIS — I10 Essential (primary) hypertension: Secondary | ICD-10-CM | POA: Insufficient documentation

## 2014-09-22 DIAGNOSIS — R059 Cough, unspecified: Secondary | ICD-10-CM

## 2014-09-22 DIAGNOSIS — Y9389 Activity, other specified: Secondary | ICD-10-CM | POA: Insufficient documentation

## 2014-09-22 DIAGNOSIS — Z87891 Personal history of nicotine dependence: Secondary | ICD-10-CM | POA: Insufficient documentation

## 2014-09-22 DIAGNOSIS — Y998 Other external cause status: Secondary | ICD-10-CM | POA: Insufficient documentation

## 2014-09-22 DIAGNOSIS — R778 Other specified abnormalities of plasma proteins: Secondary | ICD-10-CM | POA: Diagnosis present

## 2014-09-22 DIAGNOSIS — T148 Other injury of unspecified body region: Secondary | ICD-10-CM | POA: Diagnosis not present

## 2014-09-22 DIAGNOSIS — Y9289 Other specified places as the place of occurrence of the external cause: Secondary | ICD-10-CM | POA: Insufficient documentation

## 2014-09-22 DIAGNOSIS — H9192 Unspecified hearing loss, left ear: Secondary | ICD-10-CM | POA: Insufficient documentation

## 2014-09-22 HISTORY — DX: Unspecified hearing loss, left ear: H91.92

## 2014-09-22 HISTORY — DX: Gastro-esophageal reflux disease without esophagitis: K21.9

## 2014-09-22 LAB — CBC WITH DIFFERENTIAL/PLATELET
BASOS ABS: 0 10*3/uL (ref 0.0–0.1)
BASOS PCT: 0 % (ref 0–1)
EOS ABS: 0 10*3/uL (ref 0.0–0.7)
Eosinophils Relative: 0 % (ref 0–5)
HCT: 44 % (ref 39.0–52.0)
Hemoglobin: 14.5 g/dL (ref 13.0–17.0)
Lymphocytes Relative: 33 % (ref 12–46)
Lymphs Abs: 1.6 10*3/uL (ref 0.7–4.0)
MCH: 27.9 pg (ref 26.0–34.0)
MCHC: 33 g/dL (ref 30.0–36.0)
MCV: 84.6 fL (ref 78.0–100.0)
MONOS PCT: 8 % (ref 3–12)
Monocytes Absolute: 0.4 10*3/uL (ref 0.1–1.0)
NEUTROS PCT: 59 % (ref 43–77)
Neutro Abs: 2.9 10*3/uL (ref 1.7–7.7)
Platelets: 210 10*3/uL (ref 150–400)
RBC: 5.2 MIL/uL (ref 4.22–5.81)
RDW: 12.9 % (ref 11.5–15.5)
WBC: 5 10*3/uL (ref 4.0–10.5)

## 2014-09-22 LAB — COMPREHENSIVE METABOLIC PANEL
ALK PHOS: 77 U/L (ref 38–126)
ALT: 29 U/L (ref 17–63)
AST: 22 U/L (ref 15–41)
Albumin: 3.8 g/dL (ref 3.5–5.0)
Anion gap: 7 (ref 5–15)
BILIRUBIN TOTAL: 0.6 mg/dL (ref 0.3–1.2)
BUN: 19 mg/dL (ref 6–20)
CHLORIDE: 106 mmol/L (ref 101–111)
CO2: 25 mmol/L (ref 22–32)
Calcium: 8.9 mg/dL (ref 8.9–10.3)
Creatinine, Ser: 0.98 mg/dL (ref 0.61–1.24)
GFR calc Af Amer: >60 mL/min (ref >60)
GFR calc non Af Amer: >60 mL/min (ref >60)
GLUCOSE: 96 mg/dL (ref 65–99)
Potassium: 3.7 mmol/L (ref 3.5–5.1)
Sodium: 138 mmol/L (ref 135–145)
Total Protein: 7.3 g/dL (ref 6.5–8.1)

## 2014-09-22 LAB — TROPONIN I
TROPONIN I: 0.03 ng/mL (ref <0.031)
Troponin I: 0.04 ng/mL — ABNORMAL HIGH (ref <0.031)
Troponin I: 0.03 ng/mL (ref <0.031)

## 2014-09-22 LAB — LIPASE, BLOOD: Lipase: 20 U/L — ABNORMAL LOW (ref 22–51)

## 2014-09-22 MED ORDER — FAMOTIDINE 20 MG PO TABS
40.0000 mg | ORAL_TABLET | Freq: Once | ORAL | Status: AC
  Administered 2014-09-22: 40 mg via ORAL
  Filled 2014-09-22: qty 2

## 2014-09-22 MED ORDER — ENOXAPARIN SODIUM 40 MG/0.4ML ~~LOC~~ SOLN
40.0000 mg | SUBCUTANEOUS | Status: DC
  Administered 2014-09-22: 40 mg via SUBCUTANEOUS
  Filled 2014-09-22: qty 0.4

## 2014-09-22 MED ORDER — ONDANSETRON HCL 4 MG/2ML IJ SOLN: 4.0000 mg | Freq: Four times a day (QID) | INTRAMUSCULAR | Status: DC | PRN

## 2014-09-22 MED ORDER — ONDANSETRON HCL 4 MG/2ML IJ SOLN: 4.0000 mg | Freq: Three times a day (TID) | INTRAMUSCULAR | Status: DC | PRN

## 2014-09-22 MED ORDER — ACETAMINOPHEN 650 MG RE SUPP: 650.0000 mg | Freq: Four times a day (QID) | RECTAL | Status: DC | PRN

## 2014-09-22 MED ORDER — ACETAMINOPHEN 325 MG PO TABS: 650.0000 mg | ORAL_TABLET | Freq: Four times a day (QID) | ORAL | Status: DC | PRN

## 2014-09-22 MED ORDER — HYDROMORPHONE HCL 1 MG/ML IJ SOLN: 1.0000 mg | INTRAMUSCULAR | Status: DC | PRN

## 2014-09-22 MED ORDER — SODIUM CHLORIDE 0.9 % IV SOLN
INTRAVENOUS | Status: DC
  Administered 2014-09-22 – 2014-09-23 (×2): via INTRAVENOUS

## 2014-09-22 MED ORDER — PANTOPRAZOLE SODIUM 40 MG IV SOLR
40.0000 mg | INTRAVENOUS | Status: DC
  Administered 2014-09-22: 40 mg via INTRAVENOUS
  Filled 2014-09-22: qty 40

## 2014-09-22 MED ORDER — ONDANSETRON HCL 4 MG PO TABS: 4.0000 mg | ORAL_TABLET | Freq: Four times a day (QID) | ORAL | Status: DC | PRN

## 2014-09-22 MED ORDER — GI COCKTAIL ~~LOC~~
30.0000 mL | Freq: Once | ORAL | Status: AC
  Administered 2014-09-22: 30 mL via ORAL
  Filled 2014-09-22: qty 30

## 2014-09-22 MED ORDER — SODIUM CHLORIDE 0.9 % IJ SOLN: 3.0000 mL | Freq: Two times a day (BID) | INTRAMUSCULAR | Status: DC

## 2014-09-22 MED ORDER — ONDANSETRON 8 MG PO TBDP
8.0000 mg | ORAL_TABLET | Freq: Once | ORAL | Status: AC
  Administered 2014-09-22: 8 mg via ORAL
  Filled 2014-09-22: qty 1

## 2014-09-22 NOTE — ED Notes (Signed)
PT stated he removed 4 ticks from his arms yesterday and since then c/o nausea/vomiting x3 today and generalized weakness, headache and with increased acid reflux this am.

## 2014-09-22 NOTE — ED Provider Notes (Signed)
CSN: [id number]     Arrival date & time 09/22/14  0945 History   First MD Initiated Contact with Patient 09/22/14 1046     Chief Complaint  Patient presents with  . Nausea      HPI  Pt was seen at 1100. Per pt, c/o gradual onset and persistence of constant multiple symptoms that began 2 days ago, as well as today. Pt c/o "heartburn" and nausea for the past 2 days; took a Tums and Pepcid AC without relief. Pt also c/o cough and runny/stuffy nose that started today. Pt states he "pulled 3 ticks off of me" 2 days ago and is concerned regarding that also. Pt is also requesting a work note for today. Denies CP/SOB, no back pain, no abd pain, no vomiting/diarrhea, no rash, no fevers.    Past Medical History  Diagnosis Date  . Hypertension   . Anxiety   . Depression   . Asthma   . Seizures     outgrew by age 18  . Chronic back pain   . Sleep apnea sleep apnea  . Hearing loss in left ear   . GERD (gastroesophageal reflux disease)    History reviewed. No pertinent past surgical history.   Family History  Problem Relation Age of Onset  . Cancer Mother   . Hypertension Mother   . Cancer Father   . Hypertension Father   . Heart disease Other     "before 38 years old"   History  Substance Use Topics  . Smoking status: Former Smoker -- 1.00 packs/day for 2 years    Types: Cigarettes    Quit date: 04/08/2012  . Smokeless tobacco: Never Used  . Alcohol Use: No    Review of Systems ROS: Statement: All systems negative except as marked or noted in the HPI; Constitutional: Negative for fever and chills. ; ; Eyes: Negative for eye pain, redness and discharge. ; ; ENMT: Negative for ear pain, hoarseness, sore throat. +nasal congestion, sinus pressure and rhinorrhea. ; ; Cardiovascular: Negative for chest pain, palpitations, diaphoresis, dyspnea and peripheral edema. ; ; Respiratory: +cough. Negative for wheezing and stridor. ; ; Gastrointestinal: +nausea, "heartburn." Negative for vomiting,  diarrhea, abdominal pain, blood in stool, hematemesis, jaundice and rectal bleeding. . ; ; Genitourinary: Negative for dysuria, flank pain and hematuria. ; ; Musculoskeletal: Negative for back pain and neck pain. Negative for swelling and trauma.; ; Skin: Negative for pruritus, rash, abrasions, blisters, bruising and skin lesion.; ; Neuro: Negative for headache, lightheadedness and neck stiffness. Negative for weakness, altered level of consciousness , altered mental status, extremity weakness, paresthesias, involuntary movement, seizure and syncope.      Allergies  Review of patient's allergies indicates no known allergies.  Home Medications   Prior to Admission medications   Medication Sig Start Date End Date Taking? Authorizing Provider  naproxen (NAPROSYN) 500 MG tablet Take 1 tablet (500 mg total) by mouth 2 (two) times daily. Patient not taking: Reported on 09/22/2014 08/29/14   Vanetta Mulders, MD  ranitidine (ZANTAC) 150 MG capsule Take 1 capsule (150 mg total) by mouth daily. Patient not taking: Reported on 08/29/2014 12/29/13   Teressa Lower, NP  zolpidem (AMBIEN) 5 MG tablet Take 1 tablet (5 mg total) by mouth at bedtime as needed for sleep. Patient not taking: Reported on 08/29/2014 10/24/13   [name], MD   BP 110/76 mmHg  Pulse 78  Temp(Src) 98.5 F (36.9 C) (Oral)  Resp 18  Ht  (1.803  m)  Wt 318 lb (144.244 kg)  BMI 44.37 kg/m2  SpO2 98% Physical Exam  1105: Physical examination:  Nursing notes reviewed; Vital signs and O2 SAT reviewed;  Constitutional: Well developed, Well nourished, Well hydrated, In no acute distress; Head:  Normocephalic, atraumatic; Eyes: EOMI, PERRL, No scleral icterus; ENMT: Mouth and pharynx normal, Mucous membranes moist; Neck: Supple, Full range of motion, No lymphadenopathy; Cardiovascular: Regular rate and rhythm, No murmur, rub, or gallop; Respiratory: Breath sounds clear & equal bilaterally, No rales, rhonchi, wheezes.  Speaking full  sentences with ease, Normal respiratory effort/excursion; Chest: Nontender, Movement normal; Abdomen: Soft, Nontender, Nondistended, Normal bowel sounds; Genitourinary: No CVA tenderness; Extremities: Pulses normal, No tenderness, No edema, No calf edema or asymmetry.; Neuro: AA&Ox3, Major CN grossly intact.  Speech clear. No gross focal motor or sensory deficits in extremities. Climbs on and off stretcher easily by himself. Gait steady.; Skin: Color normal, Warm, Dry.; Psych:  Affect flat.   ED Course  Procedures      EKG Interpretation   Date/Time:  Wednesday September 22 2014 10:36:04 EDT Ventricular Rate:  71 PR Interval:  168 QRS Duration: 108 QT Interval:  394 QTC Calculation: 428 R Axis:   123 Text Interpretation:  Normal sinus rhythm Possible Left atrial enlargement  Right axis deviation When compared with ECG of 02/19/2011 No significant  change was found Confirmed by [facility]  MD, Nicholos Johns [phone]) on 09/22/2014  11:09:31 AM      MDM  MDM Reviewed: previous chart, nursing note and vitals Reviewed previous: labs and ECG Interpretation: labs, ECG and x-ray      Results for orders placed or performed during the hospital encounter of 09/22/14  CBC with Differential  Result Value Ref Range   WBC 5.0 4.0 - 10.5 K/uL   RBC 5.20 4.22 - 5.81 MIL/uL   Hemoglobin 14.5 13.0 - 17.0 g/dL   HCT 26.9 48.5 - 46.2 %   MCV 84.6 78.0 - 100.0 fL   MCH 27.9 26.0 - 34.0 pg   MCHC 33.0 30.0 - 36.0 g/dL   RDW 70.3 50.0 - 93.8 %   Platelets 210 150 - 400 K/uL   Neutrophils Relative % 59 43 - 77 %   Neutro Abs 2.9 1.7 - 7.7 K/uL   Lymphocytes Relative 33 12 - 46 %   Lymphs Abs 1.6 0.7 - 4.0 K/uL   Monocytes Relative 8 3 - 12 %   Monocytes Absolute 0.4 0.1 - 1.0 K/uL   Eosinophils Relative 0 0 - 5 %   Eosinophils Absolute 0.0 0.0 - 0.7 K/uL   Basophils Relative 0 0 - 1 %   Basophils Absolute 0.0 0.0 - 0.1 K/uL  Comprehensive metabolic panel  Result Value Ref Range   Sodium 138 135 -  145 mmol/L   Potassium 3.7 3.5 - 5.1 mmol/L   Chloride 106 101 - 111 mmol/L   CO2 25 22 - 32 mmol/L   Glucose, Bld 96 65 - 99 mg/dL   BUN 19 6 - 20 mg/dL   Creatinine, Ser 1.82 0.61 - 1.24 mg/dL   Calcium 8.9 8.9 - 99.3 mg/dL   Total Protein 7.3 6.5 - 8.1 g/dL   Albumin 3.8 3.5 - 5.0 g/dL   AST 22 15 - 41 U/L   ALT 29 17 - 63 U/L   Alkaline Phosphatase 77 38 - 126 U/L   Total Bilirubin 0.6 0.3 - 1.2 mg/dL   GFR calc non Af Amer >60 >60 mL/min  GFR calc Af Amer >60 >60 mL/min   Anion gap 7 5 - 15  Lipase, blood  Result Value Ref Range   Lipase 20 (L) 22 - 51 U/L  Troponin I  Result Value Ref Range   Troponin I 0.04 (H) <0.031 ng/mL   Dg Chest 2 View 09/22/2014   CLINICAL DATA:  Nausea  EXAM: CHEST  2 VIEW  COMPARISON:  08/29/2014  FINDINGS: Borderline cardiomegaly, stable from prior. Normal aortic and hilar contours. There is no edema, consolidation, effusion, or pneumothorax.  Thoracic dextroscoliosis.  No acute osseous findings.  IMPRESSION: Stable exam.  No active cardiopulmonary disease.   Electronically Signed   By: Marnee Spring M.D.   On: 09/22/2014 11:46    1200:  Troponin elevated. No acute STTW changes on EKG. Denies CP currently. Pt states he has FHx early heart disease; will observation admit. T/C to [facility] Dr. Kerry Hough, case discussed, including:  HPI, pertinent PM/SHx, VS/PE, dx testing, ED course and treatment:  Agreeable to admit, requests to write temporary orders, obtain observation tele bed to Dr. Otilio Saber service   Samuel Jester, DO 09/23/14 1530

## 2014-09-22 NOTE — H&P (Signed)
[facility] History and Physical  Bobby Bridges YDS:[id number] DOB: 1975-12-22 DOA: 09/22/2014  Referring physician: Dr. Clarene Duke, ER physician PCP: No primary care provider on file.   Chief Complaint: Nausea  HPI: Bobby Bridges is a 39 y.o. male who presents to the emergency room after discovering tick bites yesterday. Patient reports that he only several ticks from his skin and feels that he may have been bitten. Since that time, he reports nausea, feeling unwell. He feels generally weak. He has vomited approximately 3 times since yesterday. He had one bowel movement yesterday. He has a mildly productive cough. Denies any shortness of breath, fever. He does describe some right-sided chest pain that has now resolved. He was evaluated in the emergency room where chest x-ray did not show any acute findings, EKG did not show any acute findings, troponin was mildly elevated at 0.04 and therefore he's been referred for admission.   Review of Systems:  Pertinent positives as per history of present illness, otherwise negative   Past Medical History  Diagnosis Date  . Hypertension   . Anxiety   . Depression   . Asthma   . Seizures     outgrew by age 38  . Chronic back pain   . Sleep apnea sleep apnea  . Hearing loss in left ear   . GERD (gastroesophageal reflux disease)    History reviewed. No pertinent past surgical history. Social History:  reports that he quit smoking about 2 years ago. His smoking use included Cigarettes. He has a 2 pack-year smoking history. He has never used smokeless tobacco. He reports that he does not drink alcohol or use illicit drugs.  No Known Allergies  Family History  Problem Relation Age of Onset  . Cancer Mother   . Hypertension Mother   . Cancer Father   . Hypertension Father   . Heart disease Other     "before 38 years old"    Prior to Admission medications   Medication Sig Start Date End Date Taking? Authorizing Provider    naproxen (NAPROSYN) 500 MG tablet Take 1 tablet (500 mg total) by mouth 2 (two) times daily. Patient not taking: Reported on 09/22/2014 08/29/14   Vanetta Mulders, MD  ranitidine (ZANTAC) 150 MG capsule Take 1 capsule (150 mg total) by mouth daily. Patient not taking: Reported on 08/29/2014 12/29/13   Teressa Lower, NP  zolpidem (AMBIEN) 5 MG tablet Take 1 tablet (5 mg total) by mouth at bedtime as needed for sleep. Patient not taking: Reported on 08/29/2014 10/24/13   [name], MD   Physical Exam: Filed Vitals:   09/22/14 0957 09/22/14 1338 09/22/14 1404  BP: 110/76 112/85 136/81  Pulse: 78 80 57  Temp: 98.5 F (36.9 C) 98.2 F (36.8 C) 97.7 F (36.5 C)  TempSrc: Oral Oral Oral  Resp: 18 16 18   Height: 5\' 11"  (1.803 m)    Weight: 144.244 kg (318 lb)  143.836 kg (317 lb 1.6 oz)  SpO2: 98% 99% 100%    Wt Readings from Last 3 Encounters:  09/22/14 143.836 kg (317 lb 1.6 oz)  08/29/14 144.607 kg (318 lb 12.8 oz)  12/29/13 140.615 kg (310 lb)    General:  Appears calm and comfortable Eyes: PERRL, normal lids, irises & conjunctiva ENT: grossly normal hearing, lips & tongue Neck: no LAD, masses or thyromegaly Cardiovascular: RRR, no m/r/g. No LE edema. Telemetry: SR, no arrhythmias  Respiratory: CTA bilaterally, no w/r/r. Normal respiratory effort. Abdomen: soft, ntnd Skin: No  areas of infection, target lesions or any other rash noted Musculoskeletal: grossly normal tone BUE/BLE Psychiatric: grossly normal mood and affect, speech fluent and appropriate Neurologic: grossly non-focal.          Labs on Admission:  Basic Metabolic Panel:  Recent Labs Lab 09/22/14 1004  NA 138  K 3.7  CL 106  CO2 25  GLUCOSE 96  BUN 19  CREATININE 0.98  CALCIUM 8.9   Liver Function Tests:  Recent Labs Lab 09/22/14 1004  AST 22  ALT 29  ALKPHOS 77  BILITOT 0.6  PROT 7.3  ALBUMIN 3.8    Recent Labs Lab 09/22/14 1004  LIPASE 20*   No results for input(s): AMMONIA in  the last 168 hours. CBC:  Recent Labs Lab 09/22/14 1004  WBC 5.0  NEUTROABS 2.9  HGB 14.5  HCT 44.0  MCV 84.6  PLT 210   Cardiac Enzymes:  Recent Labs Lab 09/22/14 1004  TROPONINI 0.04*    BNP (last 3 results) No results for input(s): BNP in the last 8760 hours.  ProBNP (last 3 results) No results for input(s): PROBNP in the last 8760 hours.  CBG: No results for input(s): GLUCAP in the last 168 hours.  Radiological Exams on Admission: Dg Chest 2 View  09/22/2014   CLINICAL DATA:  Nausea  EXAM: CHEST  2 VIEW  COMPARISON:  08/29/2014  FINDINGS: Borderline cardiomegaly, stable from prior. Normal aortic and hilar contours. There is no edema, consolidation, effusion, or pneumothorax.  Thoracic dextroscoliosis.  No acute osseous findings.  IMPRESSION: Stable exam.  No active cardiopulmonary disease.   Electronically Signed   By: Marnee Spring M.D.   On: 09/22/2014 11:46    EKG: Independently reviewed. No acute changes  Assessment/Plan Principal Problem:   Elevated troponin Active Problems:   GERD   Nausea   Tick bite   1. Elevated troponin. Unclear significance since patient does not have any EKG changes or typical chest pain. He does have a family history of premature coronary disease. His sister reports that he has high blood pressure hyperlipidemia, although he is not on any medications for this. Blood pressure most far has been in normal range. We will continue to monitor. Cycle cardiac markers. If cardiac markers remain negative, he can likely discharge home tomorrow. 2. Tick bite. No evidence of cellulitis or active infection. Will hold off on antibiotic soak this time. Continue observation. 3. GERD. Unclear the cause of nausea. Continue antiemetics and started on proton pump inhibitors.    Code Status: full code DVT Prophylaxis: lovenox Family Communication: discussed with sister over the phone Disposition Plan: discharge home once improved  Time spent:   [facility] Pager [phone]

## 2014-09-22 NOTE — ED Notes (Signed)
Report given to floor nurse, all questions answered  

## 2014-09-22 NOTE — ED Notes (Addendum)
Pt reports got two ticks off of his left forearm and one off of chest 2 days ago.  Reports nausea, heart burn,  and headache since then.    Has been tried tums, rolaids, and pepcid AC but no relief. Pt also says has had cough and nasal congestion that started today.  Denies fever.

## 2014-09-22 NOTE — Care Management Note (Signed)
Case Management Note  Patient Details  Name: Bobby Bridges MRM: [id number] Date of Birth: 09-13-1975  Subjective/Objective:                  Pt admitted from home with nausea and "heartburn". Pt lives with his wife and will return home at discharge. Pt is independent with ADL's.  Action/Plan: Financial counselor is aware of pt self pay status. Pt is asking for assistance with obtaining medical and dental insurance. May need MATCH at discharge. Will continue to follow for discharge planning needs.  Expected Discharge Date:                  Expected Discharge Plan:  Home/Self Care  In-House Referral:  Financial Counselor  Discharge planning Services  CM Consult  Post Acute Care Choice:    Choice offered to:     DME Arranged:    DME Agency:     HH Arranged:    HH Agency:     Status of Service:  In process, will continue to follow  Medicare Important Message Given:    Date Medicare IM Given:    Medicare IM give by:    Date Additional Medicare IM Given:    Additional Medicare Important Message give by:     If discussed at Long Length of Stay Meetings, dates discussed:    Additional Comments:  Cheryl Flash, RN 09/22/2014, 3:08 PM

## 2014-09-22 NOTE — Clinical Social Work Note (Signed)
CSW received consult for assistance with PCP and medications. CM notified. CSW will sign off, but can be reconsulted if needed.  Derenda Fennel, LCSW [phone]

## 2014-09-23 DIAGNOSIS — K219 Gastro-esophageal reflux disease without esophagitis: Secondary | ICD-10-CM | POA: Diagnosis not present

## 2014-09-23 DIAGNOSIS — R110 Nausea: Secondary | ICD-10-CM | POA: Diagnosis not present

## 2014-09-23 DIAGNOSIS — R7989 Other specified abnormal findings of blood chemistry: Secondary | ICD-10-CM | POA: Diagnosis not present

## 2014-09-23 DIAGNOSIS — T148 Other injury of unspecified body region: Secondary | ICD-10-CM | POA: Diagnosis not present

## 2014-09-23 LAB — TROPONIN I: Troponin I: 0.03 ng/mL (ref <0.031)

## 2014-09-23 MED ORDER — PANTOPRAZOLE SODIUM 40 MG PO TBEC: 40.0000 mg | DELAYED_RELEASE_TABLET | Freq: Every day | ORAL | Status: DC

## 2014-09-23 NOTE — Progress Notes (Signed)
Discharge instructions given to patient and pt's wife.  Both verbalized understanding of discharge instructions.  Pt discharge home with wife in stable condition.

## 2014-09-23 NOTE — Discharge Summary (Signed)
Physician Discharge Summary  ULAS ZUERCHER VQQ:[id number] DOB: 29-Sep-1975 DOA: 09/22/2014  PCP: No primary care provider on file.  Admit date: 09/22/2014 Discharge date: 09/23/2014  Time spent: 40 minutes  Recommendations for Outpatient Follow-up:  1. Follow up with primary care physician in 1-2 weeks  Discharge Diagnoses:  Principal Problem:   Elevated troponin Active Problems:   GERD   Nausea   Tick bite   Discharge Condition: improved  Diet recommendation: low salt  Filed Weights   09/22/14 0957 09/22/14 1404  Weight: 144.244 kg (318 lb) 143.836 kg (317 lb 1.6 oz)    History of present illness:  Patient was admitted to the hospital with nausea. Workup in the emergency room indicated mildly elevated troponin without any EKG changes. He was admitted to the hospital overnight for observation.  Hospital Course:  Patient was advised the hospital overnight. He ruled out for ACS with negative cardiac markers. He did not have any further chest pain or nausea. I suspect that his nausea may be related to GERD. We'll continue him on Protonix. He does not have any other significant risk factors for coronary disease. Would not pursue further workup at this time as his symptoms recur. He does not have any evidence of active infection after his tick bites. Would hold off on antibiotic since this time. Patient is stable for discharge.  Procedures:    Consultations:    Discharge Exam: Filed Vitals:   09/23/14 0607  BP: 111/65  Pulse: 62  Temp: 97.5 F (36.4 C)  Resp: 15    General: NAD Cardiovascular: S1, S2 RRR Respiratory: CTA B  Discharge Instructions   Discharge Instructions    Diet - low sodium heart healthy    Complete by:  As directed      Increase activity slowly    Complete by:  As directed           Current Discharge Medication List    START taking these medications   Details  pantoprazole (PROTONIX) 40 MG tablet Take 1 tablet (40 mg total) by  mouth daily. Qty: 30 tablet, Refills: 1      STOP taking these medications     naproxen (NAPROSYN) 500 MG tablet      ranitidine (ZANTAC) 150 MG capsule      zolpidem (AMBIEN) 5 MG tablet        No Known Allergies    The results of significant diagnostics from this hospitalization (including imaging, microbiology, ancillary and laboratory) are listed below for reference.    Significant Diagnostic Studies: Dg Chest 2 View  09/22/2014   CLINICAL DATA:  Nausea  EXAM: CHEST  2 VIEW  COMPARISON:  08/29/2014  FINDINGS: Borderline cardiomegaly, stable from prior. Normal aortic and hilar contours. There is no edema, consolidation, effusion, or pneumothorax.  Thoracic dextroscoliosis.  No acute osseous findings.  IMPRESSION: Stable exam.  No active cardiopulmonary disease.   Electronically Signed   By: Marnee Spring M.D.   On: 09/22/2014 11:46   Dg Chest 2 View  08/29/2014   CLINICAL DATA:  Substernal chest pain x1 week  EXAM: CHEST  2 VIEW  COMPARISON:  10/23/2013  FINDINGS: Lungs are clear.  No pleural effusion or pneumothorax.  The heart is normal in size.  Visualized osseous structures are within normal limits.  IMPRESSION: No evidence of acute cardiopulmonary disease.   Electronically Signed   By: Charline Bills M.D.   On: 08/29/2014 11:06    Microbiology: No results found for this  or any previous visit (from the past 240 hour(s)).   Labs: Basic Metabolic Panel:  Recent Labs Lab 09/22/14 1004  NA 138  K 3.7  CL 106  CO2 25  GLUCOSE 96  BUN 19  CREATININE 0.98  CALCIUM 8.9   Liver Function Tests:  Recent Labs Lab 09/22/14 1004  AST 22  ALT 29  ALKPHOS 77  BILITOT 0.6  PROT 7.3  ALBUMIN 3.8    Recent Labs Lab 09/22/14 1004  LIPASE 20*   No results for input(s): AMMONIA in the last 168 hours. CBC:  Recent Labs Lab 09/22/14 1004  WBC 5.0  NEUTROABS 2.9  HGB 14.5  HCT 44.0  MCV 84.6  PLT 210   Cardiac Enzymes:  Recent Labs Lab 09/22/14 1004  09/22/14 1713 09/22/14 2231 09/23/14 0432  TROPONINI 0.04* 0.03 0.03 0.03   BNP: BNP (last 3 results) No results for input(s): BNP in the last 8760 hours.  ProBNP (last 3 results) No results for input(s): PROBNP in the last 8760 hours.  CBG: No results for input(s): GLUCAP in the last 168 hours.     Signed:  Jassmin Kemmerer  [facility] 09/23/2014, 10:34 AM

## 2014-09-23 NOTE — Care Management Note (Signed)
Case Management Note  Patient Details  Name: DAJAUN LONG MRN: [id number] Date of Birth: Jul 09, 1975  Subjective/Objective:                    Action/Plan:   Expected Discharge Date:                  Expected Discharge Plan:  Home/Self Care  In-House Referral:  Financial Counselor  Discharge planning Services  CM Consult, MATCH Program, Follow-up appt scheduled  Post Acute Care Choice:  NA Choice offered to:  NA  DME Arranged:    DME Agency:     HH Arranged:    HH Agency:     Status of Service:  Completed, signed off  Medicare Important Message Given:    Date Medicare IM Given:    Medicare IM give by:    Date Additional Medicare IM Given:    Additional Medicare Important Message give by:     If discussed at Long Length of Stay Meetings, dates discussed:    Additional Comments: Pt discharged home today. PCP appt made with [facility] and informed pt as well as made pt aware. Pt also given MATCH voucher and pt verbalized understanding of the qualifications for voucher. Pt and pts nurse aware of discharge instructions. Arlyss Queen [place], RN 09/23/2014, 11:21 AM

## 2014-09-27 ENCOUNTER — Emergency Department (HOSPITAL_COMMUNITY)
Admission: EM | Admit: 2014-09-27 | Discharge: 2014-09-27 | Disposition: A | Discharge status: Home / Self Care | Payer: BLUE CROSS/BLUE SHIELD | Attending: Emergency Medicine | Admitting: Emergency Medicine

## 2014-09-27 ENCOUNTER — Encounter (HOSPITAL_COMMUNITY): Payer: Self-pay | Admitting: Emergency Medicine

## 2014-09-27 DIAGNOSIS — K219 Gastro-esophageal reflux disease without esophagitis: Secondary | ICD-10-CM | POA: Insufficient documentation

## 2014-09-27 DIAGNOSIS — K5900 Constipation, unspecified: Secondary | ICD-10-CM

## 2014-09-27 DIAGNOSIS — R34 Anuria and oliguria: Secondary | ICD-10-CM | POA: Insufficient documentation

## 2014-09-27 DIAGNOSIS — Z8669 Personal history of other diseases of the nervous system and sense organs: Secondary | ICD-10-CM | POA: Insufficient documentation

## 2014-09-27 DIAGNOSIS — J45909 Unspecified asthma, uncomplicated: Secondary | ICD-10-CM | POA: Insufficient documentation

## 2014-09-27 DIAGNOSIS — M659 Synovitis and tenosynovitis, unspecified: Secondary | ICD-10-CM

## 2014-09-27 DIAGNOSIS — M65949 Unspecified synovitis and tenosynovitis, unspecified hand: Secondary | ICD-10-CM

## 2014-09-27 DIAGNOSIS — Z8659 Personal history of other mental and behavioral disorders: Secondary | ICD-10-CM | POA: Insufficient documentation

## 2014-09-27 DIAGNOSIS — G8929 Other chronic pain: Secondary | ICD-10-CM | POA: Insufficient documentation

## 2014-09-27 DIAGNOSIS — I10 Essential (primary) hypertension: Secondary | ICD-10-CM | POA: Insufficient documentation

## 2014-09-27 DIAGNOSIS — Z79899 Other long term (current) drug therapy: Secondary | ICD-10-CM | POA: Insufficient documentation

## 2014-09-27 DIAGNOSIS — M6588 Other synovitis and tenosynovitis, other site: Secondary | ICD-10-CM | POA: Insufficient documentation

## 2014-09-27 LAB — URINALYSIS, ROUTINE W REFLEX MICROSCOPIC
BILIRUBIN URINE: NEGATIVE
Glucose, UA: NEGATIVE mg/dL
Hgb urine dipstick: NEGATIVE
KETONES UR: NEGATIVE mg/dL
Leukocytes, UA: NEGATIVE
NITRITE: NEGATIVE
PROTEIN: NEGATIVE mg/dL
Specific Gravity, Urine: 1.025 (ref 1.005–1.030)
Urobilinogen, UA: 0.2 mg/dL (ref 0.0–1.0)
pH: 5.5 (ref 5.0–8.0)

## 2014-09-27 MED ORDER — DOCUSATE SODIUM 100 MG PO CAPS: 100.0000 mg | ORAL_CAPSULE | Freq: Two times a day (BID) | ORAL | Status: DC

## 2014-09-27 MED ORDER — POLYETHYLENE GLYCOL 3350 17 GM/SCOOP PO POWD: 17.0000 g | Freq: Two times a day (BID) | ORAL | Status: DC

## 2014-09-27 MED ORDER — MAGNESIUM CITRATE PO SOLN
1.0000 | Freq: Once | ORAL | Status: AC
  Administered 2014-09-27: 1 via ORAL
  Filled 2014-09-27: qty 296

## 2014-09-27 MED ORDER — IBUPROFEN 800 MG PO TABS: 800.0000 mg | ORAL_TABLET | Freq: Three times a day (TID) | ORAL | Status: DC

## 2014-09-27 NOTE — ED Provider Notes (Signed)
CSN: [id number]     Arrival date & time 09/27/14  9147 History  This chart was scribed for Eber Hong, MD by Placido Sou, ED scribe. This patient was seen in room APA14/APA14 and the patient's care was started at 9:03 AM.    Chief Complaint  Patient presents with  . Wrist Pain  . Constipation    The history is provided by the patient. No language interpreter was used.    HPI Comments: Bobby Bridges is a 39 y.o. male who presents to the Emergency Department with multiple complaints including constipation, right wrist pain and trouble urinating.   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 His chronic wrist pain is exacerbated with movement of the right hand located along extensor tendon of the right thumb.   Pt notes his last normal BM was 3 days ago and was "hard as a rock". He notes normally having 2-3 BM's per day.   Pt additionally complains of trouble urinating and notes he sometimes has a feeling of incontinence. He is unsure of possibly having an STD or whether his urine is abnormally thick.   He denies any drug abuse, fevers, chills, nausea, vomiting, dysuria or bloody stools.   PCP: None   Past Medical History  Diagnosis Date  . Hypertension   . Anxiety   . Depression   . Asthma   . Seizures     outgrew by age 46  . Chronic back pain   . Sleep apnea sleep apnea  . Hearing loss in left ear   . GERD (gastroesophageal reflux disease)    History reviewed. No pertinent past surgical history. Family History  Problem Relation Age of Onset  . Cancer Mother   . Hypertension Mother   . Cancer Father   . Hypertension Father   . Heart disease Other     "before 39 years old"   History  Substance Use Topics  . Smoking status: Former Smoker -- 1.00 packs/day for 2 years    Types: Cigarettes    Quit date: 04/08/2012  . Smokeless tobacco: Never Used   . Alcohol Use: No    Review of Systems  Gastrointestinal: Positive for constipation. Negative for nausea, vomiting, blood in stool and anal bleeding.  Genitourinary: Positive for decreased urine volume. Negative for dysuria.  Musculoskeletal: Positive for myalgias and arthralgias.  All other systems reviewed and are negative.     Allergies  Review of patient's allergies indicates no known allergies.  Home Medications   Prior to Admission medications   Medication Sig Start Date End Date Taking? Authorizing Provider  docusate sodium (COLACE) 100 MG capsule Take 1 capsule (100 mg total) by mouth every 12 (twelve) hours. 09/27/14   Eber Hong, MD  ibuprofen (ADVIL,MOTRIN) 800 MG tablet Take 1 tablet (800 mg total) by mouth 3 (three) times daily. 09/27/14   Eber Hong, MD  pantoprazole (PROTONIX) 40 MG tablet Take 1 tablet (40 mg total) by mouth daily. Patient not taking: Reported on 09/27/2014 09/23/14   Erick Blinks, MD  polyethylene glycol powder (GLYCOLAX/MIRALAX) powder Take 17 g by mouth 2 (two) times daily. Until daily soft stools  OTC 09/27/14   Eber Hong, MD   BP 115/75 mmHg  Pulse 78  Temp(Src) 98.9 F (37.2 C) (Oral)  Resp 18  Ht  (1.803 m)  Wt 317  lb (143.79 kg)  BMI 44.23 kg/m2  SpO2 97% Physical Exam  Constitutional: He appears well-developed and well-nourished. No distress.  HENT:  Head: Normocephalic and atraumatic.  Mouth/Throat: Oropharynx is clear and moist. No oropharyngeal exudate.  Eyes: Conjunctivae and EOM are normal. Pupils are equal, round, and reactive to light. Right eye exhibits no discharge. Left eye exhibits no discharge. No scleral icterus.  Neck: Normal range of motion. Neck supple. No JVD present. No thyromegaly present.  Cardiovascular: Normal rate, regular rhythm, normal heart sounds and intact distal pulses.   Abdominal: Soft. Bowel sounds are normal. He exhibits no distension and no mass. There is no tenderness.  Mild  tenderness LLQ;   Genitourinary:  No abrasions, lesions, or discharge; circumcised  Musculoskeletal: Normal range of motion. He exhibits no edema or tenderness.  Positive Finkelstein test on the right  Lymphadenopathy:    He has no cervical adenopathy.  Neurological: He is alert. Coordination normal.  Skin: Skin is warm and dry. No rash noted. No erythema.  Psychiatric: He has a normal mood and affect. His behavior is normal.  Nursing note and vitals reviewed.   ED Course  Procedures  DIAGNOSTIC STUDIES: Oxygen Saturation is 97% on RA, normal by my interpretation.    COORDINATION OF CARE: 9:08 AM Discussed treatment plan with pt at bedside and pt agreed to plan.  Labs Review Labs Reviewed  URINALYSIS, ROUTINE W REFLEX MICROSCOPIC (NOT AT [facility])  GC/CHLAMYDIA PROBE AMP ([place]) NOT AT [facility]    Imaging Review No results found.   MDM   Final diagnoses:  Tenosynovitis of thumb  Constipation, unspecified constipation type    The pt was given Mag citrate for constipation, he has normal UA, GC sent, but no signs of STD, VS normal - home with following meds - likely has tendinitis of the thumb.  Meds given in ED:  Medications  magnesium citrate solution 1 Bottle (1 Bottle Oral Given 09/27/14 0912)    New Prescriptions   DOCUSATE SODIUM (COLACE) 100 MG CAPSULE    Take 1 capsule (100 mg total) by mouth every 12 (twelve) hours.   IBUPROFEN (ADVIL,MOTRIN) 800 MG TABLET    Take 1 tablet (800 mg total) by mouth 3 (three) times daily.   POLYETHYLENE GLYCOL POWDER (GLYCOLAX/MIRALAX) POWDER    Take 17 g by mouth 2 (two) times daily. Until daily soft stools  OTC   I personally performed the services described in this documentation, which was scribed in my presence. The recorded information has been reviewed and is accurate.      Eber Hong, MD 09/27/14 [phone]

## 2014-09-27 NOTE — Discharge Instructions (Signed)
Please call your doctor for a followup appointment within 24-48 hours. When you talk to your doctor please let them know that you were seen in the emergency department and have them acquire all of your records so that they can discuss the findings with you and formulate a treatment plan to fully care for your new and ongoing problems. ° °[facility] Doctor List ° ° ° °Edward Hawkins MD. Specialty: Pulmonary Disease Contact information: [address]  °[address]  °[place] [address]  °[phone]  ° °Margaret Simpson, MD. Specialty: Family Medicine Contact information: [address]  °[place] [address]  °[phone]  ° °Scott Luking, MD. Specialty: Family Medicine Contact information: [address]  °[address]  °[place] [address]  °[phone]  ° °Tesfaye Fanta, MD Specialty: Internal Medicine Contact information: [address]  °[place] [address]  °[phone]  ° °Zach Hall, MD. Specialty: Internal Medicine Contact information: [address]  °[place] [address]  °[phone]  ° °Angus Mcinnis, MD. Specialty: Family Medicine Contact information: [address]  °[place] [address]  °[phone]  ° °Stephen Knowlton, MD. Specialty: Family Medicine Contact information: [address]  °[address]  °[place] [address]  °[phone]  ° °Roy Fagan, MD. Specialty: Internal Medicine Contact information: [address]  °[address]  °  [address]  °[phone]  ° ° °

## 2014-09-27 NOTE — ED Notes (Signed)
Pt made aware to return if symptoms worsen or if any life threatening symptoms occur.   

## 2014-09-27 NOTE — ED Notes (Signed)
Pt reports right wrist pain x1 year of unknown cause and has not been seen for this issue.  Pt reports he does not have a primary doctor to see about this.  Pt also reports constipation x3 days and last bm was 3 days ago and was "hard as a rock." pt alert and oriented.

## 2014-09-28 LAB — GC/CHLAMYDIA PROBE AMP (~~LOC~~) NOT AT ARMC
Chlamydia: NEGATIVE
NEISSERIA GONORRHEA: NEGATIVE

## 2014-10-07 ENCOUNTER — Emergency Department (HOSPITAL_COMMUNITY): Payer: Self-pay

## 2014-10-07 ENCOUNTER — Emergency Department (HOSPITAL_COMMUNITY)
Admission: EM | Admit: 2014-10-07 | Discharge: 2014-10-07 | Disposition: A | Discharge status: Home / Self Care | Payer: Self-pay | Attending: Emergency Medicine | Admitting: Emergency Medicine

## 2014-10-07 ENCOUNTER — Encounter (HOSPITAL_COMMUNITY): Payer: Self-pay | Admitting: Emergency Medicine

## 2014-10-07 DIAGNOSIS — Z791 Long term (current) use of non-steroidal anti-inflammatories (NSAID): Secondary | ICD-10-CM | POA: Insufficient documentation

## 2014-10-07 DIAGNOSIS — S92521A Displaced fracture of medial phalanx of right lesser toe(s), initial encounter for closed fracture: Secondary | ICD-10-CM | POA: Insufficient documentation

## 2014-10-07 DIAGNOSIS — Y9289 Other specified places as the place of occurrence of the external cause: Secondary | ICD-10-CM | POA: Insufficient documentation

## 2014-10-07 DIAGNOSIS — R1110 Vomiting, unspecified: Secondary | ICD-10-CM | POA: Insufficient documentation

## 2014-10-07 DIAGNOSIS — Z8659 Personal history of other mental and behavioral disorders: Secondary | ICD-10-CM | POA: Insufficient documentation

## 2014-10-07 DIAGNOSIS — S92911A Unspecified fracture of right toe(s), initial encounter for closed fracture: Secondary | ICD-10-CM

## 2014-10-07 DIAGNOSIS — Y998 Other external cause status: Secondary | ICD-10-CM | POA: Insufficient documentation

## 2014-10-07 DIAGNOSIS — Y9389 Activity, other specified: Secondary | ICD-10-CM | POA: Insufficient documentation

## 2014-10-07 DIAGNOSIS — I10 Essential (primary) hypertension: Secondary | ICD-10-CM | POA: Insufficient documentation

## 2014-10-07 DIAGNOSIS — H9192 Unspecified hearing loss, left ear: Secondary | ICD-10-CM | POA: Insufficient documentation

## 2014-10-07 DIAGNOSIS — J45909 Unspecified asthma, uncomplicated: Secondary | ICD-10-CM | POA: Insufficient documentation

## 2014-10-07 DIAGNOSIS — W228XXA Striking against or struck by other objects, initial encounter: Secondary | ICD-10-CM | POA: Insufficient documentation

## 2014-10-07 DIAGNOSIS — R519 Headache, unspecified: Secondary | ICD-10-CM

## 2014-10-07 DIAGNOSIS — Z87891 Personal history of nicotine dependence: Secondary | ICD-10-CM | POA: Insufficient documentation

## 2014-10-07 DIAGNOSIS — K219 Gastro-esophageal reflux disease without esophagitis: Secondary | ICD-10-CM | POA: Insufficient documentation

## 2014-10-07 DIAGNOSIS — R51 Headache: Secondary | ICD-10-CM | POA: Insufficient documentation

## 2014-10-07 DIAGNOSIS — G8929 Other chronic pain: Secondary | ICD-10-CM | POA: Insufficient documentation

## 2014-10-07 DIAGNOSIS — R059 Cough, unspecified: Secondary | ICD-10-CM

## 2014-10-07 DIAGNOSIS — Z79899 Other long term (current) drug therapy: Secondary | ICD-10-CM | POA: Insufficient documentation

## 2014-10-07 DIAGNOSIS — R05 Cough: Secondary | ICD-10-CM | POA: Insufficient documentation

## 2014-10-07 MED ORDER — HYDROCODONE-ACETAMINOPHEN 5-325 MG PO TABS
1.0000 | ORAL_TABLET | Freq: Once | ORAL | Status: AC
  Administered 2014-10-07: 1 via ORAL
  Filled 2014-10-07: qty 1

## 2014-10-07 MED ORDER — HYDROCODONE-ACETAMINOPHEN 5-325 MG PO TABS: 1.0000 | ORAL_TABLET | Freq: Four times a day (QID) | ORAL | Status: DC | PRN

## 2014-10-07 NOTE — Discharge Instructions (Signed)
Follow-up with orthopedics keep the the injured toe buddy taped to the toe next to it. Use the postop shoe. Take pain medicine as directed. Work note provided to give you a chance to adapt to your injury. Return for any new or worse symptoms.

## 2014-10-07 NOTE — ED Provider Notes (Addendum)
CSN: [id number]     Arrival date & time 10/07/14  0944 History  This chart was scribed for Bobby MuldersScott Pam Vanalstine, MD by Placido SouLogan Joldersma, ED scribe. This patient was seen in room APA19/APA19 and the patient's care was started at 10:24 AM.    Chief Complaint  Patient presents with  . Toe Pain  . Headache    for one week   Patient is a 39 y.o. male presenting with headaches. The history is provided by the patient. No language interpreter was used.  Headache Associated symptoms: back pain, cough, myalgias and vomiting   Associated symptoms: no abdominal pain, no congestion, no diarrhea, no fever, no nausea and no sore throat     HPI Comments: Bobby Bridges is a 39 y.o. male who presents to the Emergency Department complaining of constant, moderate, right toe pain and swelling with onset a week ago. Pt is unsure of any trauma to the area and would rate the pain as an 8/10. He notes a worsening symptoms when bearing weight or ambulating. Pt notes reoccurring redness to his joints but is unsure of any history of gout.   Pt also notes a current, intermittent, migraine HA with onset 1 month ago and further notes a history of HA's. He currently rates the pain as 7/10. He notes 2x vomiting since onset as well as a productive cough and back pain.   He denies fever, chills, nausea, visual changes, runny nose, sore throat, SOB, abd pain, diarrhea, dysuria, leg swelling, rash, or bleeding easily.   Past Medical History  Diagnosis Date  . Hypertension   . Anxiety   . Depression   . Asthma   . Seizures     outgrew by age 39  . Chronic back pain   . Sleep apnea sleep apnea  . Hearing loss in left ear   . GERD (gastroesophageal reflux disease)    History reviewed. No pertinent past surgical history. Family History  Problem Relation Age of Onset  . Cancer Mother   . Hypertension Mother   . Cancer Father   . Hypertension Father   . Heart disease Other     "before 39 years old"   History   Substance Use Topics  . Smoking status: Former Smoker -- 1.00 packs/day for 2 years    Types: Cigarettes    Quit date: 04/08/2012  . Smokeless tobacco: Never Used  . Alcohol Use: No    Review of Systems  Constitutional: Negative for fever and chills.  HENT: Negative for congestion, rhinorrhea and sore throat.   Eyes: Negative for visual disturbance.  Respiratory: Positive for cough. Negative for shortness of breath.   Cardiovascular: Negative for leg swelling.  Gastrointestinal: Positive for vomiting. Negative for nausea, abdominal pain and diarrhea.  Genitourinary: Negative for dysuria.  Musculoskeletal: Positive for myalgias and back pain.  Skin: Negative for rash.  Neurological: Positive for headaches.  Hematological: Does not bruise/bleed easily.  Psychiatric/Behavioral: Negative for confusion.   Allergies  Review of patient's allergies indicates no known allergies.  Home Medications   Prior to Admission medications   Medication Sig Start Date End Date Taking? Authorizing Provider  docusate sodium (COLACE) 100 MG capsule Take 1 capsule (100 mg total) by mouth every 12 (twelve) hours. 09/27/14  Yes Eber HongBrian Miller, MD  ibuprofen (ADVIL,MOTRIN) 800 MG tablet Take 1 tablet (800 mg total) by mouth 3 (three) times daily. 09/27/14  Yes Eber HongBrian Miller, MD  polyethylene glycol powder (GLYCOLAX/MIRALAX) powder Take 17 g by mouth  2 (two) times daily. Until daily soft stools  OTC 09/27/14  Yes Eber Hong, MD  HYDROcodone-acetaminophen (NORCO/VICODIN) 5-325 MG per tablet Take 1-2 tablets by mouth every 6 (six) hours as needed. 10/07/14   Bobby Mulders, MD  pantoprazole (PROTONIX) 40 MG tablet Take 1 tablet (40 mg total) by mouth daily. Patient not taking: Reported on 09/27/2014 09/23/14   Erick Blinks, MD   BP 125/94 mmHg  Pulse 71  Temp(Src) 97.7 F (36.5 C) (Oral)  Resp 18  Ht  (1.803 m)  Wt 310 lb (140.615 kg)  BMI 43.26 kg/m2  SpO2 100% Physical Exam  Constitutional: He  is oriented to person, place, and time. He appears well-developed and well-nourished. No distress.  HENT:  Head: Normocephalic and atraumatic.  Mouth/Throat: Oropharynx is clear and moist.  Eyes: Conjunctivae and EOM are normal. Pupils are equal, round, and reactive to light. No scleral icterus.  Neck: Normal range of motion. Neck supple. No tracheal deviation present.  Cardiovascular: Normal rate, regular rhythm, normal heart sounds and intact distal pulses.   2+ DP pulses equal bilaterally  Pulmonary/Chest: Effort normal and breath sounds normal. No respiratory distress. He has no wheezes. He has no rales.  Abdominal: Soft. Bowel sounds are normal. There is no tenderness.  Musculoskeletal: Normal range of motion. He exhibits no edema.  No swelling of ankles bilaterally Cap refill of right big toe, 2nd and 3rd toe 1 sec  Neurological: He is alert and oriented to person, place, and time. No cranial nerve deficit. He exhibits normal muscle tone. Coordination normal.  Skin: Skin is warm and dry.  No lesions or redness of the right foot  Psychiatric: He has a normal mood and affect. His behavior is normal.  Nursing note and vitals reviewed.   ED Course  Procedures  DIAGNOSTIC STUDIES: Oxygen Saturation is 98% on RA, normal by my interpretation.    COORDINATION OF CARE: 10:31 AM Discussed treatment plan with pt at bedside and pt agreed to plan.  Labs Review Labs Reviewed - No data to display  Imaging Review Dg Chest 2 View  10/07/2014   CLINICAL DATA:  39 year old male with a history of pain and swelling in the second toe.  EXAM: CHEST - 2 VIEW  COMPARISON:  09/22/2014  FINDINGS: Cardiomediastinal silhouette projects within normal limits in size and contour. No confluent airspace disease, pneumothorax, or pleural effusion.  No displaced fracture.  Unremarkable appearance of the upper abdomen.  IMPRESSION: No radiographic evidence of acute cardiopulmonary disease.  Signed,  Yvone Neu.  Loreta Ave, DO  [facility]  [facility]   Electronically Signed   By: Gilmer Mor D.O.   On: 10/07/2014 11:19   Ct Head Wo Contrast  10/07/2014   CLINICAL DATA:  Headache for 2 weeks.  Head pain all over.  EXAM: CT HEAD WITHOUT CONTRAST  TECHNIQUE: Contiguous axial images were obtained from the base of the skull through the vertex without intravenous contrast.  COMPARISON:  None.  FINDINGS: No evidence for acute infarction, hemorrhage, mass lesion, hydrocephalus, or extra-axial fluid. No atrophy or white matter disease. Intact calvarium. No acute sinus or mastoid disease.  IMPRESSION: Negative exam.   Electronically Signed   By: Elsie Stain M.D.   On: 10/07/2014 11:28   Dg Foot Complete Right  10/07/2014   CLINICAL DATA:  Patient hit second toe on side of couch 1 week prior. Persistent pain and swelling  EXAM: RIGHT FOOT COMPLETE - 3+ VIEW  COMPARISON:  Aug 24, 2013  FINDINGS: Frontal, oblique, and lateral views were obtained. There is a comminuted fracture of the distal aspect of the second middle phalanx with fracture fragments in near anatomic alignment. No other acute fracture. No dislocation. There is calcification in the medial aspect of the first IP joint, probably due to residua from old trauma.  There is slight osteoarthritic change in the first MTP joint. Other joint spaces appear normal. There is a spur arising from the inferior calcaneus.  IMPRESSION: Comminuted fracture distal aspect second middle phalanx with fracture fragments in near anatomic alignment. Probable residua of old trauma in the medial first IP joint. No dislocations. Inferior calcaneal spur. Mild osteoarthritic change first MTP joint.   Electronically Signed   By: Bretta Bang III M.D.   On: 10/07/2014 11:19     EKG Interpretation None      MDM   Final diagnoses:  Cough  Headache  Toe fracture, right, closed, initial encounter    Patient with fracture to the right  second toe treated with buddy tape follow-up with orthopedics. Chest x-rays negative despite the productive cough. Head CT negative for any significant abnormalities of the explained headache.  Patient will retreat treated with pain medicine for the toe postop shoe follow-up with orthopedics.  I personally performed the services described in this documentation, which was scribed in my presence. The recorded information has been reviewed and is accurate.     Bobby Mulders, MD 10/07/14 1331 right is where her crit compared so I guess what is worse on the problem is certainly-year-old Allergies results will ordered blood cultures but physically he is is a visiting it is at all Arrival  Bobby Mulders, MD 10/07/14 1335

## 2014-10-07 NOTE — ED Notes (Signed)
Hit 2nd toe to right foot 2 days ago and c/o headache for one week.  Rates pain 10/10 on both.  Have not taken any medications today.

## 2016-03-17 ENCOUNTER — Encounter (HOSPITAL_COMMUNITY): Payer: Self-pay | Admitting: *Deleted

## 2016-03-17 ENCOUNTER — Emergency Department (HOSPITAL_COMMUNITY): Payer: BLUE CROSS/BLUE SHIELD

## 2016-03-17 ENCOUNTER — Emergency Department (HOSPITAL_COMMUNITY)
Admission: EM | Admit: 2016-03-17 | Discharge: 2016-03-18 | Disposition: A | Discharge status: Home / Self Care | Payer: BLUE CROSS/BLUE SHIELD | Attending: Emergency Medicine | Admitting: Emergency Medicine

## 2016-03-17 DIAGNOSIS — I10 Essential (primary) hypertension: Secondary | ICD-10-CM | POA: Diagnosis not present

## 2016-03-17 DIAGNOSIS — R0602 Shortness of breath: Secondary | ICD-10-CM | POA: Diagnosis not present

## 2016-03-17 DIAGNOSIS — R110 Nausea: Secondary | ICD-10-CM | POA: Insufficient documentation

## 2016-03-17 DIAGNOSIS — Z79899 Other long term (current) drug therapy: Secondary | ICD-10-CM | POA: Insufficient documentation

## 2016-03-17 DIAGNOSIS — R0789 Other chest pain: Secondary | ICD-10-CM | POA: Diagnosis not present

## 2016-03-17 DIAGNOSIS — M7989 Other specified soft tissue disorders: Secondary | ICD-10-CM | POA: Insufficient documentation

## 2016-03-17 DIAGNOSIS — R5383 Other fatigue: Secondary | ICD-10-CM | POA: Insufficient documentation

## 2016-03-17 DIAGNOSIS — Z87891 Personal history of nicotine dependence: Secondary | ICD-10-CM | POA: Diagnosis not present

## 2016-03-17 DIAGNOSIS — J45909 Unspecified asthma, uncomplicated: Secondary | ICD-10-CM | POA: Insufficient documentation

## 2016-03-17 LAB — CBC
HCT: 43.9 % (ref 39.0–52.0)
Hemoglobin: 14.4 g/dL (ref 13.0–17.0)
MCH: 27.6 pg (ref 26.0–34.0)
MCHC: 32.8 g/dL (ref 30.0–36.0)
MCV: 84.3 fL (ref 78.0–100.0)
PLATELETS: 234 10*3/uL (ref 150–400)
RBC: 5.21 MIL/uL (ref 4.22–5.81)
RDW: 13.1 % (ref 11.5–15.5)
WBC: 6.6 10*3/uL (ref 4.0–10.5)

## 2016-03-17 LAB — I-STAT TROPONIN, ED: Troponin i, poc: 0 ng/mL (ref 0.00–0.08)

## 2016-03-17 LAB — BASIC METABOLIC PANEL
Anion gap: 6 (ref 5–15)
BUN: 19 mg/dL (ref 6–20)
CALCIUM: 8.9 mg/dL (ref 8.9–10.3)
CO2: 23 mmol/L (ref 22–32)
CREATININE: 1.05 mg/dL (ref 0.61–1.24)
Chloride: 106 mmol/L (ref 101–111)
GFR calc Af Amer: >60 mL/min (ref >60)
GLUCOSE: 99 mg/dL (ref 65–99)
Potassium: 3.6 mmol/L (ref 3.5–5.1)
SODIUM: 135 mmol/L (ref 135–145)

## 2016-03-17 MED ORDER — KETOROLAC TROMETHAMINE 30 MG/ML IJ SOLN
30.0000 mg | Freq: Once | INTRAMUSCULAR | Status: AC
  Administered 2016-03-17: 30 mg via INTRAVENOUS
  Filled 2016-03-17: qty 1

## 2016-03-17 NOTE — ED Provider Notes (Addendum)
[facility] DEPT Provider Note   CSN: [id number] Arrival date & time: 03/17/16  2223  By signing my name below, I, Alyssa GroveMartin Green, attest that this documentation has been prepared under the direction and in the presence of Devoria AlbeIva Mauricia Mertens, MD. Electronically Signed: Alyssa GroveMartin Green, ED Scribe. 03/17/16. 11:41 PM.  Time seen 23:20    History   Chief Complaint Chief Complaint  Patient presents with  . Chest Pain   The history is provided by the patient. No language interpreter was used.   HPI Comments: Bobby Bridges is a 40 y.o. male with PMHx of HTN and GERD who presents to the Emergency Department complaining of gradual onset, episodic, sharp, moderate left sided chest pain onset 11 AM this morning. Pt started working at 9 AM this morning and was pushing more shopping carts than usual. After 2 hours he started to have chest pain and feel out of breath while pushing carts. Pt states episodes last for 5 minutes before self resolving. Pain is exacerbated with physical exertion, coughing and movement. Pain occasionally radiates through to back. He states his last episode was at 6 PM tonight after returning home from work. He has had similar chest pain and was admitted to the hospital for elevated troponin levels 1 year ago. He did not receive any cardiac stents or cardiac follow up. He reports associated nausea, shortness of breath, back pain,  and fatigue. He has experienced left leg swelling for approximately 1 year and states that lying down does not reduce swelling. Pt notes that he experiences shortness of breath independent of chest pain "for a long time" and usually when he is pushing shopping carts. He did not receive any cardiac stents. Pt does not see currently see a cardiologist or PCP. He does not take any regular medications. Pt does not smoke or drink. He has been pushing shopping carts for approximately 1 year now. Pt denies vomiting and diaphoresis.  PCP Dr Felecia ShellingFanta first appt  12/26   Past Medical History:  Diagnosis Date  . Anxiety   . Asthma   . Chronic back pain   . Depression   . GERD (gastroesophageal reflux disease)   . Hearing loss in left ear   . Hypertension   . Seizures (HCC)    outgrew by age 40  . Sleep apnea sleep apnea    Patient Active Problem List   Diagnosis Date Noted  . Elevated troponin 09/22/2014  . Nausea 09/22/2014  . Tick bite 09/22/2014  . Major depression 02/04/2012  . Major depressive disorder, recurrent episode (HCC) 12/02/2011    Class: Acute  . REDNESS OR DISCHARGE OF EYE 02/03/2008  . ALOPECIA 02/03/2008  . HYPERLIPIDEMIA 06/27/2006  . ALLERGIC RHINITIS, SEASONAL 06/27/2006  . SLEEP APNEA 06/27/2006  . HYPERGLYCEMIA 06/27/2006  . OBESITY NOS 04/23/2006  . DEPRESSION 04/23/2006  . HYPERTENSION 04/23/2006  . ALLERGIC RHINITIS 04/23/2006  . BRONCHITIS NOS 04/23/2006  . ASTHMA 04/23/2006  . GERD 04/23/2006  . SEIZURE DISORDER 04/23/2006    History reviewed. No pertinent surgical history.     Home Medications    Prior to Admission medications   Medication Sig Start Date End Date Taking? Authorizing Provider  cyclobenzaprine (FLEXERIL) 5 MG tablet Take 1 tablet (5 mg total) by mouth 3 (three) times daily as needed. 03/18/16   Devoria AlbeIva Avigail Pilling, MD  docusate sodium (COLACE) 100 MG capsule Take 1 capsule (100 mg total) by mouth every 12 (twelve) hours. 09/27/14   Eber HongBrian Miller, MD  HYDROcodone-acetaminophen (NORCO/VICODIN)  5-325 MG per tablet Take 1-2 tablets by mouth every 6 (six) hours as needed. 10/07/14   Vanetta Mulders, MD  ibuprofen (ADVIL,MOTRIN) 800 MG tablet Take 1 tablet (800 mg total) by mouth 3 (three) times daily. 09/27/14   Eber Hong, MD  naproxen sodium (ANAPROX DS) 550 MG tablet Take 1 po BID with food prn pain 03/18/16   Devoria Albe, MD  pantoprazole (PROTONIX) 40 MG tablet Take 1 tablet (40 mg total) by mouth daily. Patient not taking: Reported on 09/27/2014 09/23/14   Erick Blinks, MD  polyethylene  glycol powder (GLYCOLAX/MIRALAX) powder Take 17 g by mouth 2 (two) times daily. Until daily soft stools  OTC 09/27/14   Eber Hong, MD    Family History Family History  Problem Relation Age of Onset  . Cancer Mother   . Hypertension Mother   . Cancer Father   . Hypertension Father   . Heart disease Other     "before 39 years old"    Social History Social History  Substance Use Topics  . Smoking status: Former Smoker    Packs/day: 1.00    Years: 2.00    Types: Cigarettes    Quit date: 04/08/2012  . Smokeless tobacco: Never Used  . Alcohol use No  employed   Allergies   Patient has no known allergies.   Review of Systems Review of Systems  Constitutional: Positive for fatigue. Negative for diaphoresis.  Respiratory: Positive for shortness of breath.   Cardiovascular: Positive for chest pain and leg swelling.  Gastrointestinal: Positive for nausea. Negative for vomiting.  All other systems reviewed and are negative.  Physical Exam Updated Vital Signs BP 124/94 (BP Location: Right Wrist)   Pulse 81   Resp (!) 30   Ht 6\' 1"  (1.854 m)   Wt (!) 310 lb (140.6 kg)   SpO2 98%   BMI 40.90 kg/m   Vital signs normal except for tachypneia   Physical Exam  Constitutional: He is oriented to person, place, and time. He appears well-developed and well-nourished.  Non-toxic appearance. He does not appear ill. No distress.  HENT:  Head: Normocephalic and atraumatic.  Right Ear: External ear normal.  Left Ear: External ear normal.  Nose: Nose normal. No mucosal edema or rhinorrhea.  Mouth/Throat: Oropharynx is clear and moist and mucous membranes are normal. No dental abscesses or uvula swelling.  Eyes: Conjunctivae are normal. Pupils are equal, round, and reactive to light.  Right eye deviates laterally and he states he sees out of his left eye best  Neck: Normal range of motion and full passive range of motion without pain. Neck supple.  Cardiovascular: Normal rate,  regular rhythm and normal heart sounds.  Exam reveals no gallop and no friction rub.   No murmur heard. Pulmonary/Chest: Effort normal and breath sounds normal. No respiratory distress. He has no wheezes. He has no rhonchi. He has no rales. He exhibits no tenderness and no crepitus.    Tender to palpation just left of the lower left costochondral junctions  Abdominal: Soft. Normal appearance and bowel sounds are normal. He exhibits no distension. There is no tenderness. There is no rebound and no guarding.  Musculoskeletal: Normal range of motion. He exhibits no edema or tenderness.  Moves all extremities well.   Neurological: He is alert and oriented to person, place, and time. He has normal strength. No cranial nerve deficit.  Skin: Skin is warm, dry and intact. No rash noted. No erythema. No pallor.  Psychiatric: He  has a normal mood and affect. His speech is normal and behavior is normal. His mood appears not anxious.  Nursing note and vitals reviewed.  ED Treatments / Results  DIAGNOSTIC STUDIES: Oxygen Saturation is 98% on RA, normal by my interpretation.      Labs (all labs ordered are listed, but only abnormal results are displayed) Results for orders placed or performed during the hospital encounter of 03/17/16  Basic metabolic panel  Result Value Ref Range   Sodium 135 135 - 145 mmol/L   Potassium 3.6 3.5 - 5.1 mmol/L   Chloride 106 101 - 111 mmol/L   CO2 23 22 - 32 mmol/L   Glucose, Bld 99 65 - 99 mg/dL   BUN 19 6 - 20 mg/dL   Creatinine, Ser 1.61 0.61 - 1.24 mg/dL   Calcium 8.9 8.9 - 09.6 mg/dL   GFR calc non Af Amer >60 >60 mL/min   GFR calc Af Amer >60 >60 mL/min   Anion gap 6 5 - 15  CBC  Result Value Ref Range   WBC 6.6 4.0 - 10.5 K/uL   RBC 5.21 4.22 - 5.81 MIL/uL   Hemoglobin 14.4 13.0 - 17.0 g/dL   HCT 04.5 40.9 - 81.1 %   MCV 84.3 78.0 - 100.0 fL   MCH 27.6 26.0 - 34.0 pg   MCHC 32.8 30.0 - 36.0 g/dL   RDW 91.4 78.2 - 95.6 %   Platelets 234 150 - 400  K/uL  Brain natriuretic peptide  Result Value Ref Range   B Natriuretic Peptide 22.0 0.0 - 100.0 pg/mL  I-stat troponin, ED  Result Value Ref Range   Troponin i, poc 0.00 0.00 - 0.08 ng/mL   Comment 3          I-stat troponin, ED  Result Value Ref Range   Troponin i, poc 0.00 0.00 - 0.08 ng/mL   Comment 3          I-stat troponin, ED  Result Value Ref Range   Troponin i, poc 0.00 0.00 - 0.08 ng/mL   Comment 3          I-stat troponin, ED  Result Value Ref Range   Troponin i, poc 0.01 0.00 - 0.08 ng/mL   Comment 3           Laboratory interpretation all normal     EKG  EKG Interpretation  Date/Time:  Saturday March 17 2016 22:40:35 EST Ventricular Rate:  80 PR Interval:    QRS Duration: 110 QT Interval:  370 QTC Calculation: 427 R Axis:   -59 Text Interpretation:  Sinus rhythm Left atrial enlargement Incomplete left bundle branch block No significant change since last tracing 22 Sep 2014 Confirmed by Jack Bolio  MD-I, Lakeidra Reliford ([id number]) on 03/17/2016 11:00:58 PM       Radiology Dg Chest 2 View  Result Date: 03/17/2016 CLINICAL DATA:  Initial evaluation for acute chest pain. EXAM: CHEST  2 VIEW COMPARISON:  Prior radiograph from 10/07/2014. FINDINGS: Stable cardiomegaly.  Mediastinal silhouette within normal limits. Lungs normally inflated. No focal infiltrate, pulmonary edema, or pleural effusion. No pneumothorax. No acute osseous abnormality. IMPRESSION: 1. No active cardiopulmonary disease. 2. Stable cardiomegaly without pulmonary edema. Electronically Signed   By: Rise Mu M.D.   On: 03/17/2016 23:36    Procedures Procedures (including critical care time)  Medications Ordered in ED Medications  ketorolac (TORADOL) 30 MG/ML injection 30 mg (30 mg Intravenous Given 03/17/16 2354)     Initial Impression /  Assessment and Plan / ED Course  I have reviewed the triage vital signs and the nursing notes.  Pertinent labs & imaging results that were available during  my care of the patient were reviewed by me and considered in my medical decision making (see chart for details).  Clinical Course    COORDINATION OF CARE: 11:29 PM Discussed treatment plan with pt at bedside which includes Toradol injection and cardiac monitoring and pt agreed to plan.  Recheck 01:30 AM pain is better, nurse drew the delta troponin too early, did not read the draw times, will draw again at 2 AM  2:30 AM patient's delta troponins are negative. He was sent home with atypical or chest wall pain.  Review of patient's admission in 2016 shows he had a barely positive troponin and his serial troponins were negative in the hospital. He had no further testing done to evaluate him for heart disease at that time.  Final Clinical Impressions(s) / ED Diagnoses   Final diagnoses:  Chest wall pain  Atypical chest pain    New Prescriptions New Prescriptions   CYCLOBENZAPRINE (FLEXERIL) 5 MG TABLET    Take 1 tablet (5 mg total) by mouth 3 (three) times daily as needed.   NAPROXEN SODIUM (ANAPROX DS) 550 MG TABLET    Take 1 po BID with food prn pain    Plan discharge  Devoria AlbeIva Katti Pelle, MD, FACEP  I personally performed the services described in this documentation, which was scribed in my presence. The recorded information has been reviewed and considered.  Devoria AlbeIva Gerard Bonus, MD, Concha PyoFACEP    Karishma Unrein, MD 03/18/16 [id number]    Devoria AlbeIva Malia Corsi, MD 03/18/16 [phone]

## 2016-03-17 NOTE — ED Notes (Signed)
Trop I stat result 0.00

## 2016-03-17 NOTE — ED Notes (Signed)
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

## 2016-03-17 NOTE — ED Notes (Signed)
Patient transported to X-ray 

## 2016-03-17 NOTE — ED Triage Notes (Signed)
Pt states chest pains that started this evening. Pt states pain are sharp & on & off.

## 2016-03-18 LAB — I-STAT TROPONIN, ED
TROPONIN I, POC: 0 ng/mL (ref 0.00–0.08)
TROPONIN I, POC: 0.01 ng/mL (ref 0.00–0.08)
Troponin i, poc: 0 ng/mL (ref 0.00–0.08)

## 2016-03-18 LAB — BRAIN NATRIURETIC PEPTIDE: B NATRIURETIC PEPTIDE 5: 22 pg/mL (ref 0.0–100.0)

## 2016-03-18 MED ORDER — CYCLOBENZAPRINE HCL 5 MG PO TABS: 5.0000 mg | ORAL_TABLET | Freq: Three times a day (TID) | ORAL | 0 refills | Status: DC | PRN

## 2016-03-18 MED ORDER — NAPROXEN SODIUM 550 MG PO TABS: ORAL_TABLET | ORAL | 0 refills | Status: DC

## 2016-03-18 NOTE — ED Notes (Signed)
Trop Istat result is 0,00 for 2nd trop

## 2016-03-18 NOTE — Discharge Instructions (Signed)
Use ice and heat over the painful area. Take the medications as prescribed. Recheck if you get worse.

## 2016-03-18 NOTE — ED Notes (Signed)
Pt will need a work note 

## 2016-03-18 NOTE — ED Notes (Signed)
Dr Kirtland BouchardK has ordered trop for after 1am- this RN drew early #2 and trop will need repeat

## 2016-03-18 NOTE — ED Notes (Signed)
Trop I stat result 0.01

## 2016-03-18 NOTE — ED Notes (Signed)
Trop drawn and running

## 2016-03-18 NOTE — ED Notes (Signed)
Snacks and cokes to bedside

## 2016-03-18 NOTE — ED Notes (Addendum)
Trop I Stat result trop 0.00 for 2nd trop

## 2016-03-18 NOTE — ED Notes (Signed)
Dr Kirtland BouchardK states will need trop at 0200

## 2016-03-18 NOTE — ED Notes (Signed)
Pt works at [employer] and request a work note

## 2016-04-02 ENCOUNTER — Emergency Department (HOSPITAL_COMMUNITY)
Admission: EM | Admit: 2016-04-02 | Discharge: 2016-04-02 | Disposition: A | Discharge status: Home / Self Care | Payer: BLUE CROSS/BLUE SHIELD | Attending: Emergency Medicine | Admitting: Emergency Medicine

## 2016-04-02 ENCOUNTER — Encounter (HOSPITAL_COMMUNITY): Payer: Self-pay | Admitting: Emergency Medicine

## 2016-04-02 ENCOUNTER — Emergency Department (HOSPITAL_COMMUNITY): Payer: BLUE CROSS/BLUE SHIELD

## 2016-04-02 DIAGNOSIS — I10 Essential (primary) hypertension: Secondary | ICD-10-CM | POA: Diagnosis not present

## 2016-04-02 DIAGNOSIS — Z79899 Other long term (current) drug therapy: Secondary | ICD-10-CM | POA: Diagnosis not present

## 2016-04-02 DIAGNOSIS — J45909 Unspecified asthma, uncomplicated: Secondary | ICD-10-CM | POA: Diagnosis not present

## 2016-04-02 DIAGNOSIS — M722 Plantar fascial fibromatosis: Secondary | ICD-10-CM | POA: Diagnosis not present

## 2016-04-02 DIAGNOSIS — Z87891 Personal history of nicotine dependence: Secondary | ICD-10-CM | POA: Insufficient documentation

## 2016-04-02 DIAGNOSIS — M25572 Pain in left ankle and joints of left foot: Secondary | ICD-10-CM | POA: Diagnosis present

## 2016-04-02 MED ORDER — IBUPROFEN 600 MG PO TABS: 600.0000 mg | ORAL_TABLET | Freq: Four times a day (QID) | ORAL | 0 refills | Status: DC | PRN

## 2016-04-02 NOTE — Discharge Instructions (Signed)
See your Physician for recheck as scheduled  

## 2016-04-02 NOTE — ED Provider Notes (Signed)
[facility] Provider Note   CSN: [id number] Arrival date & time: 04/02/16  1629  By signing my name below, I, Alyssa GroveMartin Green, attest that this documentation has been prepared under the direction and in the presence of Cheron SchaumannLeslie Sofia, [place]PA-C. Electronically Signed: Alyssa GroveMartin Green, ED Scribe. 04/02/16. 5:10 PM.  History   Chief Complaint Chief Complaint  Patient presents with  . Ankle Pain   The history is provided by the patient. No language interpreter was used.   HPI Comments: Bobby Bridges is a 40 y.o. male who presents to the Emergency Department complaining of gradual onset, constant, moderate left ankle pain onset a few days PTA. He states pain is worse with weight bearing and palpation. Pt denies recent known trauma or injury. He works by [employer]. He denies hx of ankle conditions. Pt has not recently started new activities or worn new shoes. Pt denies PMHx of Gout and DM. Pt has not seen PCP in 3 years, but has an upcoming appointment on 04/04/16.  Past Medical History:  Diagnosis Date  . Anxiety   . Asthma   . Chronic back pain   . Depression   . GERD (gastroesophageal reflux disease)   . Hearing loss in left ear   . Hypertension   . Seizures (HCC)    outgrew by age 40  . Sleep apnea sleep apnea    Patient Active Problem List   Diagnosis Date Noted  . Elevated troponin 09/22/2014  . Nausea 09/22/2014  . Tick bite 09/22/2014  . Major depression 02/04/2012  . Major depressive disorder, recurrent episode (HCC) 12/02/2011    Class: Acute  . REDNESS OR DISCHARGE OF EYE 02/03/2008  . ALOPECIA 02/03/2008  . HYPERLIPIDEMIA 06/27/2006  . ALLERGIC RHINITIS, SEASONAL 06/27/2006  . SLEEP APNEA 06/27/2006  . HYPERGLYCEMIA 06/27/2006  . OBESITY NOS 04/23/2006  . DEPRESSION 04/23/2006  . HYPERTENSION 04/23/2006  . ALLERGIC RHINITIS 04/23/2006  . BRONCHITIS NOS 04/23/2006  . ASTHMA 04/23/2006  . GERD 04/23/2006  . SEIZURE DISORDER 04/23/2006     History reviewed. No pertinent surgical history.     Home Medications    Prior to Admission medications   Medication Sig Start Date End Date Taking? Authorizing Provider  cyclobenzaprine (FLEXERIL) 5 MG tablet Take 1 tablet (5 mg total) by mouth 3 (three) times daily as needed. Patient not taking: Reported on 04/02/2016 03/18/16   Devoria AlbeIva Knapp, MD  pantoprazole (PROTONIX) 40 MG tablet Take 1 tablet (40 mg total) by mouth daily. Patient not taking: Reported on 09/27/2014 09/23/14   Erick BlinksJehanzeb Memon, MD    Family History Family History  Problem Relation Age of Onset  . Cancer Mother   . Hypertension Mother   . Cancer Father   . Hypertension Father   . Heart disease Other     "before 39 years old"    Social History Social History  Substance Use Topics  . Smoking status: Former Smoker    Packs/day: 1.00    Years: 2.00    Types: Cigarettes    Quit date: 04/08/2012  . Smokeless tobacco: Never Used  . Alcohol use No     Allergies   Patient has no known allergies.   Review of Systems Review of Systems  Constitutional: Negative for fever.  Musculoskeletal: Positive for arthralgias and joint swelling.     Physical Exam Updated Vital Signs BP 118/77 (BP Location: Left Arm)   Pulse 86   Temp 98.1 F (36.7 C) (Oral)   Resp 16  Ht 6\' 1"  (1.854 m)   SpO2 100%   Physical Exam  Constitutional: He is oriented to person, place, and time. He appears well-developed and well-nourished. He is active. No distress.  HENT:  Head: Normocephalic and atraumatic.  Eyes: Conjunctivae are normal.  Cardiovascular: Normal rate.   Pulmonary/Chest: Effort normal. No respiratory distress.  Musculoskeletal: Normal range of motion.  Tender midfoot with slight swelling to ankle Tender calcaneus Good pulses and FROM  Neurological: He is alert and oriented to person, place, and time.  Skin: Skin is warm and dry.  Psychiatric: He has a normal mood and affect. His behavior is normal.   Nursing note and vitals reviewed.  ED Treatments / Results  DIAGNOSTIC STUDIES: Oxygen Saturation is 100% on RA, normal by my interpretation.    COORDINATION OF CARE: 5:10 PM Discussed treatment plan with pt at bedside which includes Post op shoe and ace wrap and pt agreed to plan.  Labs (all labs ordered are listed, but only abnormal results are displayed) Labs Reviewed - No data to display  EKG  EKG Interpretation None       Radiology Dg Ankle Complete Left  Result Date: 04/02/2016 CLINICAL DATA:  Left ankle pain x1 week, swelling EXAM: LEFT ANKLE COMPLETE - 3+ VIEW COMPARISON:  12/28/2013 FINDINGS: No fracture or dislocation is seen. The joint spaces are preserved. Mild medial soft tissue swelling. Small plantar calcaneal enthesophyte. IMPRESSION: No fracture or dislocation is seen. Mild medial soft tissue swelling. Electronically Signed   By: Charline BillsSriyesh  Krishnan M.D.   On: 04/02/2016 17:00    Procedures Procedures (including critical care time)  Medications Ordered in ED Medications - No data to display   Initial Impression / Assessment and Plan / ED Course  I have reviewed the triage vital signs and the nursing notes.  Pertinent labs & imaging results that were available during my care of the patient were reviewed by me and considered in my medical decision making (see chart for details).  Clinical Course      Final Clinical Impressions(s) / ED Diagnoses   Final diagnoses:  Plantar fasciitis, left    New Prescriptions New Prescriptions   IBUPROFEN (ADVIL,MOTRIN) 600 MG TABLET    Take 1 tablet (600 mg total) by mouth every 6 (six) hours as needed.  An After Visit Summary was printed and given to the patient. Pt placed in a post op shoe and ace wrap.   Pt advised to follow up with his MD for recheck   Elson AreasLeslie K Sofia, PA-C 04/02/16 1723    Azalia BilisKevin Campos, MD 04/02/16 [phone]

## 2016-04-02 NOTE — ED Triage Notes (Signed)
Pt states he has had L ankle pain x 1 week. Pt also c/o indigestion. Pt states he is burping and "it tastes sour. I have a rotten taste in my mouth."

## 2016-05-22 IMAGING — CR DG CHEST 1V PORT
1 series · 1 of 1 positions shown · non-contrast
Comparison: Chest radiograph performed 09/11/2012

CLINICAL DATA: Chest pain and weakness.

EXAM:
PORTABLE CHEST - 1 VIEW

[portable]
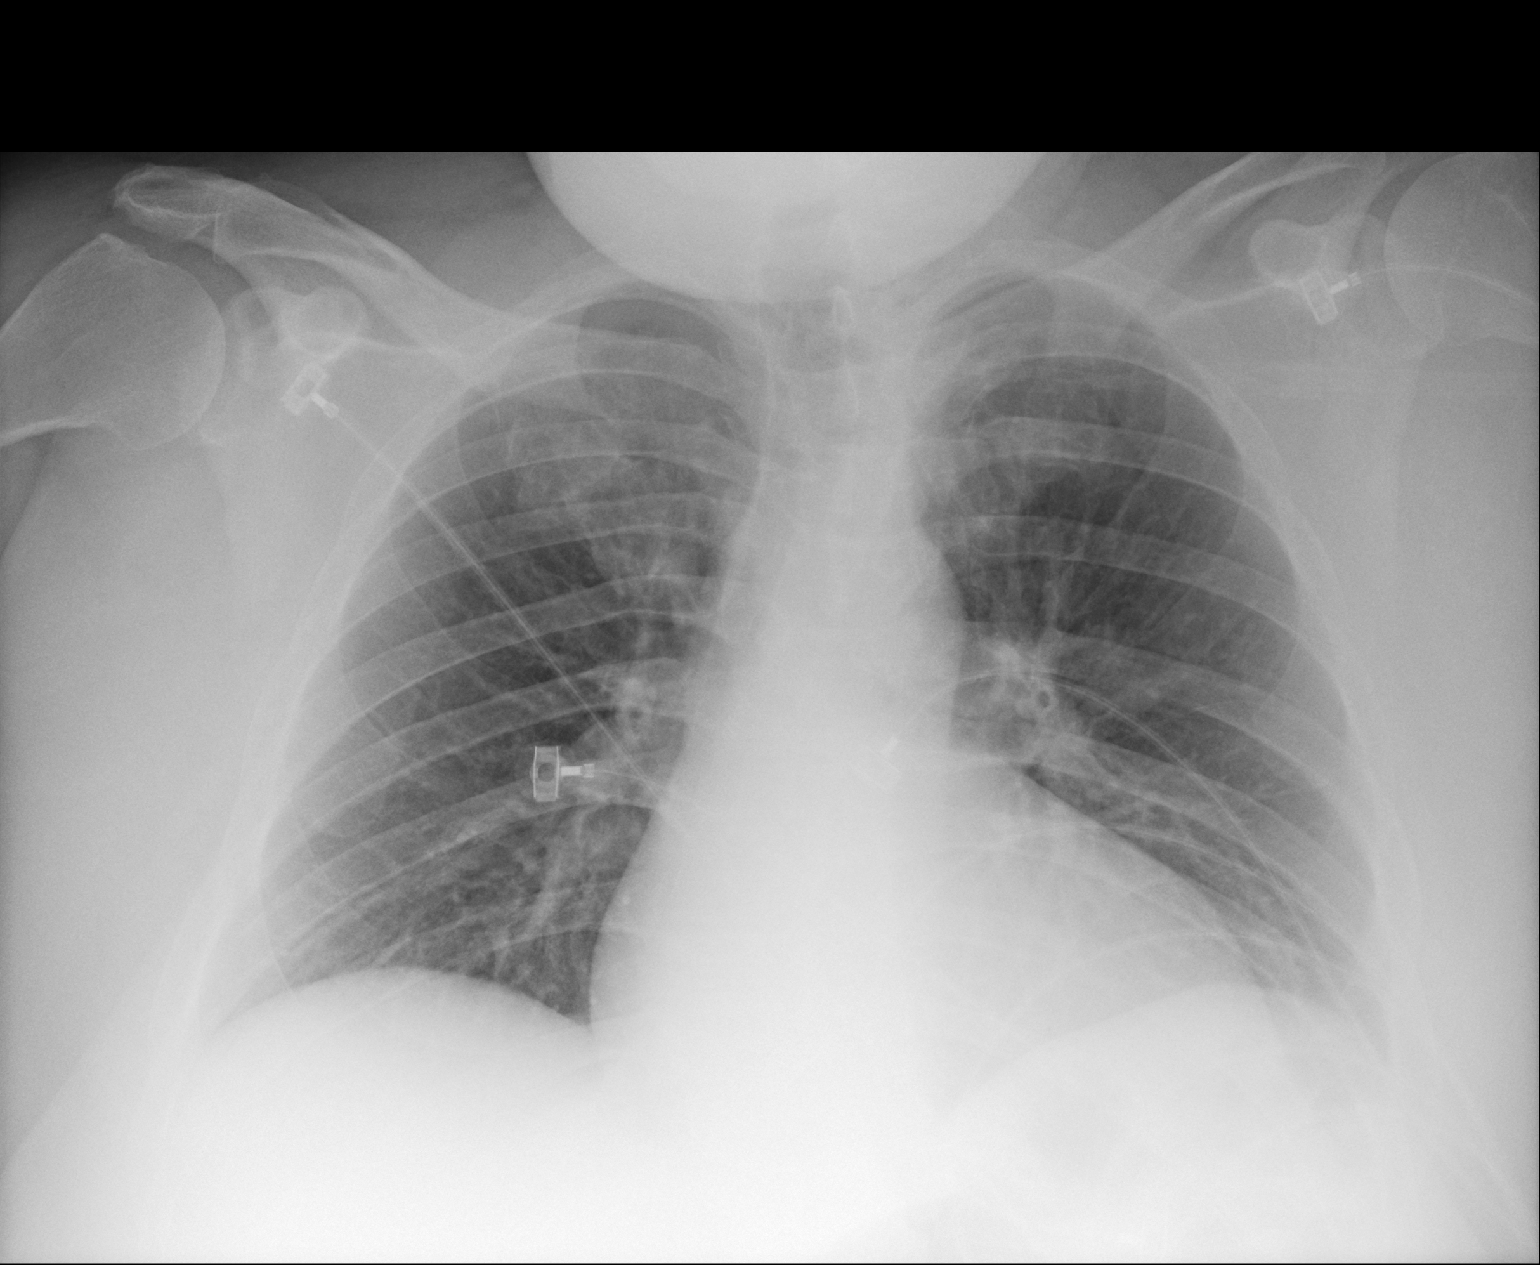

[1 of 1 positions shown; findings below may reference images not displayed]

FINDINGS: The lungs are well-aerated. Mild peribronchial thickening is noted.
There is no evidence of focal opacification, pleural effusion or
pneumothorax.

The cardiomediastinal silhouette is within normal limits. No acute
osseous abnormalities are seen.
IMPRESSION: Mild peribronchial thickening noted; lungs otherwise clear.

## 2016-07-18 ENCOUNTER — Emergency Department (HOSPITAL_COMMUNITY)
Admission: EM | Admit: 2016-07-18 | Discharge: 2016-07-18 | Disposition: A | Discharge status: Home / Self Care | Payer: BLUE CROSS/BLUE SHIELD | Attending: Emergency Medicine | Admitting: Emergency Medicine

## 2016-07-18 ENCOUNTER — Emergency Department (HOSPITAL_COMMUNITY): Payer: BLUE CROSS/BLUE SHIELD

## 2016-07-18 ENCOUNTER — Encounter (HOSPITAL_COMMUNITY): Payer: Self-pay | Admitting: Emergency Medicine

## 2016-07-18 DIAGNOSIS — R5383 Other fatigue: Secondary | ICD-10-CM | POA: Diagnosis not present

## 2016-07-18 DIAGNOSIS — R1110 Vomiting, unspecified: Secondary | ICD-10-CM | POA: Insufficient documentation

## 2016-07-18 DIAGNOSIS — J45909 Unspecified asthma, uncomplicated: Secondary | ICD-10-CM | POA: Diagnosis not present

## 2016-07-18 DIAGNOSIS — R0602 Shortness of breath: Secondary | ICD-10-CM | POA: Insufficient documentation

## 2016-07-18 DIAGNOSIS — R079 Chest pain, unspecified: Secondary | ICD-10-CM | POA: Diagnosis present

## 2016-07-18 DIAGNOSIS — I10 Essential (primary) hypertension: Secondary | ICD-10-CM | POA: Diagnosis not present

## 2016-07-18 DIAGNOSIS — Z87891 Personal history of nicotine dependence: Secondary | ICD-10-CM | POA: Insufficient documentation

## 2016-07-18 DIAGNOSIS — R42 Dizziness and giddiness: Secondary | ICD-10-CM | POA: Insufficient documentation

## 2016-07-18 LAB — CBC
HCT: 42.6 % (ref 39.0–52.0)
Hemoglobin: 14.2 g/dL (ref 13.0–17.0)
MCH: 27.5 pg (ref 26.0–34.0)
MCHC: 33.3 g/dL (ref 30.0–36.0)
MCV: 82.6 fL (ref 78.0–100.0)
PLATELETS: 232 10*3/uL (ref 150–400)
RBC: 5.16 MIL/uL (ref 4.22–5.81)
RDW: 12.9 % (ref 11.5–15.5)
WBC: 6 10*3/uL (ref 4.0–10.5)

## 2016-07-18 LAB — BASIC METABOLIC PANEL WITH GFR
Anion gap: 7 (ref 5–15)
BUN: 17 mg/dL (ref 6–20)
CO2: 24 mmol/L (ref 22–32)
Calcium: 8.8 mg/dL — ABNORMAL LOW (ref 8.9–10.3)
Chloride: 106 mmol/L (ref 101–111)
Creatinine, Ser: 1.08 mg/dL (ref 0.61–1.24)
GFR calc Af Amer: >60 mL/min (ref >60)
GFR calc non Af Amer: >60 mL/min (ref >60)
Glucose, Bld: 103 mg/dL — ABNORMAL HIGH (ref 65–99)
Potassium: 3.4 mmol/L — ABNORMAL LOW (ref 3.5–5.1)
Sodium: 137 mmol/L (ref 135–145)

## 2016-07-18 LAB — I-STAT TROPONIN, ED: TROPONIN I, POC: 0 ng/mL (ref 0.00–0.08)

## 2016-07-18 NOTE — ED Provider Notes (Signed)
[facility]-EMER[facility]Y DEPT Provider Note   CSN: [id number] Arrival date & time: 07/18/16  2114  By signing my name below, I, Bobby Bridges, attest that this documentation has been prepared under the direction and in the presence of Donnetta Hutching, MD. Electronically Signed: Modena Bridges, Scribe. 07/18/2016. 9:58 PM.  History   Chief Complaint Chief Complaint  Patient presents with  . Chest Pain   The history is provided by the patient. No language interpreter was used.   HPI Comments: Bobby Bridges is a 41 y.o. male with a PMHx of HTN and GERD who presents to the Emergency Department complaining of constant moderate left-sided chest pain that started about 3 hours ago. His pain has been present intermittently prior to today. His pain is exacerbated by exertion and relieved by rest. He describes the pain as a sharp sensation radiating to his LUE and back. He reports associated fatigue, SOB, and vomiting (one episode today). His father died from an MI at a suspected young age. He quit smoking a year ago.  Pt also complains of a gluteal bump. Denies any diaphoresis or other complaints at this time.    PCP: Dr. Felecia Shelling   Past Medical History:  Diagnosis Date  . Anxiety   . Asthma   . Chronic back pain   . Depression   . GERD (gastroesophageal reflux disease)   . Hearing loss in left ear   . Hypertension   . Seizures (HCC)    outgrew by age 49  . Sleep apnea sleep apnea    Patient Active Problem List   Diagnosis Date Noted  . Elevated troponin 09/22/2014  . Nausea 09/22/2014  . Tick bite 09/22/2014  . Major depression 02/04/2012  . Major depressive disorder, recurrent episode (HCC) 12/02/2011    Class: Acute  . REDNESS OR DISCHARGE OF EYE 02/03/2008  . ALOPECIA 02/03/2008  . HYPERLIPIDEMIA 06/27/2006  . ALLERGIC RHINITIS, SEASONAL 06/27/2006  . SLEEP APNEA 06/27/2006  . HYPERGLYCEMIA 06/27/2006  . OBESITY NOS 04/23/2006  . DEPRESSION 04/23/2006  . HYPERTENSION 04/23/2006    . ALLERGIC RHINITIS 04/23/2006  . BRONCHITIS NOS 04/23/2006  . ASTHMA 04/23/2006  . GERD 04/23/2006  . SEIZURE DISORDER 04/23/2006    History reviewed. No pertinent surgical history.     Home Medications    Prior to Admission medications   Not on File    Family History Family History  Problem Relation Age of Onset  . Cancer Mother   . Hypertension Mother   . Cancer Father   . Hypertension Father   . Heart disease Other     "before 40 years old"    Social History Social History  Substance Use Topics  . Smoking status: Former Smoker    Packs/day: 1.00    Years: 2.00    Types: Cigarettes    Quit date: 04/08/2012  . Smokeless tobacco: Never Used  . Alcohol use No     Allergies   Patient has no known allergies.   Review of Systems Review of Systems All other systems reviewed and are negative for acute change except as noted in the HPI.  Physical Exam Updated Vital Signs BP 125/79   Pulse 79   Resp 15   Ht  (1.803 m)   Wt (!) 320 lb (145.2 kg)   SpO2 98%   BMI 44.63 kg/m   Physical Exam  Constitutional: He is oriented to person, place, and time. He appears well-developed and well-nourished.  HENT:  Head: Normocephalic and  atraumatic.  Eyes: Conjunctivae are normal.  Neck: Neck supple.  Cardiovascular: Normal rate and regular rhythm.   Pulmonary/Chest: Effort normal and breath sounds normal.  Abdominal: Soft. Bowel sounds are normal.  Musculoskeletal: Normal range of motion.  Neurological: He is alert and oriented to person, place, and time.  Skin: Skin is warm and dry.  Psychiatric: He has a normal mood and affect. His behavior is normal.  Nursing note and vitals reviewed.    ED Treatments / Results  DIAGNOSTIC STUDIES: Oxygen Saturation is 98% on RA, normal by my interpretation.    COORDINATION OF CARE: 10:04 PM- Pt advised of plan for treatment and pt agrees. Treatment plan will include cardiac workup.  Labs (all labs ordered  are listed, but only abnormal results are displayed) Labs Reviewed  BASIC METABOLIC PANEL - Abnormal; Notable for the following:       Result Value   Potassium 3.4 (*)    Glucose, Bld 103 (*)    Calcium 8.8 (*)    All other components within normal limits  CBC  I-STAT TROPOININ, ED    EKG  EKG Interpretation  Date/Time:  Wednesday July 18 2016 21:25:01 EDT Ventricular Rate:  88 PR Interval:    QRS Duration: 110 QT Interval:  359 QTC Calculation: 435 R Axis:   120 Text Interpretation:  Sinus rhythm Probable left atrial enlargement Right axis deviation Confirmed by Adriana Simas  MD, Quinterious Walraven ([id number]) on 07/18/2016 10:19:10 PM       Radiology Dg Chest 2 View  Result Date: 07/18/2016 CLINICAL DATA:  LEFT chest pain beginning at 1900 hours today, back pain and dizziness. History of hypertension and asthma. EXAM: CHEST  2 VIEW COMPARISON:  Chest radiograph March 17, 2016 FINDINGS: Cardiac silhouette is mild-to-moderately enlarged. Mediastinal silhouette is unremarkable. No pleural effusion or focal consolidation. No pneumothorax. Large body habitus. Osseous structures are nonsuspicious. IMPRESSION: Stable cardiomegaly, no acute pulmonary process. Electronically Signed   By: Awilda Metro M.D.   On: 07/18/2016 22:07    Procedures Procedures (including critical care time)  Medications Ordered in ED Medications - No data to display   Initial Impression / Assessment and Plan / ED Course  I have reviewed the triage vital signs and the nursing notes.  Pertinent labs & imaging results that were available during my care of the patient were reviewed by me and considered in my medical decision making (see chart for details).     Patient presents with chest pain. Risk factor profile is low. Screening tests including labs, EKG, chest x-ray, troponin all negative. Patient has primary care follow-up.  Final Clinical Impressions(s) / ED Diagnoses   Final diagnoses:  Chest pain, unspecified  type    New Prescriptions There are no discharge medications for this patient.  I personally performed the services described in this documentation, which was scribed in my presence. The recorded information has been reviewed and is accurate.      Donnetta Hutching, MD 07/19/16 1620

## 2016-07-18 NOTE — Discharge Instructions (Signed)
Tests were good. Recommend follow-up with a heart doctor or cardiologist. Phone number given. Call tomorrow for an appointment.

## 2016-07-18 NOTE — ED Triage Notes (Signed)
Pt reports L sided CP started at 1900 today with back pain and dizziness. Denies cough or congestion. Also states he has not been able to eat for two days.

## 2016-09-22 ENCOUNTER — Emergency Department (HOSPITAL_COMMUNITY)
Admission: EM | Admit: 2016-09-22 | Discharge: 2016-09-22 | Disposition: A | Discharge status: Home / Self Care | Payer: Self-pay | Attending: Emergency Medicine | Admitting: Emergency Medicine

## 2016-09-22 ENCOUNTER — Emergency Department (HOSPITAL_COMMUNITY): Payer: Self-pay

## 2016-09-22 ENCOUNTER — Encounter (HOSPITAL_COMMUNITY): Payer: Self-pay | Admitting: Emergency Medicine

## 2016-09-22 DIAGNOSIS — Z87891 Personal history of nicotine dependence: Secondary | ICD-10-CM | POA: Insufficient documentation

## 2016-09-22 DIAGNOSIS — R072 Precordial pain: Secondary | ICD-10-CM | POA: Insufficient documentation

## 2016-09-22 DIAGNOSIS — R0602 Shortness of breath: Secondary | ICD-10-CM | POA: Insufficient documentation

## 2016-09-22 DIAGNOSIS — I10 Essential (primary) hypertension: Secondary | ICD-10-CM | POA: Insufficient documentation

## 2016-09-22 DIAGNOSIS — R42 Dizziness and giddiness: Secondary | ICD-10-CM | POA: Insufficient documentation

## 2016-09-22 DIAGNOSIS — J45909 Unspecified asthma, uncomplicated: Secondary | ICD-10-CM | POA: Insufficient documentation

## 2016-09-22 LAB — BASIC METABOLIC PANEL
ANION GAP: 6 (ref 5–15)
BUN: 17 mg/dL (ref 6–20)
CO2: 24 mmol/L (ref 22–32)
Calcium: 8.6 mg/dL — ABNORMAL LOW (ref 8.9–10.3)
Chloride: 106 mmol/L (ref 101–111)
Creatinine, Ser: 1.06 mg/dL (ref 0.61–1.24)
Glucose, Bld: 108 mg/dL — ABNORMAL HIGH (ref 65–99)
POTASSIUM: 3.5 mmol/L (ref 3.5–5.1)
Sodium: 136 mmol/L (ref 135–145)

## 2016-09-22 LAB — CBC
HEMATOCRIT: 44 % (ref 39.0–52.0)
HEMOGLOBIN: 14.4 g/dL (ref 13.0–17.0)
MCH: 27 pg (ref 26.0–34.0)
MCHC: 32.7 g/dL (ref 30.0–36.0)
MCV: 82.4 fL (ref 78.0–100.0)
Platelets: 210 10*3/uL (ref 150–400)
RBC: 5.34 MIL/uL (ref 4.22–5.81)
RDW: 13.1 % (ref 11.5–15.5)
WBC: 4 10*3/uL (ref 4.0–10.5)

## 2016-09-22 LAB — I-STAT TROPONIN, ED
TROPONIN I, POC: 0 ng/mL (ref 0.00–0.08)
TROPONIN I, POC: 0.01 ng/mL (ref 0.00–0.08)
TROPONIN I, POC: 0 ng/mL (ref 0.00–0.08)

## 2016-09-22 NOTE — ED Triage Notes (Signed)
Patient c/o non-radiating, mid-sternal chest pain that started 2 days ago. Per patient shortness of breath and "light headedness." Denies any cardiac hx.

## 2016-09-22 NOTE — ED Provider Notes (Signed)
Humberto LeepFrancis E Spina is a 41 y.o. male who's care was transferred to me at change of shift.  He had a negative cardiac workup at time of shift change.  He had a second troponin pending collection at 1330, which was obtained 3 hours after previous and was negative.   Blood pressure 110/70, pulse 78, temperature 99.2 F (37.3 C), temperature source Oral, resp. rate 20, height 5\' 11"  (1.803 m), weight (!) 141.5 kg (312 lb), SpO2 99 %.  Troponin (Point of Care Test)  Recent Labs  09/22/16 1340  TROPIPOC 0.00   Work up in ED negative.  Low risk for ischemia w/ heart score of 2.  Strict return precautions reviewed.  He will follow up with PCP next week.  He was discharged in stable condition home.    Caily Rakers M. Nadine CountsGottschalk, DO PGY-3, [facility]       Delynn FlavinGottschalk, Marcedes Tech M, DO 09/22/16 1356    Blane OharaZavitz, Joshua, MD 09/22/16 1520

## 2016-09-22 NOTE — Discharge Instructions (Addendum)
Suspect your chest pain is related to musculoskeletal pain perhaps from lifting at work. However, would recommend follow up with your PCP on Monday to discuss need for further work up.   Return if your chest pain significantly worsens, it radiates to your jaw/neck/arm, you become sweaty, you have significant shortness of breath, or you have vomiting. You should also be see if you have weakness of an extremity, numbness of an extremity, changes in your speech or confusion.

## 2016-09-22 NOTE — ED Provider Notes (Signed)
[facility] DE[facility] Provider Note   CSN: [id number] Arrival date & time: 09/22/16  0957     History   Chief Complaint Chief Complaint  Patient presents with  . Chest Pain    HPI Bobby Bridges is a 41 y.o. male.  HPI   41 year old male who denies significant PMH and is currently not taking any medications who presents for chest pain. Chest pain started two days ago. Is located to the left side of his sternum. Described as sharp, stabbing pain. Pain does not radiate. Heavy lifting at his job worsens the pain. Occasionally pain is worsened by inspiration as well. Has not tried anything to make the pain better. Endorses associated SOB. Denies nausea and vomiting. Endorses bilateral ankle swelling in the evening that resolves after he elevates his feet during sleep. Denies history of HTN, HLD, cardiac history. Denies history of DVT/PE.   Additionally, reports that he has had trouble sleeping at night. He lies awake at night and insists that he needs to be given something to help him sleep at night.   Past Medical History:  Diagnosis Date  . Anxiety   . Asthma   . Chronic back pain   . Depression   . GERD (gastroesophageal reflux disease)   . Hearing loss in left ear   . Hypertension   . Seizures (HCC)    outgrew by age 41  . Sleep apnea sleep apnea    Patient Active Problem List   Diagnosis Date Noted  . Elevated troponin 09/22/2014  . Nausea 09/22/2014  . Tick bite 09/22/2014  . Major depression 02/04/2012  . Major depressive disorder, recurrent episode (HCC) 12/02/2011    Class: Acute  . REDNESS OR DISCHARGE OF EYE 02/03/2008  . ALOPECIA 02/03/2008  . HYPERLIPIDEMIA 06/27/2006  . ALLERGIC RHINITIS, SEASONAL 06/27/2006  . SLEEP APNEA 06/27/2006  . HYPERGLYCEMIA 06/27/2006  . OBESITY NOS 04/23/2006  . DEPRESSION 04/23/2006  . HYPERTENSION 04/23/2006  . ALLERGIC RHINITIS 04/23/2006  . BRONCHITIS NOS 04/23/2006  . ASTHMA 04/23/2006  . GERD 04/23/2006  .  SEIZURE DISORDER 04/23/2006    History reviewed. No pertinent surgical history.     Home Medications    Prior to Admission medications   Not on File    Family History Family History  Problem Relation Age of Onset  . Cancer Mother   . Hypertension Mother   . Cancer Father   . Hypertension Father   . Heart disease Other        "before 40 years old"    Social History Social History  Substance Use Topics  . Smoking status: Former Smoker    Packs/day: 1.00    Years: 2.00    Types: Cigarettes    Quit date: 04/08/2012  . Smokeless tobacco: Never Used  . Alcohol use No     Allergies   Patient has no known allergies.   Review of Systems Review of Systems  Constitutional: Negative for chills and fever.  HENT: Negative for congestion and rhinorrhea.   Respiratory: Positive for shortness of breath. Negative for cough, chest tightness and wheezing.   Cardiovascular: Positive for chest pain. Negative for palpitations.  Gastrointestinal: Negative for abdominal distention, abdominal pain, nausea and vomiting.  Neurological: Positive for light-headedness.     Physical Exam Updated Vital Signs BP 110/70   Pulse 78   Temp 99.2 F (37.3 C) (Oral)   Resp 20   Ht 5\' 11"  (1.803 m)   Wt (!) 141.5 kg (  312 lb)   SpO2 99%   BMI 43.52 kg/m   Physical Exam  Constitutional: He appears well-developed and well-nourished. No distress.  HENT:  Head: Normocephalic and atraumatic.  Mouth/Throat: Oropharynx is clear and moist.  Eyes: Conjunctivae and EOM are normal. Pupils are equal, round, and reactive to light.  Neck: Normal range of motion. Neck supple.  Cardiovascular: Normal rate, regular rhythm and normal heart sounds.   No murmur heard. Pulmonary/Chest: Effort normal. No respiratory distress. He has no wheezes. He has no rales. He exhibits tenderness.  Palpation to the left sternal border over rib spaces reproduces pain.   Abdominal: Soft. Bowel sounds are normal. He  exhibits no distension. There is no tenderness. There is no guarding.  Musculoskeletal: He exhibits no edema.  No calf tenderness to palpation. Negative Homan's sign bilaterally.   Skin: Skin is warm and dry.     ED Treatments / Results  Labs (all labs ordered are listed, but only abnormal results are displayed) Labs Reviewed  BASIC METABOLIC PANEL - Abnormal; Notable for the following:       Result Value   Glucose, Bld 108 (*)    Calcium 8.6 (*)    All other components within normal limits  CBC  TROPONIN I  I-STAT TROPOININ, ED  I-STAT TROPOININ, ED    EKG  EKG Interpretation None       Radiology Dg Chest 2 View  Result Date: 09/22/2016 CLINICAL DATA:  Patient c/o non-radiating, mid-sternal chest pain that started 2 days ago. Per patient shortness of breath and "light headedness." Hx of asthma, HTN, sleep apnea. Ex-smoker. EXAM: CHEST  2 VIEW COMPARISON:  07/18/2016 FINDINGS: Heart size is upper limits normal. There are no focal consolidations or pleural effusions. No pulmonary edema. There is convex right scoliosis of the thoracic spine, associated with degenerative changes. IMPRESSION: No evidence for acute  abnormality. Electronically Signed   By: Norva Pavlov M.D.   On: 09/22/2016 10:52    Procedures Procedures (including critical care time)  Medications Ordered in ED Medications - No data to display   Initial Impression / Assessment and Plan / ED Course  I have reviewed the triage vital signs and the nursing notes.  Pertinent labs & imaging results that were available during my care of the patient were reviewed by me and considered in my medical decision making (see chart for details).    41 year old male presenting with chest pain. VSS. EKG abnormal with incomplete RBBB but appears consistent with prior EKGs and no acute ST segment changes. Initial troponin negative. CXR without acute abnormalities.  CBC and BMET unremarkable. Chest pain is atypical in  nature. May be musculoskeletal in nature given reproducibility on exam and history of worsening with lifting. PERC score of 0 so very low suspicion for PE as cause of pain.   From chart review, patient had minimally elevated troponin (0.04) in June 2016. Patient was admitted and subsequently had negative serial troponins. Cardiology was consulted and did not pursue a further ischemic work up. Would recommend follow up with PCP to discuss need for referral to cardiology. Patient denies PMH but from review of history, appears to have risk factor of HTN, as well as obesity.    11:45: Patient signed out to oncoming provider. Second troponin pending.   Final Clinical Impressions(s) / ED Diagnoses   Final diagnoses:  Precordial chest pain    New Prescriptions New Prescriptions   No medications on file  Arvilla Market, DO 09/22/16 1147    Blane Ohara, MD 09/22/16 [phone]

## 2016-10-19 ENCOUNTER — Emergency Department (HOSPITAL_COMMUNITY)
Admission: EM | Admit: 2016-10-19 | Discharge: 2016-10-20 | Disposition: A | Discharge status: Home / Self Care | Payer: Self-pay | Attending: Emergency Medicine | Admitting: Emergency Medicine

## 2016-10-19 ENCOUNTER — Encounter (HOSPITAL_COMMUNITY): Payer: Self-pay | Admitting: *Deleted

## 2016-10-19 DIAGNOSIS — Z87891 Personal history of nicotine dependence: Secondary | ICD-10-CM | POA: Insufficient documentation

## 2016-10-19 DIAGNOSIS — X58XXXA Exposure to other specified factors, initial encounter: Secondary | ICD-10-CM | POA: Insufficient documentation

## 2016-10-19 DIAGNOSIS — G8929 Other chronic pain: Secondary | ICD-10-CM

## 2016-10-19 DIAGNOSIS — Y939 Activity, unspecified: Secondary | ICD-10-CM | POA: Insufficient documentation

## 2016-10-19 DIAGNOSIS — S93402A Sprain of unspecified ligament of left ankle, initial encounter: Secondary | ICD-10-CM | POA: Insufficient documentation

## 2016-10-19 DIAGNOSIS — Y999 Unspecified external cause status: Secondary | ICD-10-CM | POA: Insufficient documentation

## 2016-10-19 DIAGNOSIS — J45909 Unspecified asthma, uncomplicated: Secondary | ICD-10-CM | POA: Insufficient documentation

## 2016-10-19 DIAGNOSIS — Y929 Unspecified place or not applicable: Secondary | ICD-10-CM | POA: Insufficient documentation

## 2016-10-19 DIAGNOSIS — M5442 Lumbago with sciatica, left side: Secondary | ICD-10-CM | POA: Insufficient documentation

## 2016-10-19 DIAGNOSIS — I10 Essential (primary) hypertension: Secondary | ICD-10-CM | POA: Insufficient documentation

## 2016-10-19 NOTE — ED Triage Notes (Signed)
Pt reports left thigh pain, left calf tightness, and left ankle pain and swelling x 1 week. Pt reports he hasn't slept much in 2 days.

## 2016-10-20 ENCOUNTER — Emergency Department (HOSPITAL_COMMUNITY): Payer: Self-pay

## 2016-10-20 LAB — CBC WITH DIFFERENTIAL/PLATELET
Basophils Absolute: 0 10*3/uL (ref 0.0–0.1)
Basophils Relative: 0 %
EOS PCT: 1 %
Eosinophils Absolute: 0.1 10*3/uL (ref 0.0–0.7)
HCT: 46.1 % (ref 39.0–52.0)
HEMOGLOBIN: 15.2 g/dL (ref 13.0–17.0)
LYMPHS PCT: 37 %
Lymphs Abs: 2.6 10*3/uL (ref 0.7–4.0)
MCH: 26.9 pg (ref 26.0–34.0)
MCHC: 33 g/dL (ref 30.0–36.0)
MCV: 81.6 fL (ref 78.0–100.0)
MONOS PCT: 10 %
Monocytes Absolute: 0.7 10*3/uL (ref 0.1–1.0)
Neutro Abs: 3.6 10*3/uL (ref 1.7–7.7)
Neutrophils Relative %: 52 %
PLATELETS: 212 10*3/uL (ref 150–400)
RBC: 5.65 MIL/uL (ref 4.22–5.81)
RDW: 13 % (ref 11.5–15.5)
WBC: 6.9 10*3/uL (ref 4.0–10.5)

## 2016-10-20 LAB — D-DIMER, QUANTITATIVE: D-Dimer, Quant: 0.29 ug/mL-FEU (ref 0.00–0.50)

## 2016-10-20 LAB — BASIC METABOLIC PANEL
Anion gap: 3 — ABNORMAL LOW (ref 5–15)
BUN: 19 mg/dL (ref 6–20)
CHLORIDE: 111 mmol/L (ref 101–111)
CO2: 25 mmol/L (ref 22–32)
Calcium: 8.6 mg/dL — ABNORMAL LOW (ref 8.9–10.3)
Creatinine, Ser: 0.92 mg/dL (ref 0.61–1.24)
GFR calc Af Amer: >60 mL/min (ref >60)
GFR calc non Af Amer: >60 mL/min (ref >60)
GLUCOSE: 102 mg/dL — AB (ref 65–99)
POTASSIUM: 3.7 mmol/L (ref 3.5–5.1)
Sodium: 139 mmol/L (ref 135–145)

## 2016-10-20 MED ORDER — IBUPROFEN 600 MG PO TABS: 600.0000 mg | ORAL_TABLET | Freq: Four times a day (QID) | ORAL | 0 refills | Status: DC

## 2016-10-20 MED ORDER — CYCLOBENZAPRINE HCL 10 MG PO TABS: 10.0000 mg | ORAL_TABLET | Freq: Three times a day (TID) | ORAL | 0 refills | Status: DC

## 2016-10-20 MED ORDER — IBUPROFEN 800 MG PO TABS
800.0000 mg | ORAL_TABLET | Freq: Once | ORAL | Status: AC
  Administered 2016-10-20: 800 mg via ORAL
  Filled 2016-10-20: qty 1

## 2016-10-20 MED ORDER — DEXAMETHASONE SODIUM PHOSPHATE 4 MG/ML IJ SOLN
8.0000 mg | Freq: Once | INTRAMUSCULAR | Status: AC
  Administered 2016-10-20: 8 mg via INTRAMUSCULAR
  Filled 2016-10-20: qty 2

## 2016-10-20 MED ORDER — DEXAMETHASONE 4 MG PO TABS: 4.0000 mg | ORAL_TABLET | Freq: Two times a day (BID) | ORAL | 0 refills | Status: DC

## 2016-10-20 NOTE — Discharge Instructions (Signed)
The x-ray of your ankle is negative for fracture or dislocation. There is noted some soft tissue swelling. The lab test is negative for evidence of infection, electrolyte imbalance, or blood clot. I suspect that you have a ankle sprain. And I think this problem is aggravated by your chronic low back pain. Please speak with Dr. Felecia ShellingFanta about her symptoms. The two-view may need to decide if it's time for an MRI of your back. Please use a heating pad to your back. Please elevate your leg is much as possible. Use an ice pack to your ankle. Use Decadron 2 times daily with food. Use ibuprofen with breakfast, lunch, dinner, and at bedtime. Use Flexeril 3 times daily for his spasm type pain. Flexeril may cause drowsiness, please use this medication with caution.

## 2016-10-20 NOTE — ED Provider Notes (Signed)
[facility] Provider Note   CSN: [id number] Arrival date & time: 10/19/16  2156     History   Chief Complaint Chief Complaint  Patient presents with  . Leg Pain    HPI Bobby Bridges is a 41 y.o. male.  Patient is a 41 year old male who presents to the emergency department with a complaint of left thigh pain, left calf tenderness, and left ankle pain.  Patient has a history of osteoarthritis involving the lumbar spine, anxiety, depression, chronic back pain.  The patient states that about one week ago he began to have pain in his left ankle. He was limping and is limping got progressively worse. Following this he noted left calf pain, and pain going up into his thigh. He states that he has some swelling of his legs. He states this is not particularly new. He's not had any recent injury or trauma to his legs. The patient states that his lower back pain seems to also be getting worse. He has not had any loss of control of bowel or bladder. He states he's been trying over-the-counter medications and conservative measures at home, but he says he cannot take the pain any longer so he came into the emergency department to be evaluated and also to seek management of his pain.      Past Medical History:  Diagnosis Date  . Anxiety   . Asthma   . Chronic back pain   . Depression   . GERD (gastroesophageal reflux disease)   . Hearing loss in left ear   . Hypertension   . Seizures (HCC)    outgrew by age 41  . Sleep apnea sleep apnea    Patient Active Problem List   Diagnosis Date Noted  . Elevated troponin 09/22/2014  . Nausea 09/22/2014  . Tick bite 09/22/2014  . Major depression 02/04/2012  . Major depressive disorder, recurrent episode (HCC) 12/02/2011    Class: Acute  . REDNESS OR DISCHARGE OF EYE 02/03/2008  . ALOPECIA 02/03/2008  . HYPERLIPIDEMIA 06/27/2006  . ALLERGIC RHINITIS, SEASONAL 06/27/2006  . SLEEP APNEA 06/27/2006  . HYPERGLYCEMIA 06/27/2006    . OBESITY NOS 04/23/2006  . DEPRESSION 04/23/2006  . HYPERTENSION 04/23/2006  . ALLERGIC RHINITIS 04/23/2006  . BRONCHITIS NOS 04/23/2006  . ASTHMA 04/23/2006  . GERD 04/23/2006  . SEIZURE DISORDER 04/23/2006    History reviewed. No pertinent surgical history.     Home Medications    Prior to Admission medications   Not on File    Family History Family History  Problem Relation Age of Onset  . Cancer Mother   . Hypertension Mother   . Cancer Father   . Hypertension Father   . Heart disease Other        "before 40 years old"    Social History Social History  Substance Use Topics  . Smoking status: Former Smoker    Packs/day: 1.00    Years: 2.00    Types: Cigarettes    Quit date: 04/08/2012  . Smokeless tobacco: Never Used  . Alcohol use No     Allergies   Patient has no known allergies.   Review of Systems Review of Systems  Constitutional: Negative for activity change.       All ROS Neg except as noted in HPI  HENT: Negative for nosebleeds.   Eyes: Negative for photophobia and discharge.  Respiratory: Negative for cough, shortness of breath and wheezing.   Cardiovascular: Negative for chest pain and palpitations.  Gastrointestinal: Negative for abdominal pain and blood in stool.  Genitourinary: Negative for dysuria, frequency and hematuria.  Musculoskeletal: Positive for arthralgias and back pain. Negative for neck pain.  Skin: Negative.   Neurological: Negative for dizziness, seizures and speech difficulty.  Psychiatric/Behavioral: Negative for confusion and hallucinations.     Physical Exam Updated Vital Signs BP 121/81 (BP Location: Right Arm)   Pulse (!) 121   Temp 99.1 F (37.3 C) (Oral)   Resp 18   Ht 5\' 11"  (1.803 m)   Wt (!) 141.5 kg (312 lb)   SpO2 98%   BMI 43.52 kg/m   Physical Exam  Constitutional: He is oriented to person, place, and time. He appears well-developed and well-nourished.  Non-toxic appearance.  HENT:  Head:  Normocephalic.  Right Ear: Tympanic membrane and external ear normal.  Left Ear: Tympanic membrane and external ear normal.  Eyes: Pupils are equal, round, and reactive to light. EOM and lids are normal.  Neck: Normal range of motion. Neck supple. Carotid bruit is not present.  Cardiovascular: Normal rate, regular rhythm, normal heart sounds, intact distal pulses and normal pulses.   Pulmonary/Chest: Breath sounds normal. No respiratory distress.  Abdominal: Soft. Bowel sounds are normal. There is no tenderness. There is no guarding.  Musculoskeletal: Normal range of motion.  There is swelling and puffiness of the right and left lower extremity. The dorsalis pedis pulse on the left is 2+. There is full range of motion of the toes. There is pain with attempted range of motion of the left ankle. The pain is mostly at the lateral malleolus. There is calf tenderness to flexion and extension of the ankle area. There is no pain to palpation in the calf area. There no hot areas appreciated of the lower extremities.   Lymphadenopathy:       Head (right side): No submandibular adenopathy present.       Head (left side): No submandibular adenopathy present.    He has no cervical adenopathy.  Neurological: He is alert and oriented to person, place, and time. He has normal strength. No cranial nerve deficit or sensory deficit.  Skin: Skin is warm and dry.  Psychiatric: He has a normal mood and affect. His speech is normal.  Nursing note and vitals reviewed.    ED Treatments / Results  Labs (all labs ordered are listed, but only abnormal results are displayed) Labs Reviewed  CBC WITH DIFFERENTIAL/PLATELET  BASIC METABOLIC PANEL  D-DIMER, QUANTITATIVE (NOT AT [facility])    EKG  EKG Interpretation None       Radiology No results found.  Procedures Procedures (including critical care time)  Medications Ordered in ED Medications - No data to display   Initial Impression / Assessment and Plan  / ED Course  I have reviewed the triage vital signs and the nursing notes.  Pertinent labs & imaging results that were available during my care of the patient were reviewed by me and considered in my medical decision making (see chart for details).       Final Clinical Impressions(s) / ED Diagnoses MDM Vital signs reviewed. Pulse oximetry is 98% on room air.  Complete blood count is well within normal limits. D-dimer is negative at 0.29.  Recheck. Patient resting quietly. No distress.  X-ray of the left ankle show some soft tissue swelling, but no osseous abnormality noted. The basic metabolic panel is nonacute.  I suspect the patient has a ankle sprain, aggravated by chronic low back problems. The  patient will be treated with anti-inflammatory medication, muscle relaxer, and steroid. The patient is to follow-up with Dr. Felecia Shelling for additional evaluation and management of these issues. Patient is also fitted with an ankle stirrup splint and an ice pack.    Final diagnoses:  Sprain of left ankle, unspecified ligament, initial encounter  Chronic left-sided low back pain with left-sided sciatica    New Prescriptions New Prescriptions   No medications on file     Ivery Quale, Cordelia Poche 10/20/16 0144    [name], MD 10/20/16 [phone]

## 2016-11-12 ENCOUNTER — Emergency Department (HOSPITAL_COMMUNITY): Payer: Self-pay

## 2016-11-12 ENCOUNTER — Encounter (HOSPITAL_COMMUNITY): Payer: Self-pay | Admitting: Emergency Medicine

## 2016-11-12 ENCOUNTER — Emergency Department (HOSPITAL_COMMUNITY)
Admission: EM | Admit: 2016-11-12 | Discharge: 2016-11-12 | Disposition: A | Discharge status: Home / Self Care | Payer: Self-pay | Attending: Emergency Medicine | Admitting: Emergency Medicine

## 2016-11-12 DIAGNOSIS — J45909 Unspecified asthma, uncomplicated: Secondary | ICD-10-CM | POA: Insufficient documentation

## 2016-11-12 DIAGNOSIS — M25572 Pain in left ankle and joints of left foot: Secondary | ICD-10-CM | POA: Insufficient documentation

## 2016-11-12 DIAGNOSIS — Z87891 Personal history of nicotine dependence: Secondary | ICD-10-CM | POA: Insufficient documentation

## 2016-11-12 DIAGNOSIS — I10 Essential (primary) hypertension: Secondary | ICD-10-CM | POA: Insufficient documentation

## 2016-11-12 DIAGNOSIS — R600 Localized edema: Secondary | ICD-10-CM

## 2016-11-12 DIAGNOSIS — Z79899 Other long term (current) drug therapy: Secondary | ICD-10-CM | POA: Insufficient documentation

## 2016-11-12 DIAGNOSIS — Z202 Contact with and (suspected) exposure to infections with a predominantly sexual mode of transmission: Secondary | ICD-10-CM

## 2016-11-12 LAB — CBC WITH DIFFERENTIAL/PLATELET
Basophils Absolute: 0 10*3/uL (ref 0.0–0.1)
Basophils Relative: 1 %
Eosinophils Absolute: 0.1 10*3/uL (ref 0.0–0.7)
Eosinophils Relative: 2 %
HEMATOCRIT: 44.1 % (ref 39.0–52.0)
HEMOGLOBIN: 14.3 g/dL (ref 13.0–17.0)
LYMPHS PCT: 39 %
Lymphs Abs: 1.5 10*3/uL (ref 0.7–4.0)
MCH: 27 pg (ref 26.0–34.0)
MCHC: 32.4 g/dL (ref 30.0–36.0)
MCV: 83.2 fL (ref 78.0–100.0)
MONO ABS: 0.3 10*3/uL (ref 0.1–1.0)
Monocytes Relative: 9 %
NEUTROS ABS: 2 10*3/uL (ref 1.7–7.7)
Neutrophils Relative %: 51 %
Platelets: 240 10*3/uL (ref 150–400)
RBC: 5.3 MIL/uL (ref 4.22–5.81)
RDW: 13.3 % (ref 11.5–15.5)
WBC: 4 10*3/uL (ref 4.0–10.5)

## 2016-11-12 LAB — URINALYSIS, ROUTINE W REFLEX MICROSCOPIC
Bilirubin Urine: NEGATIVE
GLUCOSE, UA: NEGATIVE mg/dL
Hgb urine dipstick: NEGATIVE
KETONES UR: NEGATIVE mg/dL
Nitrite: NEGATIVE
PROTEIN: NEGATIVE mg/dL
Specific Gravity, Urine: 1.018 (ref 1.005–1.030)
pH: 7 (ref 5.0–8.0)

## 2016-11-12 LAB — BASIC METABOLIC PANEL
ANION GAP: 6 (ref 5–15)
BUN: 16 mg/dL (ref 6–20)
CO2: 24 mmol/L (ref 22–32)
Calcium: 8.7 mg/dL — ABNORMAL LOW (ref 8.9–10.3)
Chloride: 108 mmol/L (ref 101–111)
Creatinine, Ser: 0.92 mg/dL (ref 0.61–1.24)
GFR calc Af Amer: >60 mL/min (ref >60)
GLUCOSE: 93 mg/dL (ref 65–99)
Potassium: 4.2 mmol/L (ref 3.5–5.1)
Sodium: 138 mmol/L (ref 135–145)

## 2016-11-12 MED ORDER — HYDROCHLOROTHIAZIDE 25 MG PO TABS: 25.0000 mg | ORAL_TABLET | Freq: Every day | ORAL | 0 refills | Status: DC

## 2016-11-12 MED ORDER — METRONIDAZOLE 500 MG PO TABS
500.0000 mg | ORAL_TABLET | Freq: Once | ORAL | Status: AC
  Administered 2016-11-12: 500 mg via ORAL
  Filled 2016-11-12 (×2): qty 1

## 2016-11-12 MED ORDER — HYDROCHLOROTHIAZIDE 25 MG PO TABS
25.0000 mg | ORAL_TABLET | Freq: Once | ORAL | Status: AC
  Administered 2016-11-12: 25 mg via ORAL
  Filled 2016-11-12 (×2): qty 1

## 2016-11-12 MED ORDER — METRONIDAZOLE 500 MG PO TABS: 500.0000 mg | ORAL_TABLET | Freq: Two times a day (BID) | ORAL | 0 refills | Status: DC

## 2016-11-12 MED ORDER — HYDROCHLOROTHIAZIDE 25 MG PO TABS: 25.0000 mg | ORAL_TABLET | Freq: Every day | ORAL | Status: DC

## 2016-11-12 NOTE — ED Provider Notes (Signed)
[facility]-EMERGENCY DEPT Provider Note   CSN: [id number] Arrival date & time: 11/12/16  0719     History   Chief Complaint Chief Complaint  Patient presents with  . Ankle Pain    HPI Bobby Bridges is a 41 y.o. male.  Pt presents to the ED today with left ankle pain.  It has been hurting him for about a month.  He did come to the ED on 7/13 for the same.  He was given a brace, but it is not helping.  He has been unable to see ortho as he has no medical insurance.  Pt also notes that he's had swelling in both ankles, but more on the left.  His wife was also recently diagnosed with BV and wants treatment for that as well.  Pt denies any dysuria.      Past Medical History:  Diagnosis Date  . Anxiety   . Asthma   . Chronic back pain   . Depression   . GERD (gastroesophageal reflux disease)   . Hearing loss in left ear   . Hypertension   . Seizures (HCC)    outgrew by age 72  . Sleep apnea sleep apnea    Patient Active Problem List   Diagnosis Date Noted  . Elevated troponin 09/22/2014  . Nausea 09/22/2014  . Tick bite 09/22/2014  . Major depression 02/04/2012  . Major depressive disorder, recurrent episode (HCC) 12/02/2011    Class: Acute  . REDNESS OR DISCHARGE OF EYE 02/03/2008  . ALOPECIA 02/03/2008  . HYPERLIPIDEMIA 06/27/2006  . ALLERGIC RHINITIS, SEASONAL 06/27/2006  . SLEEP APNEA 06/27/2006  . HYPERGLYCEMIA 06/27/2006  . OBESITY NOS 04/23/2006  . DEPRESSION 04/23/2006  . HYPERTENSION 04/23/2006  . ALLERGIC RHINITIS 04/23/2006  . BRONCHITIS NOS 04/23/2006  . ASTHMA 04/23/2006  . GERD 04/23/2006  . SEIZURE DISORDER 04/23/2006    History reviewed. No pertinent surgical history.     Home Medications    Prior to Admission medications   Medication Sig Start Date End Date Taking? Authorizing Provider  cyclobenzaprine (FLEXERIL) 10 MG tablet Take 1 tablet (10 mg total) by mouth 3 (three) times daily. 10/20/16   Ivery Quale, PA-C  dexamethasone  (DECADRON) 4 MG tablet Take 1 tablet (4 mg total) by mouth 2 (two) times daily with a meal. 10/20/16   Ivery Quale, PA-C  hydrochlorothiazide (HYDRODIURIL) 25 MG tablet Take 1 tablet (25 mg total) by mouth daily. 11/12/16   Jacalyn Lefevre, MD  ibuprofen (ADVIL,MOTRIN) 600 MG tablet Take 1 tablet (600 mg total) by mouth 4 (four) times daily. 10/20/16   Ivery Quale, PA-C  metroNIDAZOLE (FLAGYL) 500 MG tablet Take 1 tablet (500 mg total) by mouth 2 (two) times daily. 11/12/16   Jacalyn Lefevre, MD    Family History Family History  Problem Relation Age of Onset  . Cancer Mother   . Hypertension Mother   . Cancer Father   . Hypertension Father   . Heart disease Other        "before 40 years old"    Social History Social History  Substance Use Topics  . Smoking status: Former Smoker    Packs/day: 1.00    Years: 2.00    Types: Cigarettes    Quit date: 04/08/2012  . Smokeless tobacco: Never Used  . Alcohol use No     Allergies   Patient has no known allergies.   Review of Systems Review of Systems  Musculoskeletal:       Left ankle  pain and swelling  All other systems reviewed and are negative.    Physical Exam Updated Vital Signs BP (!) 127/93 (BP Location: Left Arm)   Pulse 68   Temp 98 F (36.7 C) (Oral)   Resp 16   Ht 6\' 1"  (1.854 m)   Wt (!) 153.3 kg (338 lb)   SpO2 100%   BMI 44.59 kg/m   Physical Exam  Constitutional: He appears well-developed and well-nourished.  HENT:  Head: Normocephalic and atraumatic.  Right Ear: External ear normal.  Left Ear: External ear normal.  Nose: Nose normal.  Mouth/Throat: Oropharynx is clear and moist.  Eyes: Pupils are equal, round, and reactive to light. Conjunctivae and EOM are normal.  Neck: Normal range of motion. Neck supple.  Cardiovascular: Normal rate, regular rhythm, normal heart sounds and intact distal pulses.   Pulmonary/Chest: Effort normal and breath sounds normal.  Abdominal: Soft. Bowel sounds are  normal.  Musculoskeletal:       Left ankle: He exhibits swelling.  Skin: Skin is warm.  Psychiatric: He has a normal mood and affect. His behavior is normal. Judgment and thought content normal.  Nursing note and vitals reviewed.    ED Treatments / Results  Labs (all labs ordered are listed, but only abnormal results are displayed) Labs Reviewed  BASIC METABOLIC PANEL - Abnormal; Notable for the following:       Result Value   Calcium 8.7 (*)    All other components within normal limits  URINALYSIS, ROUTINE W REFLEX MICROSCOPIC - Abnormal; Notable for the following:    Leukocytes, UA TRACE (*)    Bacteria, UA RARE (*)    Squamous Epithelial / LPF 0-5 (*)    All other components within normal limits  CBC WITH DIFFERENTIAL/PLATELET    EKG  EKG Interpretation None       Radiology Dg Ankle Complete Left  Result Date: 11/12/2016 CLINICAL DATA:  Left lateral ankle pain. No specific trauma history. EXAM: LEFT ANKLE COMPLETE - 3+ VIEW COMPARISON:  10/20/2016 FINDINGS: Persistent subcutaneous reticulation and swelling. No fracture, erosion, or degenerative tibiotalar narrowing. Incidental heel spur. No visible joint effusion. Mild midfoot dorsal spurring. IMPRESSION: Soft tissue swelling similar to 10/20/2016. No acute osseous finding or tibiotalar degeneration. Electronically Signed   By: Marnee Spring M.D.   On: 11/12/2016 08:14   US Venous Img Lower Unilateral Left  Result Date: 11/12/2016 CLINICAL DATA:  Lower extremity pain and edema EXAM: LEFT LOWER EXTREMITY VENOUS DUPLEX ULTRASOUND TECHNIQUE: Gray-scale sonography with graded compression, as well as color Doppler and duplex ultrasound were performed to evaluate the left lower extremity deep venous system from the level of the common femoral vein and including the common femoral, femoral, profunda femoral, popliteal and calf veins including the posterior tibial, peroneal and gastrocnemius veins when visible. The superficial great  saphenous vein was also interrogated. Spectral Doppler was utilized to evaluate flow at rest and with distal augmentation maneuvers in the common femoral, femoral and popliteal veins. COMPARISON:  None. FINDINGS: Contralateral Common Femoral Vein: Respiratory phasicity is normal and symmetric with the symptomatic side. No evidence of thrombus. Normal compressibility. Common Femoral Vein: No evidence of thrombus. Normal compressibility, respiratory phasicity and response to augmentation. Saphenofemoral Junction: No evidence of thrombus. Normal compressibility and flow on color Doppler imaging. Profunda Femoral Vein: No evidence of thrombus. Normal compressibility and flow on color Doppler imaging. Femoral Vein: No evidence of thrombus. Normal compressibility, respiratory phasicity and response to augmentation. Note that the distal femoral  vein is less than optimally visualized due to patient body habitus. Popliteal Vein: No evidence of thrombus. Normal compressibility, respiratory phasicity and response to augmentation. Calf Veins: No evidence of thrombus. Normal compressibility and flow on color Doppler imaging. Superficial Great Saphenous Vein: No evidence of thrombus. Normal compressibility and flow on color Doppler imaging. Venous Reflux:  None. Other Findings:  None. IMPRESSION: No evidence of deep venous thrombosis in the left lower extremity. Right common femoral vein also patent. Note that the distal left femoral vein is less than optimally visualized due to patient body habitus. Electronically Signed   By: Bretta BangWilliam  Woodruff III M.D.   On: 11/12/2016 09:16    Procedures Procedures (including critical care time)  Medications Ordered in ED Medications  metroNIDAZOLE (FLAGYL) tablet 500 mg (500 mg Oral Given 11/12/16 0918)  hydrochlorothiazide (HYDRODIURIL) tablet 25 mg (25 mg Oral Given 11/12/16 [id number])     Initial Impression / Assessment and Plan / ED Course  I have reviewed the triage vital signs and  the nursing notes.  Pertinent labs & imaging results that were available during my care of the patient were reviewed by me and considered in my medical decision making (see chart for details).    US neg for dvt.  Pt will be started on flagyl to treat exposure to BV.  He will be started on hctz for the swelling in his ankles.  He knows to f/u with Dr. Felecia ShellingFanta and to return if worse.  Final Clinical Impressions(s) / ED Diagnoses   Final diagnoses:  Acute left ankle pain  Pedal edema  Possible exposure to STD    New Prescriptions Discharge Medication List as of 11/12/2016  9:45 AM    START taking these medications   Details  hydrochlorothiazide (HYDRODIURIL) 25 MG tablet Take 1 tablet (25 mg total) by mouth daily., Starting Mon 11/12/2016, Print    metroNIDAZOLE (FLAGYL) 500 MG tablet Take 1 tablet (500 mg total) by mouth 2 (two) times daily., Starting Mon 11/12/2016, Print         Jacalyn LefevreHaviland, Remi Lopata, MD 11/12/16 1007

## 2016-11-12 NOTE — ED Notes (Signed)
Pt made aware to return if symptoms worsen or if any life threatening symptoms occur.   

## 2016-11-12 NOTE — ED Notes (Signed)
HCTZ originally scheduled at 1000 today, rescheduled for now per verbal order Dr. Particia NearingHaviland.

## 2016-11-12 NOTE — ED Triage Notes (Signed)
Patient complains of left ankle pain. States was seen for same 1 month ago and was given ankle brace. Patient states he stopped wearing brace stating it did not help.  Patient ambulatory in triage. NAD.

## 2016-11-12 NOTE — ED Notes (Signed)
Pt in US

## 2017-03-28 IMAGING — DX DG CHEST 2V
2 series · 2 of 2 positions shown · non-contrast
Comparison: 10/23/2013

CLINICAL DATA: Substernal chest pain x1 week

EXAM:
CHEST  2 VIEW

[chest pa]
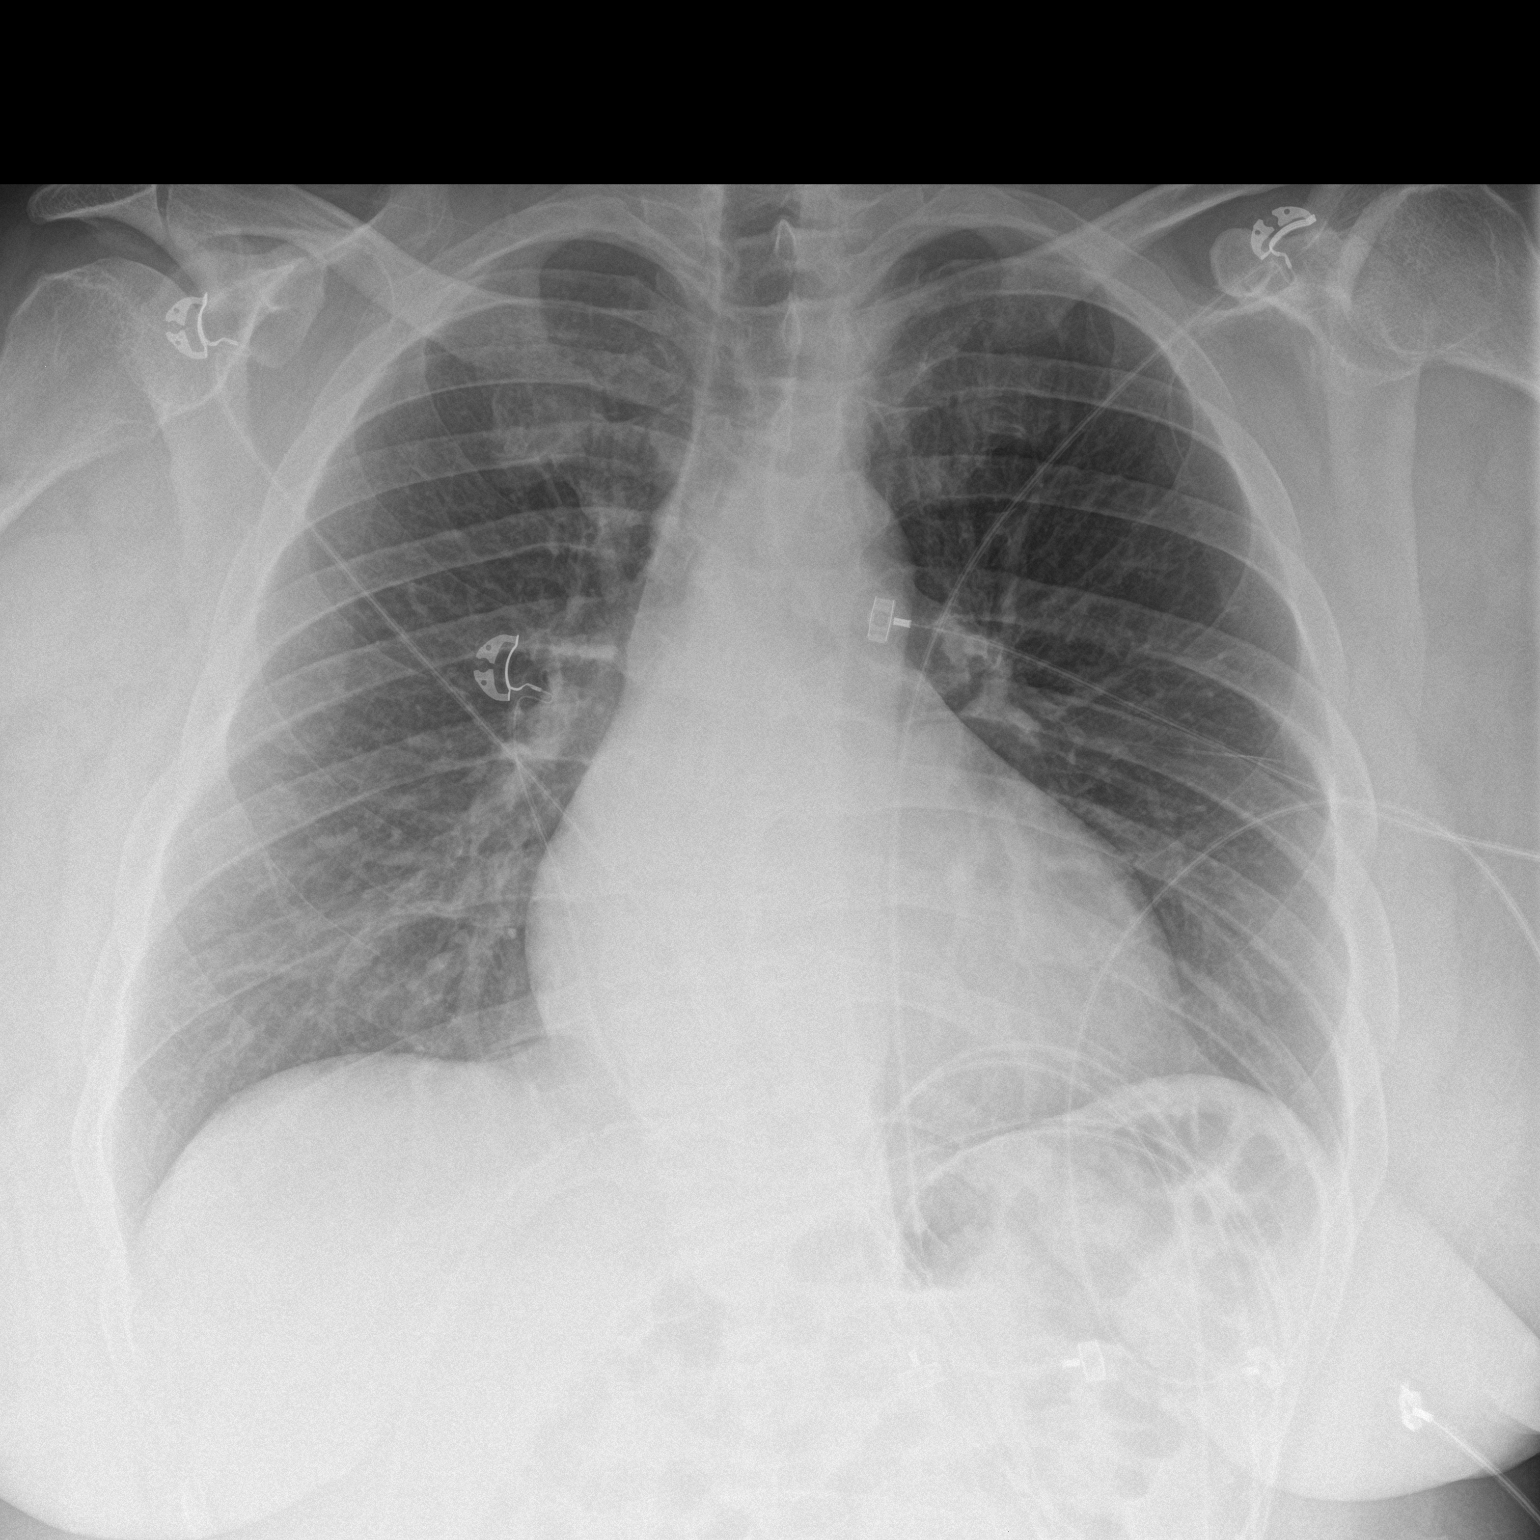

[chest lat]
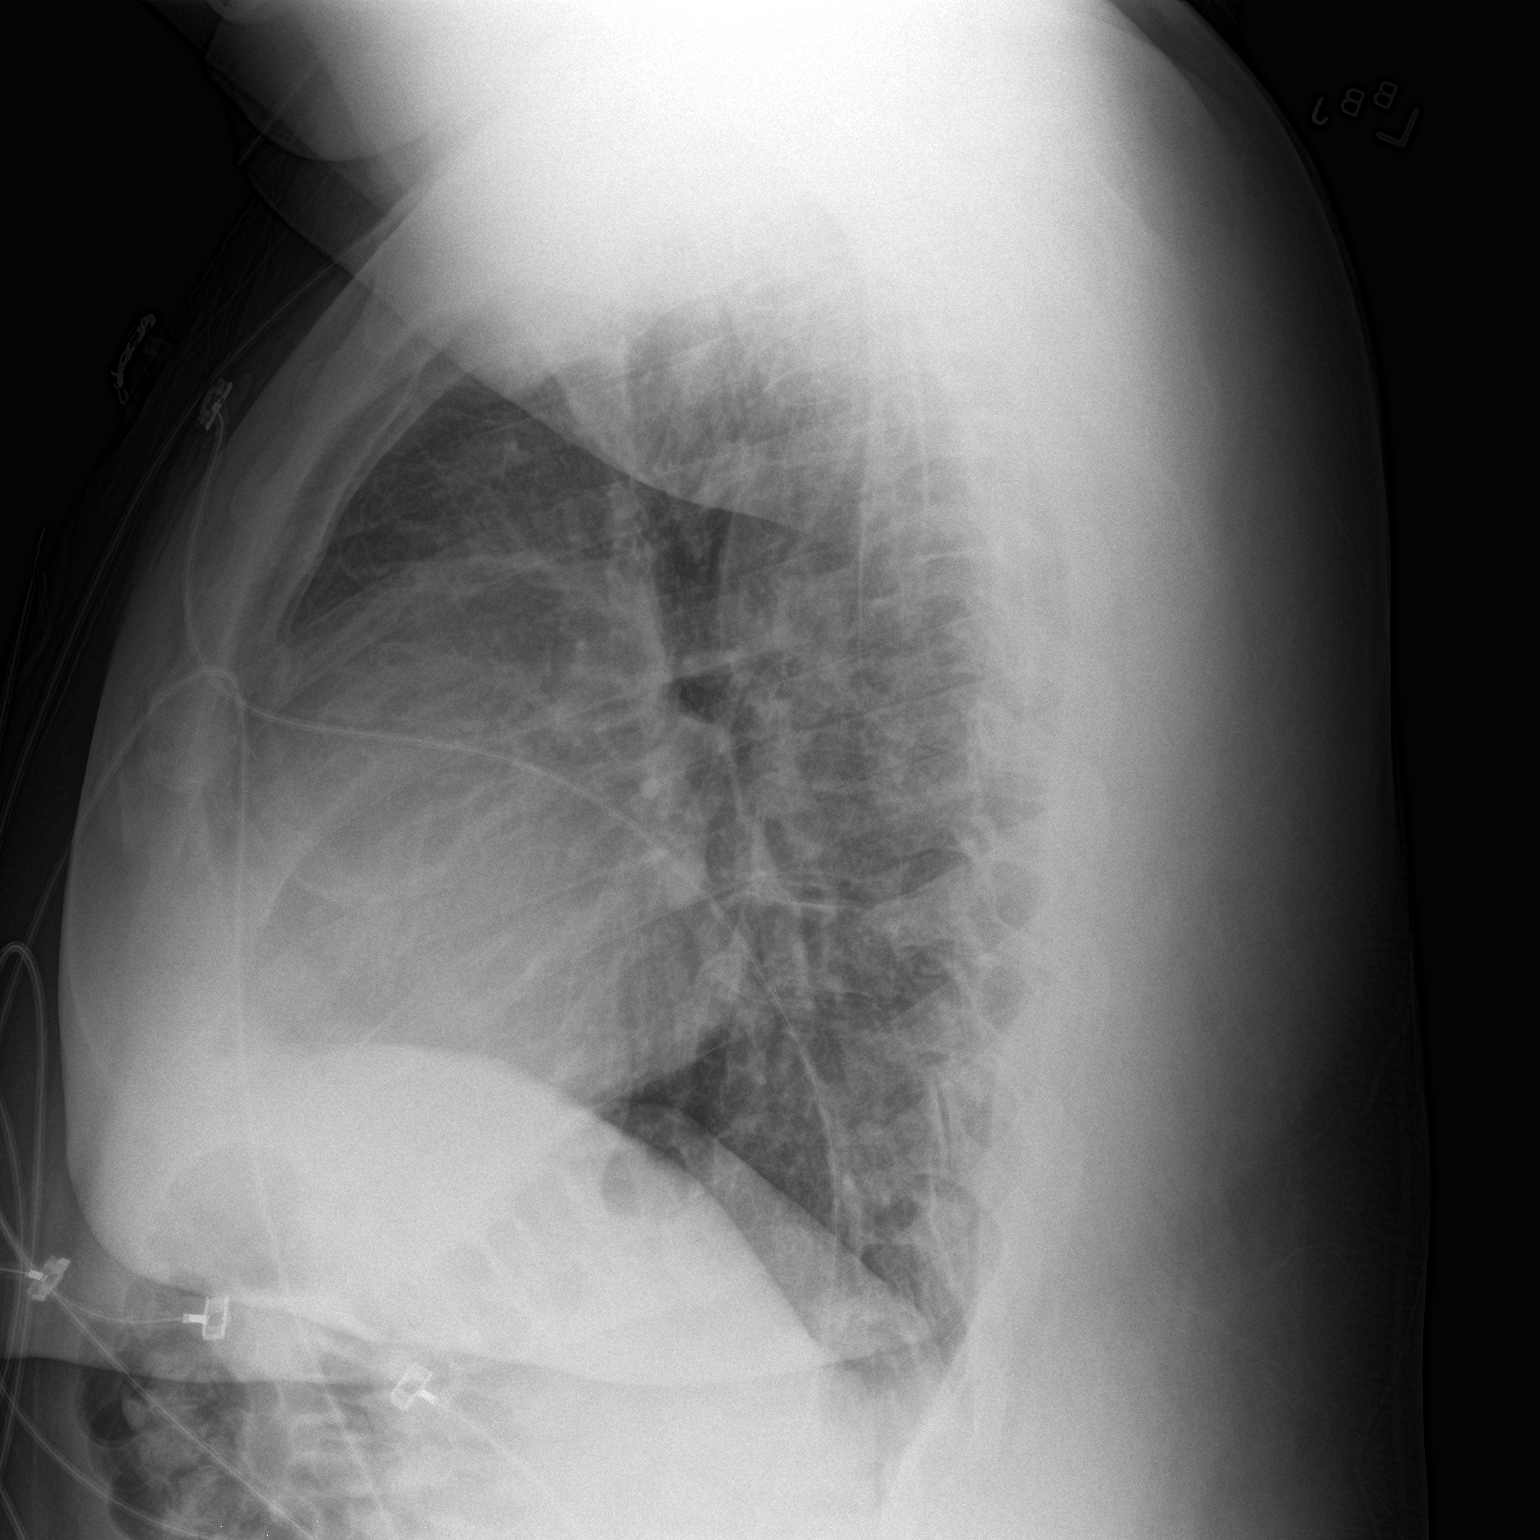

[2 of 2 positions shown; findings below may reference images not displayed]

FINDINGS: Lungs are clear.  No pleural effusion or pneumothorax.

The heart is normal in size.

Visualized osseous structures are within normal limits.
IMPRESSION: No evidence of acute cardiopulmonary disease.

## 2017-10-19 ENCOUNTER — Emergency Department (HOSPITAL_COMMUNITY)
Admission: EM | Admit: 2017-10-19 | Discharge: 2017-10-19 | Disposition: A | Discharge status: Home / Self Care | Payer: Self-pay | Attending: Emergency Medicine | Admitting: Emergency Medicine

## 2017-10-19 ENCOUNTER — Encounter (HOSPITAL_COMMUNITY): Payer: Self-pay

## 2017-10-19 ENCOUNTER — Other Ambulatory Visit: Payer: Self-pay

## 2017-10-19 DIAGNOSIS — R609 Edema, unspecified: Secondary | ICD-10-CM

## 2017-10-19 DIAGNOSIS — Z87891 Personal history of nicotine dependence: Secondary | ICD-10-CM | POA: Insufficient documentation

## 2017-10-19 DIAGNOSIS — G4700 Insomnia, unspecified: Secondary | ICD-10-CM | POA: Insufficient documentation

## 2017-10-19 DIAGNOSIS — J45909 Unspecified asthma, uncomplicated: Secondary | ICD-10-CM | POA: Insufficient documentation

## 2017-10-19 DIAGNOSIS — I10 Essential (primary) hypertension: Secondary | ICD-10-CM | POA: Insufficient documentation

## 2017-10-19 LAB — CBG MONITORING, ED: Glucose-Capillary: 119 mg/dL — ABNORMAL HIGH (ref 70–99)

## 2017-10-19 MED ORDER — ZOLPIDEM TARTRATE 5 MG PO TABS: 5.0000 mg | ORAL_TABLET | Freq: Every evening | ORAL | 0 refills | Status: DC | PRN

## 2017-10-19 NOTE — ED Provider Notes (Signed)
[facility] EMERGENCY DEPARTMENT Provider Note   CSN: [id number] Arrival date & time: 10/19/17  4098     History   Chief Complaint Chief Complaint  Patient presents with  . Joint Swelling    HPI Bobby Bridges is a 42 y.o. male.  Patient complains of insomnia for the past [redacted] weeks along with ankle swelling.  No substernal chest pain, dyspnea, fever, sweats, chills, dysuria.  Past medical history includes obesity and "a heart problem", seizure disorder, depression, anxiety, sleep apnea.Marland Kitchen  He takes no medications.  No smoking or drinking.  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  Is also concerned about his inability to ejaculate.  Severity of sxs is mild.  Nothing makes symptoms better or worse.  He is also concerned about the possibility of diabetes.     Past Medical History:  Diagnosis Date  . Anxiety   . Asthma   . Chronic back pain   . Depression   . GERD (gastroesophageal reflux disease)   . Hearing loss in left ear   . Hypertension   . Seizures (HCC)    outgrew by age 58  . Sleep apnea sleep apnea    Patient Active Problem List   Diagnosis Date Noted  . Elevated troponin 09/22/2014  . Nausea 09/22/2014  . Tick bite 09/22/2014  . Major depression 02/04/2012  . Major depressive disorder, recurrent episode (HCC) 12/02/2011    Class: Acute  . REDNESS OR DISCHARGE OF EYE 02/03/2008  . ALOPECIA 02/03/2008  . HYPERLIPIDEMIA 06/27/2006  . ALLERGIC RHINITIS, SEASONAL 06/27/2006  . SLEEP APNEA 06/27/2006  . HYPERGLYCEMIA 06/27/2006  . OBESITY NOS 04/23/2006  . DEPRESSION 04/23/2006  . HYPERTENSION 04/23/2006  . ALLERGIC RHINITIS 04/23/2006  . BRONCHITIS NOS 04/23/2006  . ASTHMA 04/23/2006  . GERD 04/23/2006  . SEIZURE DISORDER 04/23/2006    History reviewed. No pertinent surgical history.      Home Medications    Prior to Admission medications   Medication Sig Start Date End Date Taking? Authorizing Provider  zolpidem (AMBIEN) 5 MG tablet Take 1 tablet  (5 mg total) by mouth at bedtime as needed for sleep. 10/19/17   Donnetta Hutching, MD    Family History Family History  Problem Relation Age of Onset  . Cancer Mother   . Hypertension Mother   . Cancer Father   . Hypertension Father   . Heart disease Other        "before 41 years old"    Social History Social History   Tobacco Use  . Smoking status: Former Smoker    Packs/day: 1.00    Years: 2.00    Pack years: 2.00    Types: Cigarettes    Last attempt to quit: 04/08/2012    Years since quitting: 5.5  . Smokeless tobacco: Never Used  Substance Use Topics  . Alcohol use: No  . Drug use: No     Allergies   Patient has no known allergies.   Review of Systems Review of Systems  All other systems reviewed and are negative.    Physical Exam Updated Vital Signs BP (!) 124/94 (BP Location: Left Arm)   Pulse (S) (!) 106   Temp 97.7 F (36.5 C) (Oral)   Resp 18   Ht 6\' 1"  (1.854 m)   Wt (!) 153.3 kg (338 lb)   SpO2 98%   BMI 44.59 kg/m   Physical Exam  Constitutional: He is oriented to person, place, and time.  Obese, nad  HENT:  Head: Normocephalic and atraumatic.  Eyes: Conjunctivae are normal.  Neck: Neck supple.  Cardiovascular: Normal rate and regular rhythm.  Pulmonary/Chest: Effort normal and breath sounds normal.  Abdominal: Soft. Bowel sounds are normal.  Musculoskeletal: Normal range of motion.  Neurological: He is alert and oriented to person, place, and time.  Skin: Skin is warm and dry.  Psychiatric: He has a normal mood and affect. His behavior is normal.  Nursing note and vitals reviewed.    ED Treatments / Results  Labs (all labs ordered are listed, but only abnormal results are displayed) Labs Reviewed  CBG MONITORING, ED - Abnormal; Notable for the following components:      Result Value   Glucose-Capillary 119 (*)    All other components within normal limits    EKG None  Radiology No results found.  Procedures Procedures  (including critical care time)  Medications Ordered in ED Medications - No data to display   Initial Impression / Assessment and Plan / ED Course  I have reviewed the triage vital signs and the nursing notes.  Pertinent labs & imaging results that were available during my care of the patient were reviewed by me and considered in my medical decision making (see chart for details).     Patient presents with insomnia and slightly swollen ankles.  Otherwise, physical exam is normal other than his obesity.  Glucose 119.  Discussed nutritional and weight loss suggestions.  Discharge medication Ambien 5 mg.  Final Clinical Impressions(s) / ED Diagnoses   Final diagnoses:  Edema, unspecified type  Insomnia, unspecified type    ED Discharge Orders        Ordered    zolpidem (AMBIEN) 5 MG tablet  At bedtime PRN     10/19/17 0935       Donnetta Hutchingook, Artha Stavros, MD 10/19/17 [phone]

## 2017-10-19 NOTE — ED Triage Notes (Addendum)
Pt states he has had some ankle swelling that has been going on for "awhile." States he is not able to sleep at night, but is unsure why. Has not been to a PCP in a "while" Pt also wants to be checked for DM.

## 2018-02-04 ENCOUNTER — Encounter (HOSPITAL_COMMUNITY): Payer: Self-pay | Admitting: Emergency Medicine

## 2018-02-04 ENCOUNTER — Emergency Department (HOSPITAL_COMMUNITY)
Admission: EM | Admit: 2018-02-04 | Discharge: 2018-02-05 | Disposition: A | Discharge status: Home / Self Care | Payer: Self-pay | Attending: Emergency Medicine | Admitting: Emergency Medicine

## 2018-02-04 ENCOUNTER — Other Ambulatory Visit: Payer: Self-pay

## 2018-02-04 ENCOUNTER — Emergency Department (HOSPITAL_COMMUNITY): Payer: Self-pay

## 2018-02-04 DIAGNOSIS — M545 Low back pain, unspecified: Secondary | ICD-10-CM

## 2018-02-04 DIAGNOSIS — Z87891 Personal history of nicotine dependence: Secondary | ICD-10-CM | POA: Insufficient documentation

## 2018-02-04 DIAGNOSIS — I10 Essential (primary) hypertension: Secondary | ICD-10-CM | POA: Insufficient documentation

## 2018-02-04 DIAGNOSIS — M533 Sacrococcygeal disorders, not elsewhere classified: Secondary | ICD-10-CM | POA: Insufficient documentation

## 2018-02-04 DIAGNOSIS — J45909 Unspecified asthma, uncomplicated: Secondary | ICD-10-CM | POA: Insufficient documentation

## 2018-02-04 MED ORDER — DIAZEPAM 5 MG PO TABS: 5.0000 mg | ORAL_TABLET | Freq: Two times a day (BID) | ORAL | 0 refills | Status: DC

## 2018-02-04 MED ORDER — KETOROLAC TROMETHAMINE 30 MG/ML IJ SOLN
30.0000 mg | Freq: Once | INTRAMUSCULAR | Status: AC
  Administered 2018-02-04: 30 mg via INTRAMUSCULAR
  Filled 2018-02-04: qty 1

## 2018-02-04 MED ORDER — HYDROCODONE-ACETAMINOPHEN 5-325 MG PO TABS: 1.0000 | ORAL_TABLET | ORAL | 0 refills | Status: DC | PRN

## 2018-02-04 MED ORDER — DIAZEPAM 5 MG PO TABS
5.0000 mg | ORAL_TABLET | Freq: Once | ORAL | Status: AC
  Administered 2018-02-04: 5 mg via ORAL
  Filled 2018-02-04: qty 1

## 2018-02-04 NOTE — ED Triage Notes (Signed)
Pt c/o lumbar and sacral back pain x 1 week after falling in the bathroom tile floor, denies LOC or hitting head

## 2018-02-04 NOTE — ED Provider Notes (Signed)
[name] EMERGENCY DEPARTMENT Provider Note   CSN: [id number] Arrival date & time: 02/04/18  2131     History   Chief Complaint Chief Complaint  Patient presents with  . Back Pain    HPI Bobby Bridges is a 42 y.o. male.  Pt presents to the ED today with back pain.  He said he fell about 1 week ago while at work.  He fell onto a tile floor.  He c/o low back and tailbone pain.  He said he's been unable to sleep at night due to the pain.  He denies hitting his head or loc.  He has been able to walk.  No bowel or bladder problems.     Past Medical History:  Diagnosis Date  . Anxiety   . Asthma   . Chronic back pain   . Depression   . GERD (gastroesophageal reflux disease)   . Hearing loss in left ear   . Hypertension   . Seizures (HCC)    outgrew by age 30  . Sleep apnea sleep apnea    Patient Active Problem List   Diagnosis Date Noted  . Elevated troponin 09/22/2014  . Nausea 09/22/2014  . Tick bite 09/22/2014  . Major depression 02/04/2012  . Major depressive disorder, recurrent episode (HCC) 12/02/2011    Class: Acute  . REDNESS OR DISCHARGE OF EYE 02/03/2008  . ALOPECIA 02/03/2008  . HYPERLIPIDEMIA 06/27/2006  . ALLERGIC RHINITIS, SEASONAL 06/27/2006  . SLEEP APNEA 06/27/2006  . HYPERGLYCEMIA 06/27/2006  . OBESITY NOS 04/23/2006  . DEPRESSION 04/23/2006  . HYPERTENSION 04/23/2006  . ALLERGIC RHINITIS 04/23/2006  . BRONCHITIS NOS 04/23/2006  . ASTHMA 04/23/2006  . GERD 04/23/2006  . SEIZURE DISORDER 04/23/2006    History reviewed. No pertinent surgical history.      Home Medications    Prior to Admission medications   Medication Sig Start Date End Date Taking? Authorizing Provider  acetaminophen (TYLENOL) 500 MG tablet Take 500 mg by mouth every 6 (six) hours as needed for mild pain or moderate pain.   Yes [provider]  zolpidem (AMBIEN) 5 MG tablet Take 1 tablet (5 mg total) by mouth at bedtime as needed for sleep. 10/19/17   Yes Donnetta Hutching, MD  diazepam (VALIUM) 5 MG tablet Take 1 tablet (5 mg total) by mouth 2 (two) times daily. 02/04/18   Jacalyn Lefevre, MD  HYDROcodone-acetaminophen (NORCO/VICODIN) 5-325 MG tablet Take 1 tablet by mouth every 4 (four) hours as needed. 02/04/18   Jacalyn Lefevre, MD    Family History Family History  Problem Relation Age of Onset  . Cancer Mother   . Hypertension Mother   . Cancer Father   . Hypertension Father   . Heart disease Other        "before 41 years old"    Social History Social History   Tobacco Use  . Smoking status: Former Smoker    Packs/day: 1.00    Years: 2.00    Pack years: 2.00    Types: Cigarettes    Last attempt to quit: 04/08/2012    Years since quitting: 5.8  . Smokeless tobacco: Never Used  Substance Use Topics  . Alcohol use: No  . Drug use: No     Allergies   Patient has no known allergies.   Review of Systems Review of Systems  Musculoskeletal: Positive for back pain.  All other systems reviewed and are negative.    Physical Exam Updated Vital Signs BP (!) 143/103 (  BP Location: Right Wrist)   Pulse 82   Temp (!) 97.5 F (36.4 C) (Oral)   Resp 18   Wt (!) 177.4 kg   SpO2 100%   BMI 51.59 kg/m   Physical Exam  Constitutional: He is oriented to person, place, and time. He appears well-developed and well-nourished.  HENT:  Head: Normocephalic and atraumatic.  Right Ear: External ear normal.  Left Ear: External ear normal.  Nose: Nose normal.  Mouth/Throat: Oropharynx is clear and moist.  Eyes: Pupils are equal, round, and reactive to light. Conjunctivae and EOM are normal.  Neck: Normal range of motion. Neck supple.  Cardiovascular: Normal rate, regular rhythm, normal heart sounds and intact distal pulses.  Pulmonary/Chest: Effort normal and breath sounds normal.  Abdominal: Soft. Bowel sounds are normal.  Musculoskeletal:       Lumbar back: He exhibits tenderness.       Back:  Neurological: He is alert  and oriented to person, place, and time.  Skin: Skin is warm. Capillary refill takes less than 2 seconds.  Psychiatric: He has a normal mood and affect.  Nursing note and vitals reviewed.    ED Treatments / Results  Labs (all labs ordered are listed, but only abnormal results are displayed) Labs Reviewed - No data to display  EKG None  Radiology Dg Lumbar Spine Complete  Result Date: 02/04/2018 CLINICAL DATA:  Low back pain radiating to sacrococcygeal region since falling a week ago. EXAM: LUMBAR SPINE - COMPLETE 4+ VIEW COMPARISON:  02/02/2013 FINDINGS: Mild curvature of the lumbar spine convex left unchanged. Vertebral body heights are within normal. There is moderate spondylosis throughout the lumbar spine to include facet arthropathy. No acute compression fracture or subluxation. Remainder of the exam is unchanged. IMPRESSION: No acute findings. Moderate spondylosis of the lumbar spine. Electronically Signed   By: Elberta Fortis M.D.   On: 02/04/2018 23:07   Dg Sacrum/coccyx  Result Date: 02/04/2018 CLINICAL DATA:  Low back pain radiating to sacrococcygeal region since falling 1 week ago. EXAM: SACRUM AND COCCYX - 2+ VIEW COMPARISON:  None. FINDINGS: Degenerative changes of the lumbosacral spine. No acute sacrococcygeal fracture. Remainder the exam is unremarkable. IMPRESSION: No acute findings. Degenerative change of the lumbosacral spine. Electronically Signed   By: Elberta Fortis M.D.   On: 02/04/2018 23:08    Procedures Procedures (including critical care time)  Medications Ordered in ED Medications  ketorolac (TORADOL) 30 MG/ML injection 30 mg (30 mg Intramuscular Given 02/04/18 2216)  diazepam (VALIUM) tablet 5 mg (5 mg Oral Given 02/04/18 2234)     Initial Impression / Assessment and Plan / ED Course  I have reviewed the triage vital signs and the nursing notes.  Pertinent labs & imaging results that were available during my care of the patient were reviewed by me and  considered in my medical decision making (see chart for details).    Pain has improved with treatment of toradol and valium.  He will be d/c home with (10) lortab and (10) valium.  He knows to return if worse and to f/u with pcp.  Final Clinical Impressions(s) / ED Diagnoses   Final diagnoses:  Acute bilateral low back pain without sciatica    ED Discharge Orders         Ordered    HYDROcodone-acetaminophen (NORCO/VICODIN) 5-325 MG tablet  Every 4 hours PRN     02/04/18 2311    diazepam (VALIUM) 5 MG tablet  2 times daily  02/04/18 2311           Jacalyn Lefevre, MD 02/04/18 2315

## 2018-02-07 ENCOUNTER — Encounter (HOSPITAL_COMMUNITY): Payer: Self-pay | Admitting: Emergency Medicine

## 2018-02-07 ENCOUNTER — Emergency Department (HOSPITAL_COMMUNITY)
Admission: EM | Admit: 2018-02-07 | Discharge: 2018-02-07 | Disposition: A | Discharge status: Home / Self Care | Payer: Self-pay | Attending: Emergency Medicine | Admitting: Emergency Medicine

## 2018-02-07 ENCOUNTER — Emergency Department (HOSPITAL_COMMUNITY): Payer: Self-pay

## 2018-02-07 ENCOUNTER — Other Ambulatory Visit: Payer: Self-pay

## 2018-02-07 DIAGNOSIS — R1031 Right lower quadrant pain: Secondary | ICD-10-CM | POA: Insufficient documentation

## 2018-02-07 DIAGNOSIS — I10 Essential (primary) hypertension: Secondary | ICD-10-CM | POA: Insufficient documentation

## 2018-02-07 DIAGNOSIS — J45909 Unspecified asthma, uncomplicated: Secondary | ICD-10-CM | POA: Insufficient documentation

## 2018-02-07 DIAGNOSIS — Z79899 Other long term (current) drug therapy: Secondary | ICD-10-CM | POA: Insufficient documentation

## 2018-02-07 DIAGNOSIS — Z87891 Personal history of nicotine dependence: Secondary | ICD-10-CM | POA: Insufficient documentation

## 2018-02-07 DIAGNOSIS — R109 Unspecified abdominal pain: Secondary | ICD-10-CM

## 2018-02-07 LAB — BASIC METABOLIC PANEL
Anion gap: 5 (ref 5–15)
BUN: 14 mg/dL (ref 6–20)
CHLORIDE: 106 mmol/L (ref 98–111)
CO2: 26 mmol/L (ref 22–32)
CREATININE: 1.19 mg/dL (ref 0.61–1.24)
Calcium: 8.5 mg/dL — ABNORMAL LOW (ref 8.9–10.3)
GFR calc non Af Amer: >60 mL/min (ref >60)
Glucose, Bld: 102 mg/dL — ABNORMAL HIGH (ref 70–99)
Potassium: 3.7 mmol/L (ref 3.5–5.1)
SODIUM: 137 mmol/L (ref 135–145)

## 2018-02-07 LAB — CBC WITH DIFFERENTIAL/PLATELET
Abs Immature Granulocytes: 0.02 10*3/uL (ref 0.00–0.07)
BASOS ABS: 0 10*3/uL (ref 0.0–0.1)
Basophils Relative: 1 %
EOS ABS: 0.1 10*3/uL (ref 0.0–0.5)
EOS PCT: 2 %
HCT: 43.1 % (ref 39.0–52.0)
HEMOGLOBIN: 13.2 g/dL (ref 13.0–17.0)
IMMATURE GRANULOCYTES: 0 %
LYMPHS PCT: 34 %
Lymphs Abs: 2 10*3/uL (ref 0.7–4.0)
MCH: 25.5 pg — ABNORMAL LOW (ref 26.0–34.0)
MCHC: 30.6 g/dL (ref 30.0–36.0)
MCV: 83.4 fL (ref 80.0–100.0)
Monocytes Absolute: 0.6 10*3/uL (ref 0.1–1.0)
Monocytes Relative: 11 %
NEUTROS PCT: 52 %
NRBC: 0 % (ref 0.0–0.2)
Neutro Abs: 3 10*3/uL (ref 1.7–7.7)
Platelets: 222 10*3/uL (ref 150–400)
RBC: 5.17 MIL/uL (ref 4.22–5.81)
RDW: 13.2 % (ref 11.5–15.5)
WBC: 5.8 10*3/uL (ref 4.0–10.5)

## 2018-02-07 LAB — URINALYSIS, ROUTINE W REFLEX MICROSCOPIC
Bacteria, UA: NONE SEEN
Bilirubin Urine: NEGATIVE
GLUCOSE, UA: NEGATIVE mg/dL
HGB URINE DIPSTICK: NEGATIVE
Ketones, ur: NEGATIVE mg/dL
NITRITE: NEGATIVE
PH: 5 (ref 5.0–8.0)
Protein, ur: NEGATIVE mg/dL
SPECIFIC GRAVITY, URINE: 1.023 (ref 1.005–1.030)

## 2018-02-07 MED ORDER — ONDANSETRON HCL 4 MG/2ML IJ SOLN
4.0000 mg | Freq: Once | INTRAMUSCULAR | Status: AC
  Administered 2018-02-07: 4 mg via INTRAVENOUS
  Filled 2018-02-07: qty 2

## 2018-02-07 MED ORDER — MORPHINE SULFATE (PF) 4 MG/ML IV SOLN
4.0000 mg | Freq: Once | INTRAVENOUS | Status: AC
  Administered 2018-02-07: 4 mg via INTRAVENOUS
  Filled 2018-02-07: qty 1

## 2018-02-07 MED ORDER — SODIUM CHLORIDE 0.9 % IV BOLUS
1000.0000 mL | Freq: Once | INTRAVENOUS | Status: AC
  Administered 2018-02-07: 1000 mL via INTRAVENOUS

## 2018-02-07 MED ORDER — OXYCODONE-ACETAMINOPHEN 5-325 MG PO TABS: 1.0000 | ORAL_TABLET | ORAL | 0 refills | Status: DC | PRN

## 2018-02-07 NOTE — ED Provider Notes (Signed)
[facility] EMERGENCY DEPARTMENT Provider Note   CSN: [id number] Arrival date & time: 02/07/18  1442     History   Chief Complaint Chief Complaint  Patient presents with  . Flank Pain    HPI Bobby Bridges is a 42 y.o. male.  Pt presents to the ED today with right sided flank pain.  He also said that it hurts to urinate.  The pt said he fell about 1-2 weeks ago.  He was here on 10/29 and was seen by me.  Xrays of lumbar, sacral, coccyx were negative then.  The pt was given rx for lortab and valium.  He said he's taken those, but sx are not improving.  He did not mention urinary sx on the 29th.  No vomiting, but nausea.  No f/c.  No hematuria.  No difficulties urinating.  He is ambulatory.     Past Medical History:  Diagnosis Date  . Anxiety   . Asthma   . Chronic back pain   . Depression   . GERD (gastroesophageal reflux disease)   . Hearing loss in left ear   . Hypertension   . Seizures (HCC)    outgrew by age 72  . Sleep apnea sleep apnea    Patient Active Problem List   Diagnosis Date Noted  . Elevated troponin 09/22/2014  . Nausea 09/22/2014  . Tick bite 09/22/2014  . Major depression 02/04/2012  . Major depressive disorder, recurrent episode (HCC) 12/02/2011    Class: Acute  . REDNESS OR DISCHARGE OF EYE 02/03/2008  . ALOPECIA 02/03/2008  . HYPERLIPIDEMIA 06/27/2006  . ALLERGIC RHINITIS, SEASONAL 06/27/2006  . SLEEP APNEA 06/27/2006  . HYPERGLYCEMIA 06/27/2006  . OBESITY NOS 04/23/2006  . DEPRESSION 04/23/2006  . HYPERTENSION 04/23/2006  . ALLERGIC RHINITIS 04/23/2006  . BRONCHITIS NOS 04/23/2006  . ASTHMA 04/23/2006  . GERD 04/23/2006  . SEIZURE DISORDER 04/23/2006    History reviewed. No pertinent surgical history.      Home Medications    Prior to Admission medications   Medication Sig Start Date End Date Taking? Authorizing Provider  acetaminophen (TYLENOL) 500 MG tablet Take 500 mg by mouth every 6 (six) hours as needed for mild  pain or moderate pain.    [provider]  diazepam (VALIUM) 5 MG tablet Take 1 tablet (5 mg total) by mouth 2 (two) times daily. 02/04/18   Jacalyn Lefevre, MD  HYDROcodone-acetaminophen (NORCO/VICODIN) 5-325 MG tablet Take 1 tablet by mouth every 4 (four) hours as needed. 02/04/18   Jacalyn Lefevre, MD  oxyCODONE-acetaminophen (PERCOCET/ROXICET) 5-325 MG tablet Take 1 tablet by mouth every 4 (four) hours as needed for severe pain. 02/07/18   Jacalyn Lefevre, MD  zolpidem (AMBIEN) 5 MG tablet Take 1 tablet (5 mg total) by mouth at bedtime as needed for sleep. 10/19/17   Donnetta Hutching, MD    Family History Family History  Problem Relation Age of Onset  . Cancer Mother   . Hypertension Mother   . Cancer Father   . Hypertension Father   . Heart disease Other        "before 41 years old"    Social History Social History   Tobacco Use  . Smoking status: Former Smoker    Packs/day: 1.00    Years: 2.00    Pack years: 2.00    Types: Cigarettes    Last attempt to quit: 04/08/2012    Years since quitting: 5.8  . Smokeless tobacco: Never Used  Substance Use Topics  .  Alcohol use: No  . Drug use: No     Allergies   Patient has no known allergies.   Review of Systems Review of Systems  Genitourinary: Positive for dysuria and frequency.  All other systems reviewed and are negative.    Physical Exam Updated Vital Signs BP (!) 133/114   Pulse 81   Temp 97.8 F (36.6 C) (Oral)   Resp 17   Ht 6\' 1"  (1.854 m)   Wt (!) 138.8 kg   SpO2 99%   BMI 40.37 kg/m   Physical Exam  Constitutional: He is oriented to person, place, and time. He appears well-developed and well-nourished.  HENT:  Head: Normocephalic and atraumatic.  Right Ear: External ear normal.  Left Ear: External ear normal.  Nose: Nose normal.  Mouth/Throat: Oropharynx is clear and moist.  Eyes: Pupils are equal, round, and reactive to light. Conjunctivae and EOM are normal.  Neck: Normal range of  motion. Neck supple.  Cardiovascular: Normal rate, regular rhythm, normal heart sounds and intact distal pulses.  Pulmonary/Chest: Effort normal and breath sounds normal.  Abdominal: Soft. Bowel sounds are normal.  Musculoskeletal:       Arms: Neurological: He is alert and oriented to person, place, and time.  Skin: Skin is warm. Capillary refill takes less than 2 seconds.  Psychiatric: He has a normal mood and affect. His behavior is normal. Judgment and thought content normal.  Nursing note and vitals reviewed.    ED Treatments / Results  Labs (all labs ordered are listed, but only abnormal results are displayed) Labs Reviewed  BASIC METABOLIC PANEL - Abnormal; Notable for the following components:      Result Value   Glucose, Bld 102 (*)    Calcium 8.5 (*)    All other components within normal limits  CBC WITH DIFFERENTIAL/PLATELET - Abnormal; Notable for the following components:   MCH 25.5 (*)    All other components within normal limits  URINALYSIS, ROUTINE W REFLEX MICROSCOPIC - Abnormal; Notable for the following components:   Leukocytes, UA TRACE (*)    All other components within normal limits    EKG None  Radiology Ct Renal Stone Study  Result Date: 02/07/2018 CLINICAL DATA:  Acute right flank pain. EXAM: CT ABDOMEN AND PELVIS WITHOUT CONTRAST TECHNIQUE: Multidetector CT imaging of the abdomen and pelvis was performed following the standard protocol without IV contrast. COMPARISON:  None. FINDINGS: Lower chest: No acute abnormality. Hepatobiliary: No focal liver abnormality is seen. No gallstones, gallbladder wall thickening, or biliary dilatation. Pancreas: Unremarkable. No pancreatic ductal dilatation or surrounding inflammatory changes. Spleen: Normal in size without focal abnormality. Adrenals/Urinary Tract: Adrenal glands are unremarkable. Kidneys are normal, without renal calculi, focal lesion, or hydronephrosis. Bladder is unremarkable. Stomach/Bowel: Stomach is  within normal limits. Appendix appears normal. No evidence of bowel wall thickening, distention, or inflammatory changes. Vascular/Lymphatic: No significant vascular findings are present. No enlarged abdominal or pelvic lymph nodes. Reproductive: Prostate is unremarkable. Other: No abdominal wall hernia or abnormality. No abdominopelvic ascites. Musculoskeletal: No acute or significant osseous findings. IMPRESSION: No abnormality seen in the abdomen or pelvis. Electronically Signed   By: Lupita Raider, M.D.   On: 02/07/2018 16:23    Procedures Procedures (including critical care time)  Medications Ordered in ED Medications  sodium chloride 0.9 % bolus 1,000 mL (1,000 mLs Intravenous New Bag/Given 02/07/18 1540)  ondansetron (ZOFRAN) injection 4 mg (4 mg Intravenous Given 02/07/18 1541)  morphine 4 MG/ML injection 4 mg (4 mg  Intravenous Given 02/07/18 1541)     Initial Impression / Assessment and Plan / ED Course  I have reviewed the triage vital signs and the nursing notes.  Pertinent labs & imaging results that were available during my care of the patient were reviewed by me and considered in my medical decision making (see chart for details).    No abnormalities seen on CT.  The pt does not have an UTI.  He denies being sexually active and does not want to be checked for STD.  We talked about diet and exercise.  He is encouraged to establish primary care.  Return if worse.  Final Clinical Impressions(s) / ED Diagnoses   Final diagnoses:  Right flank pain    ED Discharge Orders         Ordered    oxyCODONE-acetaminophen (PERCOCET/ROXICET) 5-325 MG tablet  Every 4 hours PRN     02/07/18 1635           Jacalyn Lefevre, MD 02/07/18 1637

## 2018-02-07 NOTE — ED Notes (Signed)
Patient transported to CT 

## 2018-02-07 NOTE — ED Triage Notes (Signed)
Patient complaining of right flank pain and dysuria x 2 weeks.

## 2018-05-09 ENCOUNTER — Other Ambulatory Visit: Payer: Self-pay

## 2018-05-09 ENCOUNTER — Emergency Department (HOSPITAL_COMMUNITY)
Admission: EM | Admit: 2018-05-09 | Discharge: 2018-05-09 | Disposition: A | Discharge status: Home / Self Care | Payer: Self-pay | Attending: Emergency Medicine | Admitting: Emergency Medicine

## 2018-05-09 ENCOUNTER — Encounter (HOSPITAL_COMMUNITY): Payer: Self-pay | Admitting: Emergency Medicine

## 2018-05-09 DIAGNOSIS — J45909 Unspecified asthma, uncomplicated: Secondary | ICD-10-CM | POA: Insufficient documentation

## 2018-05-09 DIAGNOSIS — Z87891 Personal history of nicotine dependence: Secondary | ICD-10-CM | POA: Insufficient documentation

## 2018-05-09 DIAGNOSIS — J111 Influenza due to unidentified influenza virus with other respiratory manifestations: Secondary | ICD-10-CM | POA: Insufficient documentation

## 2018-05-09 DIAGNOSIS — I10 Essential (primary) hypertension: Secondary | ICD-10-CM | POA: Insufficient documentation

## 2018-05-09 DIAGNOSIS — Z79899 Other long term (current) drug therapy: Secondary | ICD-10-CM | POA: Insufficient documentation

## 2018-05-09 MED ORDER — OSELTAMIVIR PHOSPHATE 75 MG PO CAPS: 75.0000 mg | ORAL_CAPSULE | Freq: Two times a day (BID) | ORAL | 0 refills | Status: DC

## 2018-05-09 MED ORDER — ONDANSETRON 4 MG PO TBDP: 4.0000 mg | ORAL_TABLET | Freq: Three times a day (TID) | ORAL | 0 refills | Status: DC | PRN

## 2018-05-09 MED ORDER — ACETAMINOPHEN 325 MG PO TABS
650.0000 mg | ORAL_TABLET | Freq: Once | ORAL | Status: AC | PRN
  Administered 2018-05-09: 650 mg via ORAL
  Filled 2018-05-09: qty 2

## 2018-05-09 MED ORDER — SODIUM CHLORIDE 0.9 % IV BOLUS
1000.0000 mL | Freq: Once | INTRAVENOUS | Status: AC
  Administered 2018-05-09: 1000 mL via INTRAVENOUS

## 2018-05-09 NOTE — ED Provider Notes (Signed)
[facility] EMERGENCY DEPARTMENT Provider Note   CSN: [id number] Arrival date & time: 05/09/18  2100     History   Chief Complaint Chief Complaint  Patient presents with  . Influenza    HPI PAT DLOUHY is a 43 y.o. male.  HPI Patient presents with fevers aches sore throat and chills.  Occasional cough.  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  He states he just feels bad all over.  No lightheadedness or dizziness.  Does have a dull headache. Past Medical History:  Diagnosis Date  . Anxiety   . Asthma   . Chronic back pain   . Depression   . GERD (gastroesophageal reflux disease)   . Hearing loss in left ear   . Hypertension   . Seizures (HCC)    outgrew by age 29  . Sleep apnea sleep apnea    Patient Active Problem List   Diagnosis Date Noted  . Elevated troponin 09/22/2014  . Nausea 09/22/2014  . Tick bite 09/22/2014  . Major depression 02/04/2012  . Major depressive disorder, recurrent episode (HCC) 12/02/2011    Class: Acute  . REDNESS OR DISCHARGE OF EYE 02/03/2008  . ALOPECIA 02/03/2008  . HYPERLIPIDEMIA 06/27/2006  . ALLERGIC RHINITIS, SEASONAL 06/27/2006  . SLEEP APNEA 06/27/2006  . HYPERGLYCEMIA 06/27/2006  . OBESITY NOS 04/23/2006  . DEPRESSION 04/23/2006  . HYPERTENSION 04/23/2006  . ALLERGIC RHINITIS 04/23/2006  . BRONCHITIS NOS 04/23/2006  . ASTHMA 04/23/2006  . GERD 04/23/2006  . SEIZURE DISORDER 04/23/2006    History reviewed. No pertinent surgical history.      Home Medications    Prior to Admission medications   Medication Sig Start Date End Date Taking? Authorizing Provider  acetaminophen (TYLENOL) 500 MG tablet Take 500 mg by mouth every 6 (six) hours as needed for mild pain or moderate pain.    [provider]  diazepam (VALIUM) 5 MG tablet Take 1 tablet (5 mg total) by mouth 2 (two) times daily. 02/04/18   Jacalyn Lefevre, MD  HYDROcodone-acetaminophen (NORCO/VICODIN) 5-325 MG tablet  Take 1 tablet by mouth every 4 (four) hours as needed. 02/04/18   Jacalyn Lefevre, MD  ondansetron (ZOFRAN-ODT) 4 MG disintegrating tablet Take 1 tablet (4 mg total) by mouth every 8 (eight) hours as needed for nausea or vomiting. 05/09/18   Benjiman Core, MD  oseltamivir (TAMIFLU) 75 MG capsule Take 1 capsule (75 mg total) by mouth every 12 (twelve) hours. 05/09/18   Benjiman Core, MD  oxyCODONE-acetaminophen (PERCOCET/ROXICET) 5-325 MG tablet Take 1 tablet by mouth every 4 (four) hours as needed for severe pain. 02/07/18   Jacalyn Lefevre, MD  zolpidem (AMBIEN) 5 MG tablet Take 1 tablet (5 mg total) by mouth at bedtime as needed for sleep. 10/19/17   Donnetta Hutching, MD    Family History Family History  Problem Relation Age of Onset  . Cancer Mother   . Hypertension Mother   . Cancer Father   . Hypertension Father   . Heart disease Other        "before 42 years old"    Social History Social History   Tobacco Use  . Smoking status: Former Smoker    Packs/day: 1.00    Years: 2.00    Pack years: 2.00    Types: Cigarettes    Last attempt to quit: 04/08/2012    Years since quitting: 6.0  . Smokeless tobacco: Never Used  Substance Use Topics  .  Alcohol use: No  . Drug use: No     Allergies   Patient has no known allergies.   Review of Systems Review of Systems  Constitutional: Positive for chills, fatigue and fever.  HENT: Positive for sore throat.   Respiratory: Positive for cough.   Cardiovascular: Negative for chest pain.  Gastrointestinal: Negative for abdominal pain.  Genitourinary: Negative for flank pain.  Musculoskeletal: Negative for back pain.  Skin: Negative for rash.  Neurological: Negative for weakness.  Psychiatric/Behavioral: Negative for confusion.     Physical Exam Updated Vital Signs BP (!) 154/92   Pulse (!) 112   Temp 100.2 F (37.9 C)   Resp 20   Ht 6\' 1"  (1.854 m)   Wt (!) 149.7 kg   SpO2 99%   BMI 43.54 kg/m   Physical  Exam Vitals signs and nursing note reviewed.  Constitutional:      Comments: Patient is obese.  HENT:     Head:     Comments: Mild posterior pharyngeal erythema without exudate.    Mouth/Throat:     Mouth: Mucous membranes are moist.  Neck:     Musculoskeletal: Neck supple.  Cardiovascular:     Comments: Tachycardia Pulmonary:     Breath sounds: No wheezing, rhonchi or rales.  Musculoskeletal:     Right lower leg: No edema.     Left lower leg: No edema.  Skin:    General: Skin is warm.     Capillary Refill: Capillary refill takes less than 2 seconds.  Neurological:     Mental Status: He is alert. Mental status is at baseline.      ED Treatments / Results  Labs (all labs ordered are listed, but only abnormal results are displayed) Labs Reviewed - No data to display  EKG None  Radiology No results found.  Procedures Procedures (including critical care time)  Medications Ordered in ED Medications  acetaminophen (TYLENOL) tablet 650 mg (650 mg Oral Given 05/09/18 2117)  sodium chloride 0.9 % bolus 1,000 mL (0 mLs Intravenous Stopped 05/09/18 2250)     Initial Impression / Assessment and Plan / ED Course  I have reviewed the triage vital signs and the nursing notes.  Pertinent labs & imaging results that were available during my care of the patient were reviewed by me and considered in my medical decision making (see chart for details).     Patient with likely influenza.  Fluid bolus given for symptom control.  Patient feels somewhat better after treatment.  Will discharge home and empirically treat with Tamiflu. Final Clinical Impressions(s) / ED Diagnoses   Final diagnoses:  Influenza    ED Discharge Orders         Ordered    oseltamivir (TAMIFLU) 75 MG capsule  Every 12 hours     05/09/18 2241    ondansetron (ZOFRAN-ODT) 4 MG disintegrating tablet  Every 8 hours PRN     05/09/18 2241           Benjiman CorePickering, Latrise Bowland, MD 05/09/18 2254

## 2018-05-09 NOTE — ED Notes (Signed)
Dr Pickering at bedside,  

## 2018-05-09 NOTE — ED Triage Notes (Signed)
Pt states flu like symptoms since yesterday afternoon. 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 Pt has fever, has not taken anything for it.

## 2019-01-26 ENCOUNTER — Other Ambulatory Visit: Payer: Self-pay

## 2019-01-26 ENCOUNTER — Encounter (HOSPITAL_COMMUNITY): Payer: Self-pay | Admitting: Emergency Medicine

## 2019-01-26 ENCOUNTER — Emergency Department (HOSPITAL_COMMUNITY)
Admission: EM | Admit: 2019-01-26 | Discharge: 2019-01-26 | Disposition: A | Discharge status: Home / Self Care | Payer: Self-pay | Attending: Emergency Medicine | Admitting: Emergency Medicine

## 2019-01-26 ENCOUNTER — Emergency Department (HOSPITAL_COMMUNITY): Payer: Self-pay

## 2019-01-26 DIAGNOSIS — J45909 Unspecified asthma, uncomplicated: Secondary | ICD-10-CM | POA: Insufficient documentation

## 2019-01-26 DIAGNOSIS — R600 Localized edema: Secondary | ICD-10-CM | POA: Insufficient documentation

## 2019-01-26 DIAGNOSIS — Z87891 Personal history of nicotine dependence: Secondary | ICD-10-CM | POA: Insufficient documentation

## 2019-01-26 DIAGNOSIS — I10 Essential (primary) hypertension: Secondary | ICD-10-CM | POA: Insufficient documentation

## 2019-01-26 DIAGNOSIS — Z79899 Other long term (current) drug therapy: Secondary | ICD-10-CM | POA: Insufficient documentation

## 2019-01-26 DIAGNOSIS — G4700 Insomnia, unspecified: Secondary | ICD-10-CM | POA: Insufficient documentation

## 2019-01-26 LAB — CBC WITH DIFFERENTIAL/PLATELET
Abs Immature Granulocytes: 0.01 10*3/uL (ref 0.00–0.07)
Basophils Absolute: 0 10*3/uL (ref 0.0–0.1)
Basophils Relative: 0 %
Eosinophils Absolute: 0.1 10*3/uL (ref 0.0–0.5)
Eosinophils Relative: 2 %
HCT: 44.7 % (ref 39.0–52.0)
Hemoglobin: 13.7 g/dL (ref 13.0–17.0)
Immature Granulocytes: 0 %
Lymphocytes Relative: 35 %
Lymphs Abs: 1.6 10*3/uL (ref 0.7–4.0)
MCH: 25.9 pg — ABNORMAL LOW (ref 26.0–34.0)
MCHC: 30.6 g/dL (ref 30.0–36.0)
MCV: 84.7 fL (ref 80.0–100.0)
Monocytes Absolute: 0.4 10*3/uL (ref 0.1–1.0)
Monocytes Relative: 8 %
Neutro Abs: 2.6 10*3/uL (ref 1.7–7.7)
Neutrophils Relative %: 55 %
Platelets: 252 10*3/uL (ref 150–400)
RBC: 5.28 MIL/uL (ref 4.22–5.81)
RDW: 13.5 % (ref 11.5–15.5)
WBC: 4.7 10*3/uL (ref 4.0–10.5)
nRBC: 0 % (ref 0.0–0.2)

## 2019-01-26 LAB — BASIC METABOLIC PANEL
Anion gap: 8 (ref 5–15)
BUN: 14 mg/dL (ref 6–20)
CO2: 22 mmol/L (ref 22–32)
Calcium: 8.5 mg/dL — ABNORMAL LOW (ref 8.9–10.3)
Chloride: 108 mmol/L (ref 98–111)
Creatinine, Ser: 0.93 mg/dL (ref 0.61–1.24)
GFR calc Af Amer: >60 mL/min (ref >60)
GFR calc non Af Amer: >60 mL/min (ref >60)
Glucose, Bld: 100 mg/dL — ABNORMAL HIGH (ref 70–99)
Potassium: 3.7 mmol/L (ref 3.5–5.1)
Sodium: 138 mmol/L (ref 135–145)

## 2019-01-26 MED ORDER — HYDROCHLOROTHIAZIDE 25 MG PO TABS: 25.0000 mg | ORAL_TABLET | Freq: Every day | ORAL | 0 refills | Status: DC

## 2019-01-26 NOTE — ED Provider Notes (Signed)
[facility] EMERGENCY DEPARTMENT Provider Note   CSN: [id number] Arrival date & time: 01/26/19  0865     History   Chief Complaint Chief Complaint  Patient presents with  . Leg Swelling    HPI Bobby Bridges is a 43 y.o. male with a history of obesity, asthma, GERD, HTN (pt reports never treated, but was advised lifestyle changes) and sleep apnea (never treated)  presenting with a several month history of increased bilateral lower extremity edema.  He describes waking with mild edema which worsens during the course of the day, describing severe sock marks at the end of the day.  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  He denies pain in the legs, denies any injuries, also denies chest pain, sob or orthopnea.  He has tried no medications or other treatment for his symptoms.  No current pcp.  Also with complaint of difficulty sleeping.  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   He denies caffeine intake, has tried melatonin tablets without noticeable improvement.  Has been prescribed ambien from here in the past.       The history is provided by the patient.    Past Medical History:  Diagnosis Date  . Anxiety   . Asthma   . Chronic back pain   . Depression   . GERD (gastroesophageal reflux disease)   . Hearing loss in left ear   . Hypertension   . Seizures ([place])    outgrew by age 42  . Sleep apnea sleep apnea    Patient Active Problem List   Diagnosis Date Noted  . Elevated troponin 09/22/2014  . Nausea 09/22/2014  . Tick bite 09/22/2014  . Major depression 02/04/2012  . Major depressive disorder, recurrent episode ([place]) 12/02/2011    Class: Acute  . REDNESS OR DISCHARGE OF EYE 02/03/2008  . ALOPECIA 02/03/2008  . HYPERLIPIDEMIA 06/27/2006  . ALLERGIC RHINITIS, SEASONAL 06/27/2006  . SLEEP APNEA 06/27/2006  . HYPERGLYCEMIA 06/27/2006  .  OBESITY NOS 04/23/2006  . DEPRESSION 04/23/2006  . HYPERTENSION 04/23/2006  . ALLERGIC RHINITIS 04/23/2006  . BRONCHITIS NOS 04/23/2006  . ASTHMA 04/23/2006  . GERD 04/23/2006  . SEIZURE DISORDER 04/23/2006    History reviewed. No pertinent surgical history.      Home Medications    Prior to Admission medications   Medication Sig Start Date End Date Taking? Authorizing Provider  acetaminophen (TYLENOL) 500 MG tablet Take 500 mg by mouth every 6 (six) hours as needed for mild pain or moderate pain.    [provider]  diazepam (VALIUM) 5 MG tablet Take 1 tablet (5 mg total) by mouth 2 (two) times daily. 02/04/18   Isla Pence, MD  HYDROcodone-acetaminophen (NORCO/VICODIN) 5-325 MG tablet Take 1 tablet by mouth every 4 (four) hours as needed. 02/04/18   Isla Pence, MD  ondansetron (ZOFRAN-ODT) 4 MG disintegrating tablet Take 1 tablet (4 mg total) by mouth every 8 (eight) hours as needed for nausea or vomiting. 05/09/18   Davonna Belling, MD  oseltamivir (TAMIFLU) 75 MG capsule Take 1 capsule (75 mg total) by mouth every 12 (twelve) hours. 05/09/18   Davonna Belling, MD  oxyCODONE-acetaminophen (PERCOCET/ROXICET) 5-325 MG tablet Take 1 tablet by mouth every 4 (four) hours as needed for severe pain. 02/07/18   Isla Pence, MD  zolpidem (AMBIEN) 5 MG tablet Take 1 tablet (  5 mg total) by mouth at bedtime as needed for sleep. 10/19/17   Donnetta Hutching, MD    Family History Family History  Problem Relation Age of Onset  . Cancer Mother   . Hypertension Mother   . Cancer Father   . Hypertension Father   . Heart disease Other        "before 42 years old"    Social History Social History   Tobacco Use  . Smoking status: Former Smoker    Packs/day: 1.00    Years: 2.00    Pack years: 2.00    Types: Cigarettes    Quit date: 04/08/2012    Years since quitting: 6.8  . Smokeless tobacco: Never Used  Substance Use Topics  . Alcohol use: No  . Drug use: No      Allergies   Patient has no known allergies.   Review of Systems Review of Systems  Constitutional: Negative for fever.  HENT: Negative for congestion.   Eyes: Negative.   Respiratory: Negative for cough, chest tightness, shortness of breath and wheezing.   Cardiovascular: Positive for leg swelling. Negative for chest pain and palpitations.  Gastrointestinal: Negative for abdominal pain.  Genitourinary: Negative.   Musculoskeletal: Negative for arthralgias, joint swelling and neck pain.  Skin: Negative.  Negative for rash and wound.  Neurological: Negative for weakness, light-headedness and numbness.  Psychiatric/Behavioral: Negative.      Physical Exam Updated Vital Signs BP (!) 135/92 (BP Location: Left Arm)   Pulse 88   Temp 98.9 F (37.2 C) (Oral)   Resp 18   Ht 6\' 1"  (1.854 m)   Wt (!) 181.4 kg   SpO2 98%   BMI 52.77 kg/m   Physical Exam Vitals signs and nursing note reviewed.  Constitutional:      Appearance: He is well-developed. He is obese.  HENT:     Head: Normocephalic and atraumatic.  Eyes:     Conjunctiva/sclera: Conjunctivae normal.  Neck:     Musculoskeletal: Normal range of motion.  Cardiovascular:     Rate and Rhythm: Normal rate and regular rhythm.     Pulses:          Dorsalis pedis pulses are 2+ on the right side and 2+ on the left side.     Heart sounds: Normal heart sounds.     Comments: Right anterior prox tibial vein prominence (spiders), no significant varicosities.  Pulmonary:     Effort: Pulmonary effort is normal.     Breath sounds: Normal breath sounds. No wheezing.  Abdominal:     General: Abdomen is protuberant. Bowel sounds are normal.     Palpations: Abdomen is soft.     Tenderness: There is no abdominal tenderness.  Musculoskeletal: Normal range of motion.     Comments: Bilateral 1+ edema ankles.   Skin:    General: Skin is warm and dry.  Neurological:     Mental Status: He is alert.      ED Treatments / Results   Labs (all labs ordered are listed, but only abnormal results are displayed) Labs Reviewed  BASIC METABOLIC PANEL - Abnormal; Notable for the following components:      Result Value   Glucose, Bld 100 (*)    Calcium 8.5 (*)    All other components within normal limits  CBC WITH DIFFERENTIAL/PLATELET - Abnormal; Notable for the following components:   MCH 25.9 (*)    All other components within normal limits    EKG None  Radiology Dg Chest 2 View  Result Date: 01/26/2019 CLINICAL DATA:  Bilateral ankle swelling EXAM: CHEST - 2 VIEW COMPARISON:  09/22/2016 FINDINGS: Heart is borderline in size. No confluent opacities, effusions or edema. No acute bony abnormality. IMPRESSION: Borderline cardiomegaly.  No active disease. Electronically Signed   By: Charlett NoseKevin  Dover M.D.   On: 01/26/2019 09:07    Procedures Procedures (including critical care time)  Medications Ordered in ED Medications - No data to display   Initial Impression / Assessment and Plan / ED Course  I have reviewed the triage vital signs and the nursing notes.  Pertinent labs & imaging results that were available during my care of the patient were reviewed by me and considered in my medical decision making (see chart for details).        Pt with untreated htn with borderline cardiomegaly, no signs or sx of pulmonary edema. Pt placed on hctz for bp control and for peripheral edema.  Discussed htn, chf and peripheral edema and how they can be inter related.  Recommended elevation, compression stockings.  Given referrals for pcp f/u care, including recommending [facility] for a recheck of bp in one week.  Recommended Unisom for insomnia prn.    The patient appears reasonably screened and/or stabilized for discharge and I doubt any other medical condition or other [facility] requiring further screening, evaluation, or treatment in the ED at this time prior to discharge.   Final Clinical Impressions(s) / ED Diagnoses    Final diagnoses:  Bilateral lower extremity edema  Insomnia, unspecified type    ED Discharge Orders    None       Victoriano Laindol, Addiel Mccardle, PA-C 01/26/19 1056    Eber HongMiller, Brian, MD 01/27/19 [phone]

## 2019-01-26 NOTE — ED Triage Notes (Signed)
Bilateral ankle edema for a couple of months

## 2019-01-26 NOTE — Discharge Instructions (Addendum)
Your lab tests today look fine with no evidence of kidney problems.  Your chest xray is also ok but there is evidence that your blood pressure has been high for a while.  I have prescribed hydrochlorothiazide (HCTZ) which is a fluid pill and will help with swelling, but is also the first line treatment for blood pressure.  This cause you to urinate more than normal until your body adjusts - taking in the morning will reduce your need to urinate at night frequently.  You may try using unisom to help with difficulty sleeping - no prescription is needed for this medicine.   Bobby Bridges  [address] [place], [place] [address] [phone]  Services [facility] offers a variety of basic health services.  Services include but are not limited to: Blood pressure checks  Heart rate checks  Blood sugar checks  Urine analysis  Rapid strep tests  Pregnancy tests.  Health education and referrals  People needing more complex services will be directed to a physician online. Using these virtual visits, doctors can evaluate and prescribe medicine and treatments. There will be no medication on-site, though [facility] will help patients fill their prescriptions at little to no cost.   For More information please go to: [email or web address]

## 2019-04-14 ENCOUNTER — Ambulatory Visit: Payer: Self-pay | Attending: Internal Medicine

## 2019-04-14 ENCOUNTER — Other Ambulatory Visit: Payer: Self-pay

## 2019-04-14 DIAGNOSIS — Z20822 Contact with and (suspected) exposure to covid-19: Secondary | ICD-10-CM | POA: Insufficient documentation

## 2019-04-16 ENCOUNTER — Telehealth: Payer: Self-pay

## 2019-04-16 LAB — NOVEL CORONAVIRUS, NAA: SARS-CoV-2, NAA: NOT DETECTED

## 2019-04-16 NOTE — Telephone Encounter (Signed)
Caller given negative result and verbalized understanding  

## 2019-04-16 NOTE — Telephone Encounter (Signed)
Patient called and requested that his COVID-19 test result 04/14/19 be fax to his employer. [employer]  # [phone] Attn Addison Lank.

## 2019-04-23 ENCOUNTER — Other Ambulatory Visit: Payer: Self-pay

## 2019-04-23 ENCOUNTER — Emergency Department (HOSPITAL_COMMUNITY)
Admission: EM | Admit: 2019-04-23 | Discharge: 2019-04-23 | Disposition: A | Discharge status: Home / Self Care | Payer: Self-pay | Attending: Emergency Medicine | Admitting: Emergency Medicine

## 2019-04-23 ENCOUNTER — Encounter (HOSPITAL_COMMUNITY): Payer: Self-pay | Admitting: *Deleted

## 2019-04-23 DIAGNOSIS — J45909 Unspecified asthma, uncomplicated: Secondary | ICD-10-CM | POA: Insufficient documentation

## 2019-04-23 DIAGNOSIS — I10 Essential (primary) hypertension: Secondary | ICD-10-CM | POA: Insufficient documentation

## 2019-04-23 DIAGNOSIS — Z79899 Other long term (current) drug therapy: Secondary | ICD-10-CM | POA: Insufficient documentation

## 2019-04-23 DIAGNOSIS — L03012 Cellulitis of left finger: Secondary | ICD-10-CM | POA: Insufficient documentation

## 2019-04-23 DIAGNOSIS — Z87891 Personal history of nicotine dependence: Secondary | ICD-10-CM | POA: Insufficient documentation

## 2019-04-23 MED ORDER — CEPHALEXIN 500 MG PO CAPS: 500.0000 mg | ORAL_CAPSULE | Freq: Once | ORAL | Status: DC

## 2019-04-23 MED ORDER — CEPHALEXIN 500 MG PO CAPS: 500.0000 mg | ORAL_CAPSULE | Freq: Three times a day (TID) | ORAL | 0 refills | Status: AC

## 2019-04-23 NOTE — ED Provider Notes (Signed)
[facility] EMERGENCY DEPARTMENT Provider Note   CSN: [id number] Arrival date & time: 04/23/19  2128     History Chief Complaint  Patient presents with  . Hand Pain    Bobby Bridges is a 44 y.o. male.  HPI    This patient is a 44 year old male, he reports no chronic medical problems in fact he states that other than sleep apnea he has never been told that he had hypertension.  The patient states that he has had left middle finger pain for the last 2 days.  He has noticed that today it became significantly swollen around the edges of the nail.  He has not had any fevers or spreading redness.  He has not done anything for this at all including taking no medications and no warm soaks.  He denies having anything like this in the past, denies any injuries to the finger.  The patient also complains of not being able to sleep well at night which is a chronic problem  Past Medical History:  Diagnosis Date  . Anxiety   . Asthma   . Chronic back pain   . Depression   . GERD (gastroesophageal reflux disease)   . Hearing loss in left ear   . Hypertension   . Seizures ([place])    outgrew by age 71  . Sleep apnea sleep apnea    Patient Active Problem List   Diagnosis Date Noted  . Elevated troponin 09/22/2014  . Nausea 09/22/2014  . Tick bite 09/22/2014  . Major depression 02/04/2012  . Major depressive disorder, recurrent episode ([place]) 12/02/2011    Class: Acute  . REDNESS OR DISCHARGE OF EYE 02/03/2008  . ALOPECIA 02/03/2008  . HYPERLIPIDEMIA 06/27/2006  . ALLERGIC RHINITIS, SEASONAL 06/27/2006  . SLEEP APNEA 06/27/2006  . HYPERGLYCEMIA 06/27/2006  . OBESITY NOS 04/23/2006  . DEPRESSION 04/23/2006  . HYPERTENSION 04/23/2006  . ALLERGIC RHINITIS 04/23/2006  . BRONCHITIS NOS 04/23/2006  . ASTHMA 04/23/2006  . GERD 04/23/2006  . SEIZURE DISORDER 04/23/2006    History reviewed. No pertinent surgical history.     Family History  Problem Relation Age of Onset  .  Cancer Mother   . Hypertension Mother   . Cancer Father   . Hypertension Father   . Heart disease Other        "before 44 years old"    Social History   Tobacco Use  . Smoking status: Former Smoker    Packs/day: 1.00    Years: 2.00    Pack years: 2.00    Types: Cigarettes    Quit date: 04/08/2012    Years since quitting: 7.0  . Smokeless tobacco: Never Used  Substance Use Topics  . Alcohol use: No  . Drug use: No    Home Medications Prior to Admission medications   Medication Sig Start Date End Date Taking? Authorizing Provider  acetaminophen (TYLENOL) 500 MG tablet Take 500 mg by mouth every 6 (six) hours as needed for mild pain or moderate pain.    [provider]  cephALEXin (KEFLEX) 500 MG capsule Take 1 capsule (500 mg total) by mouth 3 (three) times daily for 7 days. 04/23/19 04/30/19  Noemi Chapel, MD  diazepam (VALIUM) 5 MG tablet Take 1 tablet (5 mg total) by mouth 2 (two) times daily. 02/04/18   Isla Pence, MD  hydrochlorothiazide (HYDRODIURIL) 25 MG tablet Take 1 tablet (25 mg total) by mouth daily. 01/26/19   Evalee Jefferson, PA-C  HYDROcodone-acetaminophen (NORCO/VICODIN) 5-325 MG  tablet Take 1 tablet by mouth every 4 (four) hours as needed. 02/04/18   Jacalyn Lefevre, MD  ondansetron (ZOFRAN-ODT) 4 MG disintegrating tablet Take 1 tablet (4 mg total) by mouth every 8 (eight) hours as needed for nausea or vomiting. 05/09/18   Benjiman Core, MD  oseltamivir (TAMIFLU) 75 MG capsule Take 1 capsule (75 mg total) by mouth every 12 (twelve) hours. 05/09/18   Benjiman Core, MD  oxyCODONE-acetaminophen (PERCOCET/ROXICET) 5-325 MG tablet Take 1 tablet by mouth every 4 (four) hours as needed for severe pain. 02/07/18   Jacalyn Lefevre, MD  sildenafil (VIAGRA) 50 MG tablet TAKE 1 TABLET BY MOUTH ONCE DAILY. TAKE 1 TABLET BY MOUTH 30 MINUTES PRIOR TO COITUS. 12/24/18   [provider]  traZODone (DESYREL) 100 MG tablet TAKE 1 TABLET BY MOUTH AT BEDTIME FOR  SLEEP 12/24/18   [provider]  zolpidem (AMBIEN) 5 MG tablet Take 1 tablet (5 mg total) by mouth at bedtime as needed for sleep. 10/19/17   Donnetta Hutching, MD    Allergies    Patient has no known allergies.  Review of Systems   Review of Systems  Constitutional: Negative for fever.  Musculoskeletal: Positive for joint swelling. Negative for back pain and myalgias.  Neurological: Negative for weakness and numbness.    Physical Exam Updated Vital Signs BP (!) 152/106 (BP Location: Right Arm)   Pulse (!) 103   Temp (!) 97.5 F (36.4 C) (Oral)   Resp (!) 22   Ht 1.854 m (6\' 1" )   Wt (!) 187.3 kg   SpO2 100%   BMI 54.49 kg/m   Physical Exam Vitals and nursing note reviewed.  Constitutional:      Appearance: He is well-developed. He is not diaphoretic.  HENT:     Head: Normocephalic and atraumatic.  Eyes:     General:        Right eye: No discharge.        Left eye: No discharge.     Conjunctiva/sclera: Conjunctivae normal.  Pulmonary:     Effort: Pulmonary effort is normal. No respiratory distress.  Musculoskeletal:     Comments: Bilateral upper extremities with total normal range of motion.  His left third finger has pain and swelling with soft fluctuant skin around the edges of the nailbed extending onto the pad of the finger but there is no tenseness to this, there is no signs of a felon.  Skin:    General: Skin is warm and dry.     Findings: No erythema or rash.     Comments: Minimal redness of the tip of the left third finger.  Neurological:     Mental Status: He is alert.     Coordination: Coordination normal.     ED Results / Procedures / Treatments   Labs (all labs ordered are listed, but only abnormal results are displayed) Labs Reviewed - No data to display  EKG None  Radiology No results found.  Procedures Procedures (including critical care time)  Medications Ordered in ED Medications  cephALEXin (KEFLEX) capsule 500 mg (has no  administration in time range)    ED Course  I have reviewed the triage vital signs and the nursing notes.  Pertinent labs & imaging results that were available during my care of the patient were reviewed by me and considered in my medical decision making (see chart for details).    MDM Rules/Calculators/A&P  Paronychia present on the left third finger.  The patient is afebrile, he is not tachycardic on my exam, he is well-appearing and can go home with warm soaks and antibiotics, he is agreeable and understands indications for return.  Also recommended a combination of melatonin and Advil p.m. for his sleep.  He is agreeable to this as well.  Stable for discharge  Final Clinical Impression(s) / ED Diagnoses Final diagnoses:  Paronychia, finger, left  Essential hypertension    Rx / DC Orders ED Discharge Orders         Ordered    cephALEXin (KEFLEX) 500 MG capsule  3 times daily     04/23/19 2153           Eber Hong, MD 04/23/19 2156

## 2019-04-23 NOTE — Discharge Instructions (Signed)
You have an infection in your finger which is likely related to a staph infection.  This occurs when you get a bacteria that crawls underneath your finger skin on top of the nail and causes a pus pocket.  What you will need to do is to do warm soaks 3 times a day, let your hand soak in hot soapy water, not so hot that you burn your skin but hot enough that it helps to open up the drainage and let it out.  Also, take Keflex, 3 times daily for 7 days.  You may try for your sleep a combination of melatonin and Advil PM if you are having difficulty sleeping.  Seek medical exam within 2 or 3 days if no better.  [facility] Primary Care Doctor List    Kari Baars MD. Specialty: Pulmonary Disease Contact information: [address]  [address]  [place] [address]  [phone]   Syliva Overman, MD. Specialty: [facility] Medicine Contact information: [address]  [place] [address]  [phone]   Lilyan Punt, MD. Specialty: [facility] Medicine Contact information: [address]  [place] [address]  [phone]   Avon Gully, MD Specialty: Internal Medicine Contact information: [address] [place] [address]  [phone]   Catalina Pizza, MD. Specialty: Internal Medicine Contact information: [address]  [place] [address]  [phone]    [facility] (Dr. Selena Batten) Specialty: Family Medicine Contact information: [address]  [place] [address]  [phone]   John Giovanni, MD. Specialty: [facility] Medicine Contact information: [address]  [address]  [place] [address]  [phone]   Carylon Perches, MD. Specialty: Internal Medicine Contact information: [address]  [address]  [place] [address]  [phone]    [facility]  [address] [place], [place] [address] [phone]  Services [facility] offers a variety  of basic health services.  Services include but are not limited to: Blood pressure checks  Heart rate checks  Blood sugar checks  Urine analysis  Rapid strep tests  Pregnancy tests.  Health education and referrals  People needing more complex services will be directed to a physician online. Using these virtual visits, doctors can evaluate and prescribe medicine and treatments. There will be no medication on-site, though [facility] will help patients fill their prescriptions at little to no cost.   For More information please go to: [email or web address]

## 2019-04-23 NOTE — ED Triage Notes (Signed)
Pt with swelling and pain around left middle finger nail.  Unable to sleep for past few days as well.

## 2019-06-26 ENCOUNTER — Other Ambulatory Visit: Payer: Self-pay

## 2019-06-26 ENCOUNTER — Emergency Department (HOSPITAL_COMMUNITY)
Admission: EM | Admit: 2019-06-26 | Discharge: 2019-06-27 | Disposition: A | Discharge status: Home / Self Care | Payer: Self-pay | Attending: Emergency Medicine | Admitting: Emergency Medicine

## 2019-06-26 ENCOUNTER — Encounter (HOSPITAL_COMMUNITY): Payer: Self-pay

## 2019-06-26 DIAGNOSIS — Z87891 Personal history of nicotine dependence: Secondary | ICD-10-CM | POA: Insufficient documentation

## 2019-06-26 DIAGNOSIS — R42 Dizziness and giddiness: Secondary | ICD-10-CM | POA: Insufficient documentation

## 2019-06-26 DIAGNOSIS — I10 Essential (primary) hypertension: Secondary | ICD-10-CM | POA: Insufficient documentation

## 2019-06-26 DIAGNOSIS — Z79899 Other long term (current) drug therapy: Secondary | ICD-10-CM | POA: Insufficient documentation

## 2019-06-26 DIAGNOSIS — J45909 Unspecified asthma, uncomplicated: Secondary | ICD-10-CM | POA: Insufficient documentation

## 2019-06-26 DIAGNOSIS — R0600 Dyspnea, unspecified: Secondary | ICD-10-CM | POA: Insufficient documentation

## 2019-06-26 NOTE — ED Triage Notes (Signed)
Pt states he feels "light on his feet" but is reluctant to call it dizziness.  Pt denies pain.  Pt also c/o swelling to both ankles x 1 year.

## 2019-06-27 ENCOUNTER — Emergency Department (HOSPITAL_COMMUNITY): Payer: Self-pay

## 2019-06-27 LAB — BASIC METABOLIC PANEL
Anion gap: 7 (ref 5–15)
BUN: 17 mg/dL (ref 6–20)
CO2: 25 mmol/L (ref 22–32)
Calcium: 8.7 mg/dL — ABNORMAL LOW (ref 8.9–10.3)
Chloride: 105 mmol/L (ref 98–111)
Creatinine, Ser: 1.23 mg/dL (ref 0.61–1.24)
GFR calc Af Amer: >60 mL/min (ref >60)
GFR calc non Af Amer: >60 mL/min (ref >60)
Glucose, Bld: 129 mg/dL — ABNORMAL HIGH (ref 70–99)
Potassium: 3.6 mmol/L (ref 3.5–5.1)
Sodium: 137 mmol/L (ref 135–145)

## 2019-06-27 LAB — CBC WITH DIFFERENTIAL/PLATELET
Abs Immature Granulocytes: 0.03 10*3/uL (ref 0.00–0.07)
Basophils Absolute: 0 10*3/uL (ref 0.0–0.1)
Basophils Relative: 1 %
Eosinophils Absolute: 0.1 10*3/uL (ref 0.0–0.5)
Eosinophils Relative: 1 %
HCT: 44.7 % (ref 39.0–52.0)
Hemoglobin: 14 g/dL (ref 13.0–17.0)
Immature Granulocytes: 0 %
Lymphocytes Relative: 45 %
Lymphs Abs: 3.8 10*3/uL (ref 0.7–4.0)
MCH: 26.5 pg (ref 26.0–34.0)
MCHC: 31.3 g/dL (ref 30.0–36.0)
MCV: 84.7 fL (ref 80.0–100.0)
Monocytes Absolute: 0.8 10*3/uL (ref 0.1–1.0)
Monocytes Relative: 9 %
Neutro Abs: 3.7 10*3/uL (ref 1.7–7.7)
Neutrophils Relative %: 44 %
Platelets: 285 10*3/uL (ref 150–400)
RBC: 5.28 MIL/uL (ref 4.22–5.81)
RDW: 13.9 % (ref 11.5–15.5)
WBC: 8.4 10*3/uL (ref 4.0–10.5)
nRBC: 0 % (ref 0.0–0.2)

## 2019-06-27 LAB — TROPONIN I (HIGH SENSITIVITY)
Troponin I (High Sensitivity): 4 ng/L (ref <18)
Troponin I (High Sensitivity): 5 ng/L (ref <18)

## 2019-06-27 LAB — D-DIMER, QUANTITATIVE (NOT AT ARMC): D-Dimer, Quant: 0.74 ug/mL-FEU — ABNORMAL HIGH (ref 0.00–0.50)

## 2019-06-27 MED ORDER — IOHEXOL 350 MG/ML SOLN
100.0000 mL | Freq: Once | INTRAVENOUS | Status: AC | PRN
  Administered 2019-06-27: 100 mL via INTRAVENOUS

## 2019-06-27 NOTE — Discharge Instructions (Addendum)
Your testing does not show any evidence of a heart attack or blood clot in the lung.  Your blood pressure is elevated and you should follow-up with a primary doctor regarding this.  You also need to have a sleep study to assess for your risk of sleep apnea.  This may be contributing to your sleeping problems. You may take a combination of melatonin and Advil PM at night to assist with your sleeping. Return to the ED with difficulty breathing, chest pain, unilateral weakness, numbness, tingling, difficulty speaking or difficulty swallowing or any concerns.

## 2019-06-27 NOTE — ED Provider Notes (Signed)
[facility] EMERGENCY DEPARTMENT Provider Note   CSN: [id number] Arrival date & time: 06/26/19  2332     History Chief Complaint  Patient presents with  . Dizziness    Bobby Bridges is a 44 y.o. male.  Patient with history of obesity, sleep apnea, hypertension here with "feeling uncoordinated and off balance".  Also feels "light on my feet".  States he feels something is not quite right with his breathing as well but does not feel short of breath.  Symptoms started around 8 PM while he was at a friend's house.  Denies any drug use or exposures.  States he does not feel short of breath but feels like he cannot get a good breath in.  He feels off balance and feels like something is uncoordinated.  He denies any focal weakness, numbness or tingling.  Denies any difficulty speaking or difficulty swallowing.  Denies any blurred vision or double vision.  Denies any room spinning dizziness.  Denies any headache, visual change, chest pain.  Does not feel short of breath but feels as "breathing is not quite right".  States he does not take any regular medications.  Denies any alcohol or drug use tonight. States he has not treated for his hypertension or his sleep apnea.  He has never had a sleep study.  The history is provided by the patient.  Dizziness Associated symptoms: weakness   Associated symptoms: no headaches, no nausea, no shortness of breath and no vomiting        Past Medical History:  Diagnosis Date  . Anxiety   . Asthma   . Chronic back pain   . Depression   . GERD (gastroesophageal reflux disease)   . Hearing loss in left ear   . Hypertension   . Seizures ([facility])    outgrew by age 54  . Sleep apnea sleep apnea    Patient Active Problem List   Diagnosis Date Noted  . Elevated troponin 09/22/2014  . Nausea 09/22/2014  . Tick bite 09/22/2014  . Major depression 02/04/2012  . Major depressive disorder, recurrent episode ([facility]) 12/02/2011    Class: Acute  .  REDNESS OR DISCHARGE OF EYE 02/03/2008  . ALOPECIA 02/03/2008  . HYPERLIPIDEMIA 06/27/2006  . ALLERGIC RHINITIS, SEASONAL 06/27/2006  . SLEEP APNEA 06/27/2006  . HYPERGLYCEMIA 06/27/2006  . OBESITY NOS 04/23/2006  . DEPRESSION 04/23/2006  . HYPERTENSION 04/23/2006  . ALLERGIC RHINITIS 04/23/2006  . BRONCHITIS NOS 04/23/2006  . ASTHMA 04/23/2006  . GERD 04/23/2006  . SEIZURE DISORDER 04/23/2006    History reviewed. No pertinent surgical history.     Family History  Problem Relation Age of Onset  . Cancer Mother   . Hypertension Mother   . Cancer Father   . Hypertension Father   . Heart disease Other        "before 44 years old"    Social History   Tobacco Use  . Smoking status: Former Smoker    Packs/day: 1.00    Years: 2.00    Pack years: 2.00    Types: Cigarettes    Quit date: 04/08/2012    Years since quitting: 7.2  . Smokeless tobacco: Never Used  Substance Use Topics  . Alcohol use: No  . Drug use: No    Home Medications Prior to Admission medications   Medication Sig Start Date End Date Taking? Authorizing Provider  acetaminophen (TYLENOL) 500 MG tablet Take 500 mg by mouth every 6 (six) hours as needed for mild  pain or moderate pain.    [provider]  diazepam (VALIUM) 5 MG tablet Take 1 tablet (5 mg total) by mouth 2 (two) times daily. 02/04/18   Isla Pence, MD  hydrochlorothiazide (HYDRODIURIL) 25 MG tablet Take 1 tablet (25 mg total) by mouth daily. 01/26/19   Evalee Jefferson, PA-C  HYDROcodone-acetaminophen (NORCO/VICODIN) 5-325 MG tablet Take 1 tablet by mouth every 4 (four) hours as needed. 02/04/18   Isla Pence, MD  ondansetron (ZOFRAN-ODT) 4 MG disintegrating tablet Take 1 tablet (4 mg total) by mouth every 8 (eight) hours as needed for nausea or vomiting. 05/09/18   Davonna Belling, MD  oseltamivir (TAMIFLU) 75 MG capsule Take 1 capsule (75 mg total) by mouth every 12 (twelve) hours. 05/09/18   Davonna Belling, MD    oxyCODONE-acetaminophen (PERCOCET/ROXICET) 5-325 MG tablet Take 1 tablet by mouth every 4 (four) hours as needed for severe pain. 02/07/18   Isla Pence, MD  sildenafil (VIAGRA) 50 MG tablet TAKE 1 TABLET BY MOUTH ONCE DAILY. TAKE 1 TABLET BY MOUTH 30 MINUTES PRIOR TO COITUS. 12/24/18   [provider]  traZODone (DESYREL) 100 MG tablet TAKE 1 TABLET BY MOUTH AT BEDTIME FOR SLEEP 12/24/18   [provider]  zolpidem (AMBIEN) 5 MG tablet Take 1 tablet (5 mg total) by mouth at bedtime as needed for sleep. 10/19/17   Nat Christen, MD    Allergies    Patient has no known allergies.  Review of Systems   Review of Systems  Constitutional: Negative for activity change, appetite change, fatigue and fever.  HENT: Negative for congestion and rhinorrhea.   Eyes: Negative for photophobia and visual disturbance.  Respiratory: Negative for cough, chest tightness and shortness of breath.   Cardiovascular: Positive for leg swelling.  Gastrointestinal: Negative for abdominal pain, nausea and vomiting.  Genitourinary: Negative for dysuria and hematuria.  Neurological: Positive for dizziness, weakness and light-headedness. Negative for syncope, speech difficulty, numbness and headaches.   all other systems are negative except as noted in the HPI and PMH.    Physical Exam Updated Vital Signs BP 131/74 (BP Location: Left Arm)   Pulse (!) 110   Temp 98.1 F (36.7 C) (Oral)   Resp 20   Ht 6\' 1"  (1.854 m)   Wt (!) 190.1 kg   SpO2 100%   BMI 55.28 kg/m   Physical Exam Vitals and nursing note reviewed.  Constitutional:      General: He is not in acute distress.    Appearance: He is well-developed. He is obese.  HENT:     Head: Normocephalic and atraumatic.     Mouth/Throat:     Pharynx: No oropharyngeal exudate.  Eyes:     Conjunctiva/sclera: Conjunctivae normal.     Pupils: Pupils are equal, round, and reactive to light.     Comments: R eye strabismus  Neck:     Comments:  No meningismus. Cardiovascular:     Rate and Rhythm: Regular rhythm. Tachycardia present.     Heart sounds: Normal heart sounds. No murmur.  Pulmonary:     Effort: Pulmonary effort is normal. No respiratory distress.     Breath sounds: Normal breath sounds.  Abdominal:     Palpations: Abdomen is soft.     Tenderness: There is no abdominal tenderness. There is no guarding or rebound.  Musculoskeletal:        General: No tenderness. Normal range of motion.     Cervical back: Normal range of motion and neck supple.  Skin:    General: Skin is warm.  Neurological:     General: No focal deficit present.     Mental Status: He is alert and oriented to person, place, and time.     Cranial Nerves: No cranial nerve deficit.     Motor: No abnormal muscle tone.     Coordination: Coordination normal.     Comments: CN 2-12 intact, no ataxia on finger to nose, no nystagmus, 5/5 strength throughout, no pronator drift, Romberg negative, normal gait.  No nystagmus.  Right eye strabismus. No ataxia on finger-to-nose  Psychiatric:        Behavior: Behavior normal.     ED Results / Procedures / Treatments   Labs (all labs ordered are listed, but only abnormal results are displayed) Labs Reviewed  BASIC METABOLIC PANEL - Abnormal; Notable for the following components:      Result Value   Glucose, Bld 129 (*)    Calcium 8.7 (*)    All other components within normal limits  D-DIMER, QUANTITATIVE (NOT AT [facility]) - Abnormal; Notable for the following components:   D-Dimer, Quant 0.74 (*)    All other components within normal limits  CBC WITH DIFFERENTIAL/PLATELET  TROPONIN I (HIGH SENSITIVITY)  TROPONIN I (HIGH SENSITIVITY)    EKG EKG Interpretation  Date/Time:  Saturday June 27 2019 00:07:11 EDT Ventricular Rate:  97 PR Interval:    QRS Duration: 107 QT Interval:  356 QTC Calculation: 453 R Axis:   116 Text Interpretation: Sinus rhythm Left atrial enlargement Right axis deviation  Baseline wander in lead(s) V2 No significant change was found Confirmed by Glynn Octave [phone]) on 06/27/2019 12:13:24 AM   Radiology CT Head Wo Contrast  Result Date: 06/27/2019 CLINICAL DATA:  Dizziness EXAM: CT HEAD WITHOUT CONTRAST TECHNIQUE: Contiguous axial images were obtained from the base of the skull through the vertex without intravenous contrast. COMPARISON:  10/07/2014 FINDINGS: Brain: No acute intracranial abnormality. Specifically, no hemorrhage, hydrocephalus, mass lesion, acute infarction, or significant intracranial injury. Vascular: No hyperdense vessel or unexpected calcification. Skull: No acute calvarial abnormality. Sinuses/Orbits: Visualized paranasal sinuses and mastoids clear. Orbital soft tissues unremarkable. Other: None IMPRESSION: Normal study. Electronically Signed   By: Charlett Nose M.D.   On: 06/27/2019 01:53   CT Angio Chest PE W and/or Wo Contrast  Result Date: 06/27/2019 CLINICAL DATA:  Dizziness with shortness of breath, positive D-dimer EXAM: CT ANGIOGRAPHY CHEST WITH CONTRAST TECHNIQUE: Multidetector CT imaging of the chest was performed using the standard protocol during bolus administration of intravenous contrast. Multiplanar CT image reconstructions and MIPs were obtained to evaluate the vascular anatomy. CONTRAST:  OMNIPAQUE IOHEXOL 350 MG/ML SOLN COMPARISON:  CT 01/15/2012, radiograph 01/26/2019 FINDINGS: Cardiovascular: Evaluation the pulmonary arteries is significantly limited due to borderline opacification of the central pulmonary arteries and extensive respiratory motion artifact. No large central or lobar filling defects are seen. Smaller nonocclusive or more distal filling defects may be below the level of detection on this exam. Central pulmonary arteries are normal caliber. Mild cardiomegaly is similar to priors with predominantly right heart enlargement. No pericardial effusion. The aortic root is suboptimally assessed given cardiac pulsation  artifact. The aorta is normal caliber. No intramural hematoma, dissection flap or other acute luminal abnormality of the aorta is seen. No periaortic stranding or hemorrhage. Shared origin of the brachiocephalic and left common carotid artery. Major venous structures are unremarkable. Mediastinum/Nodes: No mediastinal fluid or gas. Normal thyroid gland and thoracic inlet. No acute abnormality of the  trachea or esophagus. No worrisome mediastinal, hilar or axillary adenopathy. Lungs/Pleura: Mild hypoventilatory changes in the lungs likely accentuated by imaging during exhalation for the angiographic technique. No consolidation, features of edema, pneumothorax, or effusion. No visible pulmonary nodules or masses within the limitations of detection given extensive respiratory motion artifact. Upper Abdomen: No acute abnormalities present in the visualized portions of the upper abdomen. Musculoskeletal: Multilevel degenerative changes are present in the imaged portions of the spine. Marked lower thoracic dextrocurvature with compensatory upper thoracic levocurvature. No acute or suspicious osseous lesions. No worrisome chest wall lesions are visualized. Review of the MIP images confirms the above findings. IMPRESSION: 1. Evaluation the pulmonary arteries is significantly limited due to borderline opacification of the central pulmonary arteries and extensive respiratory motion artifact. No large central or lobar filling defects are seen. Smaller nonocclusive or more distal filling defects may be below the level of detection on this exam. 2. No acute intrathoracic abnormality identified. Electronically Signed   By: Kreg Shropshire M.D.   On: 06/27/2019 01:56    Procedures Procedures (including critical care time)  Medications Ordered in ED Medications - No data to display  ED Course  I have reviewed the triage vital signs and the nursing notes.  Pertinent labs & imaging results that were available during my care  of the patient were reviewed by me and considered in my medical decision making (see chart for details).    MDM Rules/Calculators/A&P                     Patient with poorly described sensation of feeling off balance and lightheaded but denies room spinning dizziness.  No focal neurological deficits.  No ataxia or nystagmus.  Low suspicion for acute CVA.  Will obtain EKG, labs, orthostatic vitals  Orthostatics negative. Hypertensive. Does not take meds at home. Hemoglobin stable.  Reassuring.  Troponin negative.  D-dimer mildly elevated.  CT scan is negative for acute pathology.  CTPA study is limited but negative for large PE.  Patient feels improved.  Tolerating p.o. and ambulatory.  No focal neurological deficits. Troponin Negative x2.  Low suspicion for ACS.  Patient states he is feeling improved on recheck.  Denies any dizziness or lightheadedness.  Denies any room spinning dizziness.  He is able to tolerate p.o. and ambulate without difficulty.  Low suspicion for CVA or TIA. Unclear etiology of his symptoms.  Discussed anxiety may be contributing.  Patient states he has insomnia problems and feels like it could be contributing.  Discussed he likely has sleep apnea and should have a sleep study.    Needs to establish care with PCP. Recommend outpatient sleep study.  Informed of elevated blood pressure in the ED and need for followup. Return precautions discussed.   Final Clinical Impression(s) / ED Diagnoses Final diagnoses:  Lightheadedness    Rx / DC Orders ED Discharge Orders    None       Janal Haak, Jeannett Senior, MD 06/27/19 [phone]

## 2019-06-27 NOTE — ED Notes (Signed)
Pt tolerated PO challenge well.

## 2019-06-27 NOTE — ED Notes (Signed)
Pt ambulated to restroom on his own and back to room

## 2019-06-27 NOTE — ED Notes (Signed)
Pt giving something to drink for fluid challenge.

## 2019-09-22 ENCOUNTER — Emergency Department (HOSPITAL_COMMUNITY)
Admission: EM | Admit: 2019-09-22 | Discharge: 2019-09-22 | Disposition: A | Discharge status: Home / Self Care | Payer: Self-pay | Attending: Emergency Medicine | Admitting: Emergency Medicine

## 2019-09-22 ENCOUNTER — Other Ambulatory Visit: Payer: Self-pay

## 2019-09-22 ENCOUNTER — Encounter (HOSPITAL_COMMUNITY): Payer: Self-pay | Admitting: Emergency Medicine

## 2019-09-22 DIAGNOSIS — R2243 Localized swelling, mass and lump, lower limb, bilateral: Secondary | ICD-10-CM | POA: Insufficient documentation

## 2019-09-22 DIAGNOSIS — Z5321 Procedure and treatment not carried out due to patient leaving prior to being seen by health care provider: Secondary | ICD-10-CM | POA: Insufficient documentation

## 2019-09-22 NOTE — ED Triage Notes (Signed)
Patient complains of bilateral ankle swelling for the past several months. Patient does not have a PCP.

## 2020-01-31 ENCOUNTER — Other Ambulatory Visit: Payer: Self-pay

## 2020-01-31 ENCOUNTER — Encounter (HOSPITAL_COMMUNITY): Payer: Self-pay | Admitting: Emergency Medicine

## 2020-01-31 ENCOUNTER — Emergency Department (HOSPITAL_COMMUNITY)
Admission: EM | Admit: 2020-01-31 | Discharge: 2020-01-31 | Disposition: A | Discharge status: Home / Self Care | Payer: Self-pay | Attending: Emergency Medicine | Admitting: Emergency Medicine

## 2020-01-31 ENCOUNTER — Emergency Department (HOSPITAL_COMMUNITY): Payer: Self-pay

## 2020-01-31 DIAGNOSIS — I10 Essential (primary) hypertension: Secondary | ICD-10-CM | POA: Insufficient documentation

## 2020-01-31 DIAGNOSIS — L03011 Cellulitis of right finger: Secondary | ICD-10-CM | POA: Insufficient documentation

## 2020-01-31 DIAGNOSIS — Z87891 Personal history of nicotine dependence: Secondary | ICD-10-CM | POA: Insufficient documentation

## 2020-01-31 DIAGNOSIS — Z79899 Other long term (current) drug therapy: Secondary | ICD-10-CM | POA: Insufficient documentation

## 2020-01-31 DIAGNOSIS — Z23 Encounter for immunization: Secondary | ICD-10-CM | POA: Insufficient documentation

## 2020-01-31 DIAGNOSIS — J45909 Unspecified asthma, uncomplicated: Secondary | ICD-10-CM | POA: Insufficient documentation

## 2020-01-31 MED ORDER — TETANUS-DIPHTH-ACELL PERTUSSIS 5-2.5-18.5 LF-MCG/0.5 IM SUSP
0.5000 mL | Freq: Once | INTRAMUSCULAR | Status: AC
  Administered 2020-01-31: 0.5 mL via INTRAMUSCULAR
  Filled 2020-01-31: qty 0.5

## 2020-01-31 MED ORDER — HYDROCODONE-ACETAMINOPHEN 5-325 MG PO TABS: 1.0000 | ORAL_TABLET | Freq: Four times a day (QID) | ORAL | 0 refills | Status: DC | PRN

## 2020-01-31 MED ORDER — HYDROCODONE-ACETAMINOPHEN 5-325 MG PO TABS
1.0000 | ORAL_TABLET | Freq: Once | ORAL | Status: AC
  Administered 2020-01-31: 1 via ORAL
  Filled 2020-01-31: qty 1

## 2020-01-31 MED ORDER — PENTAFLUOROPROP-TETRAFLUOROETH EX AERO: INHALATION_SPRAY | CUTANEOUS | Status: DC | PRN

## 2020-01-31 MED ORDER — DOXYCYCLINE HYCLATE 100 MG PO TABS
100.0000 mg | ORAL_TABLET | Freq: Once | ORAL | Status: AC
  Administered 2020-01-31: 100 mg via ORAL
  Filled 2020-01-31: qty 1

## 2020-01-31 MED ORDER — DOXYCYCLINE HYCLATE 100 MG PO CAPS: 100.0000 mg | ORAL_CAPSULE | Freq: Two times a day (BID) | ORAL | 0 refills | Status: DC

## 2020-01-31 NOTE — Discharge Instructions (Signed)
Please take Ibuprofen (Advil, motrin) and Tylenol (acetaminophen) to relieve your pain.  You may take up to 600 MG (3 pills) of normal strength ibuprofen every 8 hours as needed.  In between doses of ibuprofen you make take tylenol, up to 1,000 mg (two extra strength pills).  Do not take more than 3,000 mg tylenol in a 24 hour period.  Please check all medication labels as many medications such as pain and cold medications may contain tylenol.  Do not drink alcohol while taking these medications.  Do not take other NSAID'S while taking ibuprofen (such as aleve or naproxen).  Please take ibuprofen with food to decrease stomach upset. ° °Today you received medications that may make you sleepy or impair your ability to make decisions.  For the next 24 hours please do not drive, operate heavy machinery, care for a small child with out another adult present, or perform any activities that may cause harm to you or someone else if you were to fall asleep or be impaired.  ° °You are being prescribed a medication which may make you sleepy. Please follow up of listed precautions for at least 24 hours after taking one dose. ° °You may have diarrhea from the antibiotics.  It is very important that you continue to take the antibiotics even if you get diarrhea unless a medical professional tells you that you may stop taking them.  If you stop too early the bacteria you are being treated for will become stronger and you may need different, more powerful antibiotics that have more side effects and worsening diarrhea.  Please stay well hydrated and consider probiotics as they may decrease the severity of your diarrhea.   °

## 2020-01-31 NOTE — ED Provider Notes (Signed)
[facility] EMERGENCY DEPARTMENT Provider Note   CSN: [id number] Arrival date & time: 01/31/20  1937     History Chief Complaint  Patient presents with  . Hand Pain    right ring finger    Bobby Bridges is a 44 y.o. male with a past medical history of obesity, hypertension, who presents today for evaluation of pain in his right ring finger.  The pain started about 3 days ago.  He has noted swelling through the right ring finger.  He states that otherwise he feels fine, denies any fevers, myalgias/arthralgias or chills.  He denies nausea vomiting or diarrhea  He reports that a few days before the pulling and pain started he had cut the nails on his finger. He denies biting on or chewing his nails or cuticles. No recent manicures.  HPI     Past Medical History:  Diagnosis Date  . Anxiety   . Asthma   . Chronic back pain   . Depression   . GERD (gastroesophageal reflux disease)   . Hearing loss in left ear   . Hypertension   . Seizures (HCC)    outgrew by age 75  . Sleep apnea sleep apnea    Patient Active Problem List   Diagnosis Date Noted  . Elevated troponin 09/22/2014  . Nausea 09/22/2014  . Tick bite 09/22/2014  . Major depression 02/04/2012  . Major depressive disorder, recurrent episode (HCC) 12/02/2011    Class: Acute  . REDNESS OR DISCHARGE OF EYE 02/03/2008  . ALOPECIA 02/03/2008  . HYPERLIPIDEMIA 06/27/2006  . ALLERGIC RHINITIS, SEASONAL 06/27/2006  . SLEEP APNEA 06/27/2006  . HYPERGLYCEMIA 06/27/2006  . OBESITY NOS 04/23/2006  . DEPRESSION 04/23/2006  . HYPERTENSION 04/23/2006  . ALLERGIC RHINITIS 04/23/2006  . BRONCHITIS NOS 04/23/2006  . ASTHMA 04/23/2006  . GERD 04/23/2006  . SEIZURE DISORDER 04/23/2006    History reviewed. No pertinent surgical history.     Family History  Problem Relation Age of Onset  . Cancer Mother   . Hypertension Mother   . Cancer Father   . Hypertension Father   . Heart disease Other        "before  43 years old"    Social History   Tobacco Use  . Smoking status: Former Smoker    Packs/day: 1.00    Years: 2.00    Pack years: 2.00    Types: Cigarettes    Quit date: 04/08/2012    Years since quitting: 7.8  . Smokeless tobacco: Never Used  Vaping Use  . Vaping Use: Never used  Substance Use Topics  . Alcohol use: No  . Drug use: No    Home Medications Prior to Admission medications   Medication Sig Start Date End Date Taking? Authorizing Provider  acetaminophen (TYLENOL) 500 MG tablet Take 500 mg by mouth every 6 (six) hours as needed for mild pain or moderate pain.    [provider]  diazepam (VALIUM) 5 MG tablet Take 1 tablet (5 mg total) by mouth 2 (two) times daily. 02/04/18   Jacalyn Lefevre, MD  doxycycline (VIBRAMYCIN) 100 MG capsule Take 1 capsule (100 mg total) by mouth 2 (two) times daily. 01/31/20   Cristina Gong, PA-C  hydrochlorothiazide (HYDRODIURIL) 25 MG tablet Take 1 tablet (25 mg total) by mouth daily. 01/26/19   Burgess Amor, PA-C  HYDROcodone-acetaminophen (NORCO/VICODIN) 5-325 MG tablet Take 1 tablet by mouth every 6 (six) hours as needed for severe pain. 01/31/20   Lyndel Safe  W, PA-C  ondansetron (ZOFRAN-ODT) 4 MG disintegrating tablet Take 1 tablet (4 mg total) by mouth every 8 (eight) hours as needed for nausea or vomiting. 05/09/18   Benjiman CorePickering, Nathan, MD  oseltamivir (TAMIFLU) 75 MG capsule Take 1 capsule (75 mg total) by mouth every 12 (twelve) hours. 05/09/18   Benjiman CorePickering, Nathan, MD  oxyCODONE-acetaminophen (PERCOCET/ROXICET) 5-325 MG tablet Take 1 tablet by mouth every 4 (four) hours as needed for severe pain. 02/07/18   Jacalyn LefevreHaviland, Julie, MD  sildenafil (VIAGRA) 50 MG tablet TAKE 1 TABLET BY MOUTH ONCE DAILY. TAKE 1 TABLET BY MOUTH 30 MINUTES PRIOR TO COITUS. 12/24/18   [provider]  traZODone (DESYREL) 100 MG tablet TAKE 1 TABLET BY MOUTH AT BEDTIME FOR SLEEP 12/24/18   [provider]  zolpidem (AMBIEN) 5 MG  tablet Take 1 tablet (5 mg total) by mouth at bedtime as needed for sleep. 10/19/17   Donnetta Hutchingook, Brian, MD    Allergies    Patient has no known allergies.  Review of Systems   Review of Systems  Constitutional: Negative for chills, fatigue and fever.  Musculoskeletal:       And swelling in right ring finger  Skin: Positive for color change.  All other systems reviewed and are negative.   Physical Exam Updated Vital Signs BP 132/89   Pulse 99   Temp 98.3 F (36.8 C)   Resp 18   Ht 6\' 1"  (1.854 m)   Wt (!) 192.7 kg   SpO2 99%   BMI 56.05 kg/m   Physical Exam Vitals and nursing note reviewed.  Constitutional:      General: He is not in acute distress. HENT:     Head: Normocephalic and atraumatic.  Cardiovascular:     Rate and Rhythm: Normal rate.  Pulmonary:     Effort: Pulmonary effort is normal. No respiratory distress.  Musculoskeletal:     Cervical back: No rigidity.     Comments: There is edema around the right ring finger. Despite edema patient is able to fully make a fist and has full active range of motion through both flexion and extension of the right ring finger.    Skin:    Comments: Please see clinical image. There is obvious paronychia around the right ring finger extending into the proximal nail fold. Mild erythema of the ring finger however does not extend into the hand.  Neurological:     Mental Status: He is alert. Mental status is at baseline.     Comments: Awake and alert, answers all questions appropriately.  Speech is not slurred.    Psychiatric:        Mood and Affect: Mood normal.     ED Results / Procedures / Treatments   Labs (all labs ordered are listed, but only abnormal results are displayed) Labs Reviewed  AEROBIC CULTURE (SUPERFICIAL SPECIMEN)    EKG None  Radiology DG Finger Ring Right  Result Date: 01/31/2020 CLINICAL DATA:  Right ring finger pain and swelling. EXAM: RIGHT RING FINGER 2+V COMPARISON:  None. FINDINGS: There is  no evidence of fracture or dislocation. There is no evidence of arthropathy or other focal bone abnormality. There is mild diffuse soft tissue swelling. No soft tissue air is seen. IMPRESSION: Mild diffuse soft tissue swelling without an acute osseous abnormality. Electronically Signed   By: Aram Candelahaddeus  Houston M.D.   On: 01/31/2020 20:27    Procedures .Marland Kitchen.Incision and Drainage  Date/Time: 01/31/2020 11:12 PM Performed by: Cristina GongHammond, Masaye Gatchalian W, PA-C Authorized  by: Cristina Gong, PA-C   Consent:    Consent obtained:  Verbal   Consent given by:  Patient   Risks discussed:  Bleeding, incomplete drainage, pain, infection and damage to other organs (Need for additional procedures or repeat I&D.)   Alternatives discussed:  No treatment, alternative treatment and referral Location:    Type:  Abscess (Paronychia abscess)   Size:  1cm   Location:  Upper extremity   Upper extremity location:  Finger   Finger location:  R ring finger Pre-procedure details:    Skin preparation:  Chloraprep and Betadine Anesthesia (see MAR for exact dosages):    Anesthesia method:  Topical application (Discussed options for digital block, EMLA, and others)   Topical anesthesia: Cold spray. Procedure type:    Complexity:  Simple Procedure details:    Incision types:  Stab incision   Incision depth:  Dermal   Scalpel blade:  11   Wound management:  Irrigated with saline   Drainage:  Purulent   Drainage amount:  Scant   Wound treatment:  Wound left open   Packing materials:  None Post-procedure details:    Patient tolerance of procedure:  Tolerated well, no immediate complications   (including critical care time)  Medications Ordered in ED Medications  pentafluoroprop-tetrafluoroeth (GEBAUERS) aerosol ( Topical Given 01/31/20 2123)  Tdap (BOOSTRIX) injection 0.5 mL (0.5 mLs Intramuscular Given 01/31/20 2122)  HYDROcodone-acetaminophen (NORCO/VICODIN) 5-325 MG per tablet 1 tablet (1 tablet Oral Given  01/31/20 2132)  doxycycline (VIBRA-TABS) tablet 100 mg (100 mg Oral Given 01/31/20 2132)    ED Course  I have reviewed the triage vital signs and the nursing notes.  Pertinent labs & imaging results that were available during my care of the patient were reviewed by me and considered in my medical decision making (see chart for details).    MDM Rules/Calculators/A&P                         Patient is a 44 year old man who presents today for evaluation of swelling and pain in the right ring finger. On exam he has a paronychia of the right ring finger. X-rays are obtained without fracture, obvious osteomyelitis or other acute abnormalities. I suspect that this is from a skin injury when patient clipped his nails as this happened 2 to 3 days prior to the onset of his symptoms of pain.   Tdap is ordered as he does not know when his last one was. I&D was performed with culture obtained. Patient is afebrile, states he feels fine otherwise not tachycardic or tachypneic, patient is not clearly septic and declined labs today. Please see procedure note for details of I&D. After we discussed options for anesthetic patient elected for cold spray only. He is given Vicodin and doxycycline while in the emergency room. He denies biting at his nails or cuticles, therefore suspect most likely causes MRSA rather than Eikenella or other oral bacterial source therefore doxycycline should be appropriate treatment.    Discussed the importance of hand follow-up. While his finger is generally swollen he does not have evidence of flexor tenosynovitis, and has full active range of motion of the affected digit. Discussed the importance of not wearing a ring jewelry on any of the fingers on his hand until this has fully resolved.  Return precautions were discussed with patient who states their understanding.  At the time of discharge patient denied any unaddressed complaints or concerns.  Patient is agreeable  for discharge  home.  Note: Portions of this report may have been transcribed using voice recognition software. Every effort was made to ensure accuracy; however, inadvertent computerized transcription errors may be present  Final Clinical Impression(s) / ED Diagnoses Final diagnoses:  Paronychia of finger, right    Rx / DC Orders ED Discharge Orders         Ordered    HYDROcodone-acetaminophen (NORCO/VICODIN) 5-325 MG tablet  Every 6 hours PRN        01/31/20 2130    doxycycline (VIBRAMYCIN) 100 MG capsule  2 times daily        01/31/20 2130           Cristina Gong, Cordelia Poche 01/31/20 2317    Jacalyn Lefevre, MD 02/01/20 [phone]

## 2020-01-31 NOTE — ED Triage Notes (Signed)
Pt c/o right ring finger pain and swelling that started a few days ago.

## 2020-02-03 LAB — AEROBIC CULTURE W GRAM STAIN (SUPERFICIAL SPECIMEN)

## 2020-02-04 ENCOUNTER — Telehealth: Payer: Self-pay | Admitting: *Deleted

## 2020-02-04 NOTE — Telephone Encounter (Signed)
Post ED Visit - Positive Culture Follow-up: Unsuccessful Patient Follow-up  Culture assessed and recommendations reviewed by:  []  , Pharm.D. []  Enzo Bi, Pharm.D., BCPS AQ-ID []  , Pharm.D., BCPS []  Celedonio Miyamoto, Pharm.D., BCPS []  [place], Garvin Fila.D., BCPS, AAHIVP []  , Pharm.D., BCPS, AAHIVP []  Georgina Pillion, PharmD []  , PharmD, BCPS [place], PharmD  Positive urine culture  []  Patient discharged without antimicrobial prescription and treatment is now indicated []  Organism is resistant to prescribed ED discharge antimicrobial []  Patient with positive blood cultures  Plan:  Symptom check, if no improvement, start Keflex 500mg  PO QID x 5 days and D/C doxycycline.  [address], PA-C  Unable to contact patient after 3 attempts, letter will be sent to address on file  02/04/2020, 8:59 AM

## 2020-02-04 NOTE — Progress Notes (Signed)
ED Antimicrobial Stewardship Positive Culture Follow Up   Bobby Bridges is an 44 y.o. male who presented to [facility] on 01/31/2020 with a chief complaint of  Chief Complaint  Patient presents with  . Hand Pain    right ring finger    Recent Results (from the past 720 hour(s))  Wound or Superficial Culture     Status: None   Collection Time: 01/31/20  9:11 PM   Specimen: Joint, Finger; Wound  Result Value Ref Range Status   Specimen Description   Final    FINGER Performed at [facility], [address], [place], [place] [address]    Special Requests   Final    NONE RIGHT Performed at [facility], [address] [place], [place] [address]    Gram Stain   Final    ABUNDANT WBC PRESENT,BOTH PMN AND MONONUCLEAR RARE GRAM POSITIVE COCCI RARE GRAM NEGATIVE RODS Performed at [facility], [address], [place], [place] [address]    Culture FEW ESCHERICHIA COLI  Final   Report Status 02/03/2020 FINAL  Final   Organism ID, Bacteria ESCHERICHIA COLI  Final      Susceptibility   Escherichia coli - MIC*    AMPICILLIN >=32 RESISTANT Resistant     CEFAZOLIN <=4 SENSITIVE Sensitive     CEFEPIME <=0.12 SENSITIVE Sensitive     CEFTAZIDIME <=1 SENSITIVE Sensitive     CEFTRIAXONE <=0.25 SENSITIVE Sensitive     CIPROFLOXACIN <=0.25 SENSITIVE Sensitive     GENTAMICIN <=1 SENSITIVE Sensitive     IMIPENEM <=0.25 SENSITIVE Sensitive     TRIMETH/SULFA >=320 RESISTANT Resistant     AMPICILLIN/SULBACTAM 16 INTERMEDIATE Intermediate     PIP/TAZO <=4 SENSITIVE Sensitive     * FEW ESCHERICHIA COLI    [x]  Treated with doxycycline, organism resistant to prescribed antimicrobial []  Patient discharged originally without antimicrobial agent and treatment is now indicated  New antibiotic prescription: Symptom check - if pt is not improving or getting worse, DC doxycycline and start cephalexin 500mg  PO 4 times daily x 5 days  ED Provider: , PA-C   Miyoshi Ligas, 02/04/2020, 8:29 AM Clinical Pharmacist Monday - Friday phone -  [phone] Saturday - Sunday phone - [phone]

## 2020-02-12 ENCOUNTER — Telehealth: Payer: Self-pay | Admitting: *Deleted

## 2020-02-12 NOTE — Telephone Encounter (Signed)
Contacted by patient in response to letter sent to address on file.  Feeling much better, asymptomatic and will not need further antibiotic therapy.

## 2020-04-18 ENCOUNTER — Other Ambulatory Visit: Payer: Self-pay

## 2020-04-18 ENCOUNTER — Emergency Department (HOSPITAL_COMMUNITY)
Admission: EM | Admit: 2020-04-18 | Discharge: 2020-04-18 | Disposition: A | Discharge status: Home / Self Care | Payer: Self-pay | Attending: Emergency Medicine | Admitting: Emergency Medicine

## 2020-04-18 ENCOUNTER — Encounter (HOSPITAL_COMMUNITY): Payer: Self-pay | Admitting: Emergency Medicine

## 2020-04-18 DIAGNOSIS — M25571 Pain in right ankle and joints of right foot: Secondary | ICD-10-CM | POA: Insufficient documentation

## 2020-04-18 DIAGNOSIS — R2242 Localized swelling, mass and lump, left lower limb: Secondary | ICD-10-CM | POA: Insufficient documentation

## 2020-04-18 DIAGNOSIS — M25572 Pain in left ankle and joints of left foot: Secondary | ICD-10-CM | POA: Insufficient documentation

## 2020-04-18 DIAGNOSIS — R2241 Localized swelling, mass and lump, right lower limb: Secondary | ICD-10-CM | POA: Insufficient documentation

## 2020-04-18 DIAGNOSIS — Z5321 Procedure and treatment not carried out due to patient leaving prior to being seen by health care provider: Secondary | ICD-10-CM | POA: Insufficient documentation

## 2020-04-18 NOTE — ED Triage Notes (Signed)
Pt c/o bilateral ankle pain with swelling x 2wks.

## 2020-06-16 ENCOUNTER — Emergency Department (HOSPITAL_COMMUNITY)
Admission: EM | Admit: 2020-06-16 | Discharge: 2020-06-16 | Disposition: A | Discharge status: Home / Self Care | Payer: Self-pay | Attending: Emergency Medicine | Admitting: Emergency Medicine

## 2020-06-16 ENCOUNTER — Emergency Department (HOSPITAL_COMMUNITY): Payer: Self-pay

## 2020-06-16 ENCOUNTER — Encounter (HOSPITAL_COMMUNITY): Payer: Self-pay | Admitting: Emergency Medicine

## 2020-06-16 ENCOUNTER — Other Ambulatory Visit: Payer: Self-pay

## 2020-06-16 DIAGNOSIS — Z79899 Other long term (current) drug therapy: Secondary | ICD-10-CM | POA: Insufficient documentation

## 2020-06-16 DIAGNOSIS — I10 Essential (primary) hypertension: Secondary | ICD-10-CM | POA: Insufficient documentation

## 2020-06-16 DIAGNOSIS — R059 Cough, unspecified: Secondary | ICD-10-CM | POA: Insufficient documentation

## 2020-06-16 DIAGNOSIS — J45909 Unspecified asthma, uncomplicated: Secondary | ICD-10-CM | POA: Insufficient documentation

## 2020-06-16 DIAGNOSIS — Z87891 Personal history of nicotine dependence: Secondary | ICD-10-CM | POA: Insufficient documentation

## 2020-06-16 DIAGNOSIS — Z20822 Contact with and (suspected) exposure to covid-19: Secondary | ICD-10-CM | POA: Insufficient documentation

## 2020-06-16 LAB — POC SARS CORONAVIRUS 2 AG -  ED: SARS Coronavirus 2 Ag: NEGATIVE

## 2020-06-16 MED ORDER — DOXYCYCLINE HYCLATE 100 MG PO CAPS: 100.0000 mg | ORAL_CAPSULE | Freq: Two times a day (BID) | ORAL | 0 refills | Status: DC

## 2020-06-16 NOTE — ED Provider Notes (Signed)
[facility] EMERGENCY DEPARTMENT Provider Note   CSN: [id number] Arrival date & time: 06/16/20  2105     History Chief Complaint  Patient presents with   Cough    Bobby Bridges is a 45 y.o. male.  Patient complains of a cough nonproductive.  No fevers no chills no shortness of breath  The history is provided by medical records. No language interpreter was used.  Cough Cough characteristics:  Non-productive Sputum characteristics:  Nondescript Severity:  Moderate Onset quality:  Sudden Timing:  Constant Progression:  Worsening Chronicity:  New Smoker: no   Associated symptoms: no chest pain, no eye discharge, no headaches and no rash        Past Medical History:  Diagnosis Date   Anxiety    Asthma    Chronic back pain    Depression    GERD (gastroesophageal reflux disease)    Hearing loss in left ear    Hypertension    Seizures (HCC)    outgrew by age 29   Sleep apnea sleep apnea    Patient Active Problem List   Diagnosis Date Noted   Elevated troponin 09/22/2014   Nausea 09/22/2014   Tick bite 09/22/2014   Major depression 02/04/2012   Major depressive disorder, recurrent episode (HCC) 12/02/2011    Class: Acute   REDNESS OR DISCHARGE OF EYE 02/03/2008   ALOPECIA 02/03/2008   HYPERLIPIDEMIA 06/27/2006   ALLERGIC RHINITIS, SEASONAL 06/27/2006   SLEEP APNEA 06/27/2006   HYPERGLYCEMIA 06/27/2006   OBESITY NOS 04/23/2006   DEPRESSION 04/23/2006   HYPERTENSION 04/23/2006   ALLERGIC RHINITIS 04/23/2006   BRONCHITIS NOS 04/23/2006   ASTHMA 04/23/2006   GERD 04/23/2006   SEIZURE DISORDER 04/23/2006    History reviewed. No pertinent surgical history.     Family History  Problem Relation Age of Onset   Cancer Mother    Hypertension Mother    Cancer Father    Hypertension Father    Heart disease Other        "before 44 years old"    Social History   Tobacco Use   Smoking status: Former Smoker     Packs/day: 1.00    Years: 2.00    Pack years: 2.00    Types: Cigarettes    Quit date: 04/08/2012    Years since quitting: 8.1   Smokeless tobacco: Never Used  Vaping Use   Vaping Use: Never used  Substance Use Topics   Alcohol use: No   Drug use: No    Home Medications Prior to Admission medications   Medication Sig Start Date End Date Taking? Authorizing Provider  doxycycline (VIBRAMYCIN) 100 MG capsule Take 1 capsule (100 mg total) by mouth 2 (two) times daily. One po bid x 7 days 06/16/20  Yes Bethann Berkshire, MD  acetaminophen (TYLENOL) 500 MG tablet Take 500 mg by mouth every 6 (six) hours as needed for mild pain or moderate pain.    [provider]  diazepam (VALIUM) 5 MG tablet Take 1 tablet (5 mg total) by mouth 2 (two) times daily. 02/04/18   Jacalyn Lefevre, MD  hydrochlorothiazide (HYDRODIURIL) 25 MG tablet Take 1 tablet (25 mg total) by mouth daily. 01/26/19   Burgess Amor, PA-C  HYDROcodone-acetaminophen (NORCO/VICODIN) 5-325 MG tablet Take 1 tablet by mouth every 6 (six) hours as needed for severe pain. 01/31/20   Cristina Gong, PA-C  ondansetron (ZOFRAN-ODT) 4 MG disintegrating tablet Take 1 tablet (4 mg total) by mouth every 8 (eight) hours as  needed for nausea or vomiting. 05/09/18   Benjiman Core, MD  oseltamivir (TAMIFLU) 75 MG capsule Take 1 capsule (75 mg total) by mouth every 12 (twelve) hours. 05/09/18   Benjiman Core, MD  oxyCODONE-acetaminophen (PERCOCET/ROXICET) 5-325 MG tablet Take 1 tablet by mouth every 4 (four) hours as needed for severe pain. 02/07/18   Jacalyn Lefevre, MD  sildenafil (VIAGRA) 50 MG tablet TAKE 1 TABLET BY MOUTH ONCE DAILY. TAKE 1 TABLET BY MOUTH 30 MINUTES PRIOR TO COITUS. 12/24/18   [provider]  traZODone (DESYREL) 100 MG tablet TAKE 1 TABLET BY MOUTH AT BEDTIME FOR SLEEP 12/24/18   [provider]  zolpidem (AMBIEN) 5 MG tablet Take 1 tablet (5 mg total) by mouth at bedtime as needed for sleep.  10/19/17   Donnetta Hutching, MD    Allergies    Patient has no known allergies.  Review of Systems   Review of Systems  Constitutional: Negative for appetite change and fatigue.  HENT: Negative for congestion, ear discharge and sinus pressure.   Eyes: Negative for discharge.  Respiratory: Positive for cough.   Cardiovascular: Negative for chest pain.  Gastrointestinal: Negative for abdominal pain and diarrhea.  Genitourinary: Negative for frequency and hematuria.  Musculoskeletal: Negative for back pain.  Skin: Negative for rash.  Neurological: Negative for seizures and headaches.  Psychiatric/Behavioral: Negative for hallucinations.    Physical Exam Updated Vital Signs BP (!) 160/103    Pulse (!) 131    Temp 98.4 F (36.9 C)    Resp 18    Ht 6\' 1"  (1.854 m)    Wt (!) 149.7 kg    SpO2 98%    BMI 43.54 kg/m   Physical Exam Vitals and nursing note reviewed.  Constitutional:      Appearance: He is well-developed.  HENT:     Head: Normocephalic.     Mouth/Throat:     Mouth: Mucous membranes are moist.  Eyes:     General: No scleral icterus.    Conjunctiva/sclera: Conjunctivae normal.  Neck:     Thyroid: No thyromegaly.  Cardiovascular:     Rate and Rhythm: Normal rate and regular rhythm.     Heart sounds: No murmur heard. No friction rub. No gallop.   Pulmonary:     Breath sounds: No stridor. No wheezing or rales.  Chest:     Chest wall: No tenderness.  Abdominal:     General: There is no distension.     Tenderness: There is no abdominal tenderness. There is no rebound.  Musculoskeletal:        General: Normal range of motion.     Cervical back: Neck supple.  Lymphadenopathy:     Cervical: No cervical adenopathy.  Skin:    Findings: No erythema or rash.  Neurological:     Mental Status: He is alert and oriented to person, place, and time.     Motor: No abnormal muscle tone.     Coordination: Coordination normal.  Psychiatric:        Behavior: Behavior normal.      ED Results / Procedures / Treatments   Labs (all labs ordered are listed, but only abnormal results are displayed) Labs Reviewed  POC SARS CORONAVIRUS 2 AG -  ED    EKG None  Radiology DG Chest Port 1 View  Result Date: 06/16/2020 CLINICAL DATA:  Cough EXAM: PORTABLE CHEST 1 VIEW COMPARISON:  01/26/2019 FINDINGS: Right costophrenic angle is partially excluded. Visualized lungs are clear. No pneumothorax or  pleural effusion. Mild cardiomegaly is stable. Pulmonary vascularity is normal. IMPRESSION: No active disease.  Stable cardiomegaly. Electronically Signed   By: Helyn Numbers MD   On: 06/16/2020 22:21    Procedures Procedures   Medications Ordered in ED Medications - No data to display  ED Course  I have reviewed the triage vital signs and the nursing notes.  Pertinent labs & imaging results that were available during my care of the patient were reviewed by me and considered in my medical decision making (see chart for details).    MDM Rules/Calculators/A&P                          Patient will be covered with doxycycline for dry cough.  Patient may have an atypical respiratory infection Final Clinical Impression(s) / ED Diagnoses Final diagnoses:  Cough    Rx / DC Orders ED Discharge Orders         Ordered    doxycycline (VIBRAMYCIN) 100 MG capsule  2 times daily        06/16/20 2249           Bethann Berkshire, MD 06/19/20 1206

## 2020-06-16 NOTE — Discharge Instructions (Addendum)
Follow up next week if not improving. °

## 2020-06-16 NOTE — ED Triage Notes (Signed)
Pt states that he has a "real bad cough" that started today and that it hurts in his throat when he coughs.

## 2020-09-03 ENCOUNTER — Encounter (HOSPITAL_COMMUNITY): Payer: Self-pay

## 2020-09-03 ENCOUNTER — Emergency Department (HOSPITAL_COMMUNITY)
Admission: EM | Admit: 2020-09-03 | Discharge: 2020-09-03 | Disposition: A | Discharge status: Home / Self Care | Payer: Self-pay | Attending: Emergency Medicine | Admitting: Emergency Medicine

## 2020-09-03 DIAGNOSIS — Z87891 Personal history of nicotine dependence: Secondary | ICD-10-CM | POA: Insufficient documentation

## 2020-09-03 DIAGNOSIS — I10 Essential (primary) hypertension: Secondary | ICD-10-CM | POA: Insufficient documentation

## 2020-09-03 DIAGNOSIS — L03012 Cellulitis of left finger: Secondary | ICD-10-CM | POA: Insufficient documentation

## 2020-09-03 DIAGNOSIS — J45909 Unspecified asthma, uncomplicated: Secondary | ICD-10-CM | POA: Insufficient documentation

## 2020-09-03 DIAGNOSIS — Z79899 Other long term (current) drug therapy: Secondary | ICD-10-CM | POA: Insufficient documentation

## 2020-09-03 MED ORDER — SULFAMETHOXAZOLE-TRIMETHOPRIM 800-160 MG PO TABS: 1.0000 | ORAL_TABLET | Freq: Two times a day (BID) | ORAL | 0 refills | Status: DC

## 2020-09-03 NOTE — ED Triage Notes (Addendum)
Pt presents with complaints of pain and swelling to right pointer finger. Reports he is not sure when it started but he noticed it today. Patient does not recall injuring his finger. Patient has yellow discoloration around his nail bed.

## 2020-09-03 NOTE — Discharge Instructions (Signed)
Return if any problems.

## 2020-09-03 NOTE — ED Provider Notes (Addendum)
[facility] EMERGENCY DEPARTMENT Provider Note   CSN: [id number] Arrival date & time: 09/03/20  1145     History Chief Complaint  Patient presents with  . Hand Pain    JUNIOR HUEZO is a 45 y.o. male.  The history is provided by the patient. No language interpreter was used.  Hand Pain This is a new problem. The problem occurs constantly. The problem has not changed since onset.Nothing aggravates the symptoms. Nothing relieves the symptoms. He has tried nothing for the symptoms. The treatment provided no relief.   Pt complains of swelling around nail of right 3rd  finger     Past Medical History:  Diagnosis Date  . Anxiety   . Asthma   . Chronic back pain   . Depression   . GERD (gastroesophageal reflux disease)   . Hearing loss in left ear   . Hypertension   . Seizures (HCC)    outgrew by age 74  . Sleep apnea sleep apnea    Patient Active Problem List   Diagnosis Date Noted  . Elevated troponin 09/22/2014  . Nausea 09/22/2014  . Tick bite 09/22/2014  . Major depression 02/04/2012  . Major depressive disorder, recurrent episode (HCC) 12/02/2011    Class: Acute  . REDNESS OR DISCHARGE OF EYE 02/03/2008  . ALOPECIA 02/03/2008  . HYPERLIPIDEMIA 06/27/2006  . ALLERGIC RHINITIS, SEASONAL 06/27/2006  . SLEEP APNEA 06/27/2006  . HYPERGLYCEMIA 06/27/2006  . OBESITY NOS 04/23/2006  . DEPRESSION 04/23/2006  . HYPERTENSION 04/23/2006  . ALLERGIC RHINITIS 04/23/2006  . BRONCHITIS NOS 04/23/2006  . ASTHMA 04/23/2006  . GERD 04/23/2006  . SEIZURE DISORDER 04/23/2006    History reviewed. No pertinent surgical history.     Family History  Problem Relation Age of Onset  . Cancer Mother   . Hypertension Mother   . Cancer Father   . Hypertension Father   . Heart disease Other        "before 44 years old"    Social History   Tobacco Use  . Smoking status: Former Smoker    Packs/day: 1.00    Years: 2.00    Pack years: 2.00    Types: Cigarettes     Quit date: 04/08/2012    Years since quitting: 8.4  . Smokeless tobacco: Never Used  Vaping Use  . Vaping Use: Never used  Substance Use Topics  . Alcohol use: No  . Drug use: No    Home Medications Prior to Admission medications   Medication Sig Start Date End Date Taking? Authorizing Provider  acetaminophen (TYLENOL) 500 MG tablet Take 500 mg by mouth every 6 (six) hours as needed for mild pain or moderate pain.    [provider]  diazepam (VALIUM) 5 MG tablet Take 1 tablet (5 mg total) by mouth 2 (two) times daily. 02/04/18   Jacalyn Lefevre, MD  doxycycline (VIBRAMYCIN) 100 MG capsule Take 1 capsule (100 mg total) by mouth 2 (two) times daily. One po bid x 7 days 06/16/20   Bethann Berkshire, MD  hydrochlorothiazide (HYDRODIURIL) 25 MG tablet Take 1 tablet (25 mg total) by mouth daily. 01/26/19   Burgess Amor, PA-C  HYDROcodone-acetaminophen (NORCO/VICODIN) 5-325 MG tablet Take 1 tablet by mouth every 6 (six) hours as needed for severe pain. 01/31/20   Cristina Gong, PA-C  ondansetron (ZOFRAN-ODT) 4 MG disintegrating tablet Take 1 tablet (4 mg total) by mouth every 8 (eight) hours as needed for nausea or vomiting. 05/09/18   Benjiman Core,  MD  oseltamivir (TAMIFLU) 75 MG capsule Take 1 capsule (75 mg total) by mouth every 12 (twelve) hours. 05/09/18   Benjiman Core, MD  oxyCODONE-acetaminophen (PERCOCET/ROXICET) 5-325 MG tablet Take 1 tablet by mouth every 4 (four) hours as needed for severe pain. 02/07/18   Jacalyn Lefevre, MD  sildenafil (VIAGRA) 50 MG tablet TAKE 1 TABLET BY MOUTH ONCE DAILY. TAKE 1 TABLET BY MOUTH 30 MINUTES PRIOR TO COITUS. 12/24/18   [provider]  traZODone (DESYREL) 100 MG tablet TAKE 1 TABLET BY MOUTH AT BEDTIME FOR SLEEP 12/24/18   [provider]  zolpidem (AMBIEN) 5 MG tablet Take 1 tablet (5 mg total) by mouth at bedtime as needed for sleep. 10/19/17   Donnetta Hutching, MD    Allergies    Patient has no known  allergies.  Review of Systems   Review of Systems  All other systems reviewed and are negative.   Physical Exam Updated Vital Signs BP (!) 113/97   Pulse (!) 106   Temp 98.9 F (37.2 C)   SpO2 98%   Physical Exam Vitals reviewed.  Musculoskeletal:        General: Swelling present.  Skin:    Findings: Erythema present.  Neurological:     General: No focal deficit present.     Mental Status: He is alert.  Psychiatric:        Mood and Affect: Mood normal.     ED Results / Procedures / Treatments   Labs (all labs ordered are listed, but only abnormal results are displayed) Labs Reviewed - No data to display  EKG None  Radiology No results found.  Procedures Procedures   Medications Ordered in ED Medications - No data to display  ED Course  I have reviewed the triage vital signs and the nursing notes.  Pertinent labs & imaging results that were available during my care of the patient were reviewed by me and considered in my medical decision making (see chart for details). Procedure  Betadine, freeze spray, drained with 18 gauge     MDM Rules/Calculators/A&P                          MDM: Pt given rx for bactrim,  Pt advised to soak 20 minutes 4 times a day Final Clinical Impression(s) / ED Diagnoses Final diagnoses:  Paronychia of finger of left hand    Rx / DC Orders ED Discharge Orders         Ordered    sulfamethoxazole-trimethoprim (BACTRIM DS) 800-160 MG tablet  2 times daily        09/03/20 1221        An After Visit Summary was printed and given to the patient.    Elson Areas, PA-C 09/03/20 [address] [place] 09/03/20 1230    Maia Plan, MD 09/04/20 [phone]

## 2020-09-17 ENCOUNTER — Emergency Department (HOSPITAL_COMMUNITY)
Admission: EM | Admit: 2020-09-17 | Discharge: 2020-09-17 | Disposition: A | Discharge status: Home / Self Care | Payer: Self-pay | Attending: Emergency Medicine | Admitting: Emergency Medicine

## 2020-09-17 ENCOUNTER — Encounter (HOSPITAL_COMMUNITY): Payer: Self-pay | Admitting: *Deleted

## 2020-09-17 ENCOUNTER — Other Ambulatory Visit: Payer: Self-pay

## 2020-09-17 DIAGNOSIS — S51852A Open bite of left forearm, initial encounter: Secondary | ICD-10-CM | POA: Insufficient documentation

## 2020-09-17 DIAGNOSIS — W503XXA Accidental bite by another person, initial encounter: Secondary | ICD-10-CM | POA: Insufficient documentation

## 2020-09-17 DIAGNOSIS — J45909 Unspecified asthma, uncomplicated: Secondary | ICD-10-CM | POA: Insufficient documentation

## 2020-09-17 DIAGNOSIS — Z87891 Personal history of nicotine dependence: Secondary | ICD-10-CM | POA: Insufficient documentation

## 2020-09-17 DIAGNOSIS — Y990 Civilian activity done for income or pay: Secondary | ICD-10-CM | POA: Insufficient documentation

## 2020-09-17 DIAGNOSIS — I10 Essential (primary) hypertension: Secondary | ICD-10-CM | POA: Insufficient documentation

## 2020-09-17 DIAGNOSIS — Z79899 Other long term (current) drug therapy: Secondary | ICD-10-CM | POA: Insufficient documentation

## 2020-09-17 MED ORDER — AMOXICILLIN-POT CLAVULANATE 875-125 MG PO TABS: 1.0000 | ORAL_TABLET | Freq: Two times a day (BID) | ORAL | 0 refills | Status: DC
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
## 2020-09-17 NOTE — Discharge Instructions (Addendum)
Take Augmentin twice a day to prevent infection  Ibuprofen or Tylenol for pain  ER for worsening symptoms

## 2021-05-29 ENCOUNTER — Ambulatory Visit (INDEPENDENT_AMBULATORY_CARE_PROVIDER_SITE_OTHER): Payer: 59 | Admitting: Nurse Practitioner

## 2021-05-29 ENCOUNTER — Other Ambulatory Visit: Payer: Self-pay

## 2021-05-29 ENCOUNTER — Telehealth: Payer: Self-pay

## 2021-05-29 ENCOUNTER — Encounter: Payer: Self-pay | Admitting: Nurse Practitioner

## 2021-05-29 VITALS — BP 146/93 | HR 94 | Ht 71.0 in | Wt >= 6400 oz

## 2021-05-29 DIAGNOSIS — E1169 Type 2 diabetes mellitus with other specified complication: Secondary | ICD-10-CM

## 2021-05-29 DIAGNOSIS — N529 Male erectile dysfunction, unspecified: Secondary | ICD-10-CM

## 2021-05-29 DIAGNOSIS — I10 Essential (primary) hypertension: Secondary | ICD-10-CM

## 2021-05-29 DIAGNOSIS — M25471 Effusion, right ankle: Secondary | ICD-10-CM

## 2021-05-29 DIAGNOSIS — Z6841 Body Mass Index (BMI) 40.0 and over, adult: Secondary | ICD-10-CM

## 2021-05-29 DIAGNOSIS — G4709 Other insomnia: Secondary | ICD-10-CM | POA: Diagnosis not present

## 2021-05-29 DIAGNOSIS — E785 Hyperlipidemia, unspecified: Secondary | ICD-10-CM

## 2021-05-29 DIAGNOSIS — J4520 Mild intermittent asthma, uncomplicated: Secondary | ICD-10-CM | POA: Diagnosis not present

## 2021-05-29 DIAGNOSIS — M25472 Effusion, left ankle: Secondary | ICD-10-CM

## 2021-05-29 MED ORDER — ALBUTEROL SULFATE HFA 108 (90 BASE) MCG/ACT IN AERS: 2.0000 | INHALATION_SPRAY | Freq: Four times a day (QID) | RESPIRATORY_TRACT | 0 refills | Status: DC | PRN

## 2021-05-29 MED ORDER — HYDROCHLOROTHIAZIDE 25 MG PO TABS: 25.0000 mg | ORAL_TABLET | Freq: Every day | ORAL | 0 refills | Status: DC

## 2021-05-29 MED ORDER — TRAZODONE HCL 100 MG PO TABS: ORAL_TABLET | ORAL | 1 refills | Status: DC

## 2021-05-29 NOTE — Progress Notes (Addendum)
New Patient Office Visit  Subjective:  Patient ID: Bobby Bridges, male    DOB: March 09, 1976  Age: 46 y.o. MRN: [id number]  CC:  Chief Complaint  Patient presents with   New Patient (Initial Visit)    NP leg swelling for a few months trouble falling asleep at night   Erectile Dysfunction    Having problems     HPI Bobby Bridges with PMH of  HTN, ED, morbid obesity, T2DM, HLD, Insomnia, asthma, anxiety and depression presents to establish care for his chronic medical conditions.  . Previous PCP Dr Tamela Oddi at [facility] in [place].   Ankle edema. Pt c/o chronic ankle swelling, denies pain, numbness, tingling has not done  anything to help the swelling  Insomnia Pt c/o chronic insomnia, work first and second shift , when he gets home its difficult, he does not sleep until about 1 am and then he wakes up at 5 am., feels a little tired when he wakes up. A lot of people tell him that he snores, he has never done a sleep study before. Drinks one cup of coffee a day in the morning, denies drinking coffee in the evening, putting on the air makes him go to sleep. Has a diagnosis of sleep apnea but does not wear cpap. He has used trazodone 165m , ambien 558m and melatonin but they did not work.   HTN. PT stated that he was previously taking hydrochlorothiazide 2532maily, amlodipine 5mg37mily, losartan 50mg35mly until about a month ago, he stated that he was on the medication for less than a year, the medication was making his stomach hurts, he does not check BP at home, pt states that he can tell when his BP is high, he starts having HA, denies dizziness. M  T2DM. Last A1C was 5.9  on 9/22 .pt states that he was on metformin 500mg 72m,  he stopped taking metformin about a year ago due to diarrhea,   HLD.  Last LDL in 09/22 was 176, pt stated that he has never been on a cholesterol lowering agent. Looking at his record from previous PCP he was on crestor 20mg a73me  point in time  Anxiety and depression. Pt denies depression, he had depression when he lost his mom in 2000, but he is doing fine, denies SI, HI. PHQ9 score -0  Asthma. Pt states that he does get SOB with wheezing sometimes, he has not been using any inhaler, he stated that he used albuterol inhaler a long time ago .  ED. He has chronic ED, he stated that he stopped using Viagra 50mg du69m making his HR go fast during intercourse. Has urinary frequency, denies dysuria, sometimes has incontinence if he does not go to the bathroom quickly...  Mobid obesity. He has been eating fried foods and drinking sodaHe has never done anything about his weight , now he is trying to start engaging in regular vigorous exercise 30 minutes 7 days a week and eat right.   Due for flu vaccine and covid vaccines, need fro both vaccines discussed with pt he verbalized understanding, refused flu vaccine today.   Past Medical History:  Diagnosis Date   Anxiety    Asthma    Chronic back pain    Depression    GERD (gastroesophageal reflux disease)    Hearing loss in left ear    Hypertension    Seizures (HCC)    [place] by age 46  Sleep apnea sleep apnea    No past surgical history on file.  Family History  Problem Relation Age of Onset   Cancer Mother    Hypertension Mother    Cancer Father    Hypertension Father    Heart disease Other        "before 45 years old"    Social History   Socioeconomic History   Marital status: Single    Spouse name: Not on file   Number of children: 1   Years of education: Not on file   Highest education level: Not on file  Occupational History   Not on file  Tobacco Use   Smoking status: Former    Packs/day: 1.00    Years: 2.00    Pack years: 2.00    Types: Cigarettes    Quit date: 04/08/2012    Years since quitting: 9.1   Smokeless tobacco: Never  Vaping Use   Vaping Use: Never used  Substance and Sexual Activity   Alcohol use: No   Drug use: No    Sexual activity: Yes  Other Topics Concern   Not on file  Social History Narrative   Lives home alone, pt is divorced.    Social Determinants of Health   Financial Resource Strain: Not on file  Food Insecurity: Not on file  Transportation Needs: Not on file  Physical Activity: Not on file  Stress: Not on file  Social Connections: Not on file  Intimate Partner Violence: Not on file    ROS Review of Systems  Constitutional: Negative.   Respiratory:  Positive for shortness of breath and wheezing. Negative for apnea, cough, choking and chest tightness.   Cardiovascular:  Positive for leg swelling.  Gastrointestinal: Negative.   Genitourinary: Negative.   Psychiatric/Behavioral: Negative.         Insomnia   Objective:   Today's Vitals: BP (!) 146/93 (BP Location: Left Arm, Cuff Size: Large)    Pulse 94    Ht 5' 11" (1.803 m)    Wt (!) 431 lb (195.5 kg)    SpO2 99%    BMI 60.11 kg/m   Physical Exam Constitutional:      General: He is not in acute distress.    Appearance: He is obese. He is not ill-appearing, toxic-appearing or diaphoretic.  Cardiovascular:     Rate and Rhythm: Normal rate and regular rhythm.     Pulses: Normal pulses.     Heart sounds: Normal heart sounds. No murmur heard.   No friction rub. No gallop.  Pulmonary:     Effort: Pulmonary effort is normal. No respiratory distress.     Breath sounds: Normal breath sounds. No stridor. No wheezing, rhonchi or rales.  Chest:     Chest wall: No tenderness.  Abdominal:     Palpations: Abdomen is soft.  Musculoskeletal:        General: No swelling, tenderness, deformity or signs of injury.     Right lower leg: No edema.     Left lower leg: No edema.  Skin:    Capillary Refill: Capillary refill takes less than 2 seconds.  Neurological:     Mental Status: He is alert.  Psychiatric:        Mood and Affect: Mood normal.        Behavior: Behavior normal.        Judgment: Judgment normal.    Assessment & Plan:    Problem List Items Addressed This Visit  Cardiovascular and Mediastinum   High blood pressure - Primary    BP Readings from Last 3 Encounters:  05/29/21 (!) 146/93  09/17/20 (!) 145/115  09/03/20 (!) 113/97  stated that he was previously taking hydrochlorothiazide 19m daily, amlodipine 558mdaily, losartan 5014maily until about a month ago.  Pt told to start taking hydrochlorothiazide 25 mg daily daily, will adjust medication if his BP is not at goal at next visit in 4 weeks Pt advised on DASH diet, loose weight.  CMP+EGFR today      Relevant Medications   hydrochlorothiazide (HYDRODIURIL) 25 MG tablet   traZODone (DESYREL) 100 MG tablet   Other Relevant Orders   CMP14+EGFR (Completed)     Respiratory   Mild intermittent asthma without complication   Relevant Medications   albuterol (VENTOLIN HFA) 108 (90 Base) MCG/ACT inhaler     Endocrine   Hyperlipidemia associated with type 2 diabetes mellitus (HCCDe Kalb  Last LDL in 09/22 was 176, pt stated that he has never been on a cholesterol lowering agent. Looking at his record from previous PCP he was on crestor 5m68m one point in time Check Lipid panel before at visit Plan to start pt on a statin if LDL is not at goal      Relevant Medications   hydrochlorothiazide (HYDRODIURIL) 25 MG tablet     Other   OBESITY NOS    He has been eating fried foods and drinking sodaHe has never done anything about his weight , now he is trying to start engaging in regular vigorous exercise 30 minutes 7 days a week and eat right. His insurance will not cover weight management meds, will plan to refer pt to medical weight management clinic at next visit.   Wt Readings from Last 3 Encounters:  05/29/21 (!) 431 lb (195.5 kg)  09/17/20 (!) 415 lb (188.2 kg)  06/16/20 (!) 330 lb 0.5 oz (149.7 kg)    Importance of healthy food choices with portion control discussed as well as eating regularly within 12  hour window.   The need to choose  clean green food 50%-75% of time is discussed as well as make water the primary drink and set a goal for 64 ounces daily.  Patient reeducated about the importance of committment to minimum of 150 minutes of exercise per week.  Three meals at set times with snacks allowed between meals but they must be fruit or vegetable.   Aim to eat  over 12 hour period  for example 7 am to 7 pm. Stop after your last meal of the day.        Erectile dysfunction    Viagra 50mg82m to making his HR go fast during intercourse. Will refer pt to urology at next visit , if problem persists.       Ankle edema, bilateral    No edema noted on examination today Wear ted hose as needed for edema, keep legs elevated when sitting restart HCTZ 25mg 9my CMP=EGFR today         Outpatient Encounter Medications as of 05/29/2021  Medication Sig   acetaminophen (TYLENOL) 500 MG tablet Take 500 mg by mouth every 6 (six) hours as needed for mild pain or moderate pain.   albuterol (VENTOLIN HFA) 108 (90 Base) MCG/ACT inhaler Inhale 2 puffs into the lungs every 6 (six) hours as needed for wheezing or shortness of breath.   hydrochlorothiazide (HYDRODIURIL) 25 MG tablet Take 1 tablet (25 mg total) by  mouth daily.   traZODone (DESYREL) 100 MG tablet TAKE 1 TABLET BY MOUTH AT BEDTIME FOR SLEEP   [DISCONTINUED] amoxicillin-clavulanate (AUGMENTIN) 875-125 MG tablet Take 1 tablet by mouth every 12 (twelve) hours.   [DISCONTINUED] diazepam (VALIUM) 5 MG tablet Take 1 tablet (5 mg total) by mouth 2 (two) times daily. (Patient not taking: Reported on 05/29/2021)   [DISCONTINUED] doxycycline (VIBRAMYCIN) 100 MG capsule Take 1 capsule (100 mg total) by mouth 2 (two) times daily. One po bid x 7 days (Patient not taking: Reported on 05/29/2021)   [DISCONTINUED] hydrochlorothiazide (HYDRODIURIL) 25 MG tablet Take 1 tablet (25 mg total) by mouth daily. (Patient not taking: Reported on 05/29/2021)   [DISCONTINUED]  HYDROcodone-acetaminophen (NORCO/VICODIN) 5-325 MG tablet Take 1 tablet by mouth every 6 (six) hours as needed for severe pain. (Patient not taking: Reported on 05/29/2021)   [DISCONTINUED] ondansetron (ZOFRAN-ODT) 4 MG disintegrating tablet Take 1 tablet (4 mg total) by mouth every 8 (eight) hours as needed for nausea or vomiting. (Patient not taking: Reported on 05/29/2021)   [DISCONTINUED] oseltamivir (TAMIFLU) 75 MG capsule Take 1 capsule (75 mg total) by mouth every 12 (twelve) hours. (Patient not taking: Reported on 05/29/2021)   [DISCONTINUED] oxyCODONE-acetaminophen (PERCOCET/ROXICET) 5-325 MG tablet Take 1 tablet by mouth every 4 (four) hours as needed for severe pain. (Patient not taking: Reported on 05/29/2021)   [DISCONTINUED] sildenafil (VIAGRA) 50 MG tablet TAKE 1 TABLET BY MOUTH ONCE DAILY. TAKE 1 TABLET BY MOUTH 30 MINUTES PRIOR TO COITUS. (Patient not taking: Reported on 05/29/2021)   [DISCONTINUED] sulfamethoxazole-trimethoprim (BACTRIM DS) 800-160 MG tablet Take 1 tablet by mouth 2 (two) times daily. (Patient not taking: Reported on 05/29/2021)   [DISCONTINUED] traZODone (DESYREL) 100 MG tablet TAKE 1 TABLET BY MOUTH AT BEDTIME FOR SLEEP (Patient not taking: Reported on 05/29/2021)   [DISCONTINUED] zolpidem (AMBIEN) 5 MG tablet Take 1 tablet (5 mg total) by mouth at bedtime as needed for sleep. (Patient not taking: Reported on 05/29/2021)   No facility-administered encounter medications on file as of 05/29/2021.    Follow-up: Return in about 4 weeks (around 06/26/2021) for HTN.   Renee Rival, FNP

## 2021-05-29 NOTE — Telephone Encounter (Signed)
Pt came back to the office and brought in the prescription bottles of what he was prescribed previously, it was hydrochlorothizide 25 mg once daily and the other one was a sample bottle of losartan 50 mg with no instructions.

## 2021-05-29 NOTE — Patient Instructions (Addendum)
Please get your lab done today Take hydrochlorothiazide 25mg   daily for blood pressure Take trazodone 100mg  at bedtime for sleep    It is important that you exercise regularly at least 30 minutes 5 times a week.  Think about what you will eat, plan ahead. Choose " clean, green, fresh or frozen" over canned, processed or packaged foods which are more sugary, salty and fatty. 70 to 75% of food eaten should be vegetables and fruit. Three meals at set times with snacks allowed between meals, but they must be fruit or vegetables. Aim to eat over a 12 hour period , example 7 am to 7 pm, and STOP after  your last meal of the day. Drink water,generally about 64 ounces per day, no other drink is as healthy. Fruit juice is best enjoyed in a healthy way, by EATING the fruit.  Thanks for choosing [facility], we consider it a privelige to serve you.

## 2021-05-30 ENCOUNTER — Encounter: Payer: Self-pay | Admitting: Nurse Practitioner

## 2021-05-30 ENCOUNTER — Other Ambulatory Visit: Payer: Self-pay | Admitting: Nurse Practitioner

## 2021-05-30 DIAGNOSIS — E1169 Type 2 diabetes mellitus with other specified complication: Secondary | ICD-10-CM | POA: Insufficient documentation

## 2021-05-30 DIAGNOSIS — J4520 Mild intermittent asthma, uncomplicated: Secondary | ICD-10-CM | POA: Insufficient documentation

## 2021-05-30 DIAGNOSIS — N529 Male erectile dysfunction, unspecified: Secondary | ICD-10-CM | POA: Insufficient documentation

## 2021-05-30 DIAGNOSIS — G4700 Insomnia, unspecified: Secondary | ICD-10-CM | POA: Insufficient documentation

## 2021-05-30 DIAGNOSIS — M25471 Effusion, right ankle: Secondary | ICD-10-CM | POA: Insufficient documentation

## 2021-05-30 DIAGNOSIS — I10 Essential (primary) hypertension: Secondary | ICD-10-CM

## 2021-05-30 LAB — CMP14+EGFR
ALT: 29 IU/L (ref 0–44)
AST: 24 IU/L (ref 0–40)
Albumin/Globulin Ratio: 1.3 (ref 1.2–2.2)
Albumin: 4.1 g/dL (ref 4.0–5.0)
Alkaline Phosphatase: 111 IU/L (ref 44–121)
BUN/Creatinine Ratio: 15 (ref 9–20)
BUN: 13 mg/dL (ref 6–24)
Bilirubin Total: 0.2 mg/dL (ref 0.0–1.2)
CO2: 24 mmol/L (ref 20–29)
Calcium: 9.4 mg/dL (ref 8.7–10.2)
Chloride: 106 mmol/L (ref 96–106)
Creatinine, Ser: 0.86 mg/dL (ref 0.76–1.27)
Globulin, Total: 3.2 g/dL (ref 1.5–4.5)
Glucose: 85 mg/dL (ref 70–99)
Potassium: 4.2 mmol/L (ref 3.5–5.2)
Sodium: 143 mmol/L (ref 134–144)
Total Protein: 7.3 g/dL (ref 6.0–8.5)
eGFR: 109 mL/min/{1.73_m2} (ref >59)

## 2021-05-30 NOTE — Telephone Encounter (Signed)
Called pt to relay Fola's message no answer no vm 

## 2021-05-30 NOTE — Assessment & Plan Note (Signed)
No edema noted on examination today °Wear ted hose as needed for edema, keep legs elevated when sitting °restart HCTZ 25mg daily °CMP=EGFR today ° ° °

## 2021-05-30 NOTE — Assessment & Plan Note (Signed)
He has used trazodone 100mg  , ambien 5mg   and melatonin but they did not work. Insomnia probably due to sleep apnea He states that he never had a sleep study done before Sleep study ordered today Restart trazodone 100mg  daily at bedtime.

## 2021-05-30 NOTE — Assessment & Plan Note (Signed)
BP Readings from Last 3 Encounters:  05/29/21 (!) 146/93  09/17/20 (!) 145/115  09/03/20 (!) 113/97  stated that he was previously taking hydrochlorothiazide 25mg  daily, amlodipine 5mg  daily, losartan 50mg  daily until about a month ago.  Pt told to start taking hydrochlorothiazide 25 mg daily daily, will adjust medication if his BP is not at goal at next visit in 4 weeks Pt advised on DASH diet, loose weight.  CMP+EGFR today

## 2021-05-30 NOTE — Assessment & Plan Note (Signed)
he stated that he used albuterol inhaler a long time ago .sometimes gets SOB with wheezing RX albuterol inhaler take 2 puffs every 6 hours PRN SOB, wheezing

## 2021-05-30 NOTE — Assessment & Plan Note (Signed)
Last LDL in 09/22 was 176, pt stated that he has never been on a cholesterol lowering agent. Looking at his record from previous PCP he was on crestor 20mg  at one point in time Check Lipid panel before at visit Plan to start pt on a statin if LDL is not at goal

## 2021-05-30 NOTE — Assessment & Plan Note (Signed)
He has been eating fried foods and drinking sodaHe has never done anything about his weight , now he is trying to start engaging in regular vigorous exercise 30 minutes 7 days a week and eat right. His insurance will not cover weight management meds, will plan to refer pt to medical weight management clinic at next visit.   Wt Readings from Last 3 Encounters:  05/29/21 (!) 431 lb (195.5 kg)  09/17/20 (!) 415 lb (188.2 kg)  06/16/20 (!) 330 lb 0.5 oz (149.7 kg)     Importance of healthy food choices with portion control discussed as well as eating regularly within 12  hour window.   The need to choose clean green food 50%-75% of time is discussed as well as make water the primary drink and set a goal for 64 ounces daily.  Patient reeducated about the importance of committment to minimum of 150 minutes of exercise per week.  Three meals at set times with snacks allowed between meals but they must be fruit or vegetable.   Aim to eat  over 12 hour period  for example 7 am to 7 pm. Stop after your last meal of the day.

## 2021-05-30 NOTE — Progress Notes (Signed)
Please review results with patient.  Kidney function liver function electrolytes are normal.  Patient should start taking hydrochlorothiazide 25 mg daily as discussed with him yesterday.  I have ordered labs to be done in 2 weeks, patient should come to the office and get labs done to make sure his electrolytes are still normal after restarting hydrochlorothiazide.  Thank you

## 2021-05-30 NOTE — Assessment & Plan Note (Signed)
Viagra 50mg  due to making his HR go fast during intercourse. Will refer pt to urology at next visit , if problem persists.

## 2021-05-31 NOTE — Telephone Encounter (Signed)
Spoke with pt advised of Fola's message pt verbalized understanding °

## 2021-06-06 ENCOUNTER — Emergency Department (HOSPITAL_COMMUNITY)
Admission: EM | Admit: 2021-06-06 | Discharge: 2021-06-07 | Disposition: A | Discharge status: Home / Self Care | Payer: 59 | Attending: Emergency Medicine | Admitting: Emergency Medicine

## 2021-06-06 ENCOUNTER — Other Ambulatory Visit: Payer: Self-pay

## 2021-06-06 DIAGNOSIS — L03011 Cellulitis of right finger: Secondary | ICD-10-CM | POA: Diagnosis not present

## 2021-06-06 NOTE — ED Triage Notes (Signed)
Ambulatory to ED with c/o abcess on R ring finger d/t ingrown hair.

## 2021-06-07 MED ORDER — SULFAMETHOXAZOLE-TRIMETHOPRIM 800-160 MG PO TABS: 1.0000 | ORAL_TABLET | Freq: Two times a day (BID) | ORAL | 0 refills | Status: AC

## 2021-06-07 MED ORDER — SULFAMETHOXAZOLE-TRIMETHOPRIM 800-160 MG PO TABS
1.0000 | ORAL_TABLET | Freq: Once | ORAL | Status: AC
  Administered 2021-06-07: 1 via ORAL
  Filled 2021-06-07: qty 1

## 2021-06-07 NOTE — ED Provider Notes (Signed)
?[facility] ?Provider Note ? ? ?CSN: [id number] ?Arrival date & time: 06/06/21  2101 ? ?  ? ?History ? ?Chief Complaint  ?Patient presents with  ? Abscess  ? ? ?Bobby Bridges is a 46 y.o. male. ? ? ?Abscess ?Location:  Finger ?Finger abscess location:  R ring finger ?Abscess quality: fluctuance, induration, painful and redness   ?Red streaking: no   ?Duration:  1 day ?Progression:  Unchanged ?Pain details:  ?  Quality:  Dull, pressure and sharp ?  Severity:  Moderate ?  Timing:  Constant ?Chronicity:  New ? ?  ? ?Home Medications ?Prior to Admission medications   ?Medication Sig Start Date End Date Taking? Authorizing Provider  ?sulfamethoxazole-trimethoprim (BACTRIM DS) 800-160 MG tablet Take 1 tablet by mouth 2 (two) times daily for 7 days. 06/07/21 06/14/21 Yes Cambell Stanek, Corene Cornea, MD  ?acetaminophen (TYLENOL) 500 MG tablet Take 500 mg by mouth every 6 (six) hours as needed for mild pain or moderate pain.    [provider]  ?albuterol (VENTOLIN HFA) 108 (90 Base) MCG/ACT inhaler Inhale 2 puffs into the lungs every 6 (six) hours as needed for wheezing or shortness of breath. 05/29/21   Renee Rival, FNP  ?hydrochlorothiazide (HYDRODIURIL) 25 MG tablet Take 1 tablet (25 mg total) by mouth daily. 05/29/21   Renee Rival, FNP  ?traZODone (DESYREL) 100 MG tablet TAKE 1 TABLET BY MOUTH AT BEDTIME FOR SLEEP 05/29/21   Renee Rival, FNP  ?   ? ?Allergies    ?Patient has no known allergies.   ? ?Review of Systems   ?Review of Systems ? ?Physical Exam ?Updated Vital Signs ?BP (!) 146/105 (BP Location: Left Arm)   Pulse 100   Temp 97.7 ?F (36.5 ?C) (Oral)   Resp 17   Ht 6\' 1"  (1.854 m)   Wt (!) 195 kg   SpO2 97%   BMI 56.73 kg/m?  ?Physical Exam ?Vitals and nursing note reviewed.  ?Constitutional:   ?   Appearance: He is well-developed.  ?HENT:  ?   Head: Normocephalic and atraumatic.  ?   Mouth/Throat:  ?   Mouth: Mucous membranes are moist.  ?   Pharynx: Oropharynx is  clear.  ?Eyes:  ?   Pupils: Pupils are equal, round, and reactive to light.  ?Cardiovascular:  ?   Rate and Rhythm: Normal rate.  ?Pulmonary:  ?   Effort: Pulmonary effort is normal. No respiratory distress.  ?Abdominal:  ?   General: There is no distension.  ?Musculoskeletal:     ?   General: Normal range of motion.  ?   Cervical back: Normal range of motion.  ?Skin: ?   General: Skin is warm and dry.  ?   Comments: Right ring finger paronychia  ?Neurological:  ?   General: No focal deficit present.  ?   Mental Status: He is alert.  ? ? ?ED Results / Procedures / Treatments   ?Labs ?(all labs ordered are listed, but only abnormal results are displayed) ?Labs Reviewed - No data to display ? ?EKG ?None ? ?Radiology ?No results found. ? ?Procedures ?Marland Kitchen.Incision and Drainage ? ?Date/Time: 06/07/2021 2:41 AM ?Performed by: Merrily Pew, MD ?Authorized by: Merrily Pew, MD  ? ?Consent:  ?  Consent obtained:  Verbal ?  Consent given by:  Patient ?  Risks, benefits, and alternatives were discussed: yes   ?  Risks discussed:  Bleeding, incomplete drainage, infection, damage to other organs and pain ?  Alternatives discussed:  No treatment, delayed treatment, alternative treatment, observation and referral ?Universal protocol:  ?  Procedure explained and questions answered to patient or proxy's satisfaction: yes   ?  Immediately prior to procedure, a time out was called: yes   ?  Patient identity confirmed:  Verbally with patient ?Location:  ?  Type:  Abscess ?  Size:  Surroundign half the nail ?  Location:  Upper extremity ?  Upper extremity location:  Finger ?  Finger location:  R ring finger ?Pre-procedure details:  ?  Skin preparation:  Antiseptic wash ?Sedation:  ?  Sedation type:  None ?Anesthesia:  ?  Anesthesia method:  None ?Procedure type:  ?  Complexity:  Simple ?Procedure details:  ?  Incision types:  Stab incision ?  Wound management:  Probed and deloculated ?  Drainage:  Purulent ?  Drainage amount:  Moderate ?   Wound treatment:  Wound left open ?  Packing materials:  None ?Post-procedure details:  ?  Procedure completion:  Tolerated  ? ? ?Medications Ordered in ED ?Medications  ?sulfamethoxazole-trimethoprim (BACTRIM DS) 800-160 MG per tablet 1 tablet (has no administration in time range)  ? ? ?ED Course/ Medical Decision Making/ A&P ?  ?                        ?Medical Decision Making ?Risk ?Prescription drug management. ? ? ?No sepsis. No e/o felon. Drained successfully at bedside. Antibiotics started. Warm soaks at home. PCP to ensure improvement, here if worsening.  ? ? ?Final Clinical Impression(s) / ED Diagnoses ?Final diagnoses:  ?Paronychia of right ring finger  ? ? ?Rx / DC Orders ?ED Discharge Orders   ? ?      Ordered  ?  sulfamethoxazole-trimethoprim (BACTRIM DS) 800-160 MG tablet  2 times daily       ? 06/07/21 0239  ? ?  ?  ? ?  ? ? ?  ?Merrily Pew, MD ?06/07/21 [phone] ? ?

## 2021-06-07 NOTE — ED Notes (Signed)
ED Provider at bedside. 

## 2021-06-23 ENCOUNTER — Ambulatory Visit: Payer: 59 | Admitting: Nurse Practitioner

## 2021-06-26 ENCOUNTER — Ambulatory Visit: Payer: 59 | Admitting: Nurse Practitioner

## 2021-07-21 ENCOUNTER — Ambulatory Visit: Payer: 59 | Admitting: Nurse Practitioner

## 2021-08-08 ENCOUNTER — Ambulatory Visit: Payer: 59 | Admitting: Nurse Practitioner

## 2021-09-01 ENCOUNTER — Ambulatory Visit (INDEPENDENT_AMBULATORY_CARE_PROVIDER_SITE_OTHER): Payer: 59 | Admitting: Nurse Practitioner

## 2021-09-01 ENCOUNTER — Encounter: Payer: Self-pay | Admitting: Nurse Practitioner

## 2021-09-01 VITALS — BP 143/95 | HR 97 | Ht 73.0 in | Wt >= 6400 oz

## 2021-09-01 DIAGNOSIS — G4709 Other insomnia: Secondary | ICD-10-CM

## 2021-09-01 DIAGNOSIS — I10 Essential (primary) hypertension: Secondary | ICD-10-CM | POA: Diagnosis not present

## 2021-09-01 DIAGNOSIS — E1169 Type 2 diabetes mellitus with other specified complication: Secondary | ICD-10-CM | POA: Diagnosis not present

## 2021-09-01 DIAGNOSIS — Z6841 Body Mass Index (BMI) 40.0 and over, adult: Secondary | ICD-10-CM

## 2021-09-01 DIAGNOSIS — E785 Hyperlipidemia, unspecified: Secondary | ICD-10-CM

## 2021-09-01 DIAGNOSIS — E119 Type 2 diabetes mellitus without complications: Secondary | ICD-10-CM | POA: Insufficient documentation

## 2021-09-01 MED ORDER — LOSARTAN POTASSIUM 25 MG PO TABS: 25.0000 mg | ORAL_TABLET | Freq: Every day | ORAL | 1 refills | Status: DC

## 2021-09-01 MED ORDER — TRAZODONE HCL 100 MG PO TABS: ORAL_TABLET | ORAL | 1 refills | Status: DC

## 2021-09-01 MED ORDER — UNABLE TO FIND
0 refills | Status: DC
Start: 2021-09-01 — End: 2022-02-16

## 2021-09-01 NOTE — Assessment & Plan Note (Addendum)
Currently not on medication Avoid fatty fried foods Check lipid panel

## 2021-09-01 NOTE — Patient Instructions (Signed)
Please schedule your diabetic eye exam today  Start taking losartan 25 mg daily for high blood pressure Start taking trazodone 200 mg daily at bedtime as needed for insomnia    It is important that you exercise regularly at least 30 minutes 5 times a week.  Think about what you will eat, plan ahead. Choose " clean, green, fresh or frozen" over canned, processed or packaged foods which are more sugary, salty and fatty. 70 to 75% of food eaten should be vegetables and fruit. Three meals at set times with snacks allowed between meals, but they must be fruit or vegetables. Aim to eat over a 12 hour period , example 7 am to 7 pm, and STOP after  your last meal of the day. Drink water,generally about 64 ounces per day, no other drink is as healthy. Fruit juice is best enjoyed in a healthy way, by EATING the fruit.  Thanks for choosing [facility], we consider it a privelige to serve you.

## 2021-09-01 NOTE — Assessment & Plan Note (Addendum)
Currently not on medication A1c ordered, urine creatinine lab ordered Diabetic eye exam scheduled today

## 2021-09-01 NOTE — Assessment & Plan Note (Addendum)
BP Readings from Last 3 Encounters:  09/01/21 (!) 143/95  06/06/21 (!) 146/105  05/29/21 (!) 146/93  Chronic condition uncontrolled on hydrochlorothiazide 25 mg daily Start losartan 25 mg daily, continue hydrochlorothiazide 25 mg daily goal is BP less than 130/80 Blood pressure monitor ordered patient encouraged to monitor blood pressure at home keep a log and bring to next follow-up appointment DASH diet advised, patient encouraged to engage in regular daily exercises at least 150 minutes weekly. will check BMP in 2 weeks

## 2021-09-01 NOTE — Progress Notes (Signed)
   Bobby Bridges     MRN: [id number]      DOB: 11-07-1975   HPI Bobby Bridges with past medical history of hypertension, sleep apnea, mild intermittent asthma without complication, GERD, hyperlipidemia, obesity is here for follow up for hypertension  Hypertension.  Currently on hydrochlorothiazide 25 mg daily.  Patient denies chest pain, syncope, dizziness  Insomnia.  Currently on trazodone 100 mg daily continues to have problems with insomnia.  Sleep study was ordered at last visit patient stated that he has not had test done.  Patient encouraged to get sleep study done to rule out sleep apnea.      ROS Denies recent fever or chills. Denies sinus pressure, nasal congestion, ear pain or sore throat. Denies chest congestion, productive cough or wheezing. Denies chest pains, palpitations Denies abdominal pain, nausea, vomiting,diarrhea or constipation.   Denies dysuria, frequency, hesitancy or incontinence. Denies joint pain, swelling and limitation in mobility. Denies headaches, seizures, numbness, or tingling. Denies depression, anxiety     PE  BP (!) 142/94 (BP Location: Right Arm, Cuff Size: Large)   Pulse 97   Ht 6\' 1"  (1.854 m)   Wt (!) 425 lb (192.8 kg)   SpO2 96%   BMI 56.07 kg/m   Patient alert and oriented and in no cardiopulmonary distress.  Chest: Clear to auscultation bilaterally.  CVS: S1, S2 no murmurs, no S3.Regular rate.  ABD: Soft non tender.   Ext: No edema noted   MS: Adequate ROM spine, shoulders, hips and knees.  Psych: Good eye contact, normal affect. Memory intact not anxious or depressed appearing.    Assessment & Plan  OBESITY NOS Wt Readings from Last 3 Encounters:  09/01/21 (!) 425 lb (192.8 kg)  06/06/21 (!) 430 lb (195 kg)  05/29/21 (!) 431 lb (195.5 kg)  he has lost 5 pounds since last visit  States that he has been doing a lot walking . He has been eating more baked foods .  Need to increase intake of whole food  consisting mainly vegetables and protein less carbohydrate drinking at least 64 ounces of water daily, importance of portion control also discussed.  Patient encouraged to engage in regular vigorous exercise at least 150 minutes weekly. Patient referred to medical weight management clinic   High blood pressure BP Readings from Last 3 Encounters:  09/01/21 (!) 143/95  06/06/21 (!) 146/105  05/29/21 (!) 146/93  Chronic condition uncontrolled on hydrochlorothiazide 25 mg daily Start losartan 25 mg daily, continue hydrochlorothiazide 25 mg daily goal is BP less than 130/80 Blood pressure monitor ordered patient encouraged to monitor blood pressure at home keep a log and bring to next follow-up appointment DASH diet advised, patient encouraged to engage in regular daily exercises at least 150 minutes weekly.  Hyperlipidemia associated with type 2 diabetes mellitus (HCC) Currently not on medication Avoid fatty fried foods Check lipid panel  Insomnia Chronic condition Currently on trazodone 100 mg daily Start trazodone 200 mg daily Patient encouraged to get sleep study test done.  Sleep study reordered

## 2021-09-01 NOTE — Assessment & Plan Note (Signed)
Chronic condition Currently on trazodone 100 mg daily Start trazodone 200 mg daily Patient encouraged to get sleep study test done.  Sleep study reordered

## 2021-09-01 NOTE — Assessment & Plan Note (Addendum)
Wt Readings from Last 3 Encounters:  09/01/21 (!) 425 lb (192.8 kg)  06/06/21 (!) 430 lb (195 kg)  05/29/21 (!) 431 lb (195.5 kg)  he has lost 5 pounds since last visit  States that he has been doing a lot walking . He has been eating more baked foods .  Need to increase intake of whole food consisting mainly vegetables and protein less carbohydrate drinking at least 64 ounces of water daily, importance of portion control also discussed.  Patient encouraged to engage in regular vigorous exercise at least 150 minutes weekly. Patient referred to medical weight management clinic

## 2021-09-05 ENCOUNTER — Other Ambulatory Visit (HOSPITAL_COMMUNITY)
Admission: RE | Admit: 2021-09-05 | Discharge: 2021-09-05 | Disposition: A | Discharge status: Home / Self Care | Payer: 59 | Source: Ambulatory Visit | Attending: Nurse Practitioner | Admitting: Nurse Practitioner

## 2021-09-05 DIAGNOSIS — E1169 Type 2 diabetes mellitus with other specified complication: Secondary | ICD-10-CM | POA: Insufficient documentation

## 2021-09-05 LAB — LIPID PANEL
Cholesterol: 203 mg/dL — ABNORMAL HIGH (ref 0–200)
HDL: 36 mg/dL — ABNORMAL LOW (ref >40)
LDL Cholesterol: 152 mg/dL — ABNORMAL HIGH (ref 0–99)
Total CHOL/HDL Ratio: 5.6 RATIO
Triglycerides: 75 mg/dL (ref <150)
VLDL: 15 mg/dL (ref 0–40)

## 2021-09-05 LAB — HEMOGLOBIN A1C
Hgb A1c MFr Bld: 5.8 % — ABNORMAL HIGH (ref 4.8–5.6)
Mean Plasma Glucose: 119.76 mg/dL

## 2021-09-05 LAB — BASIC METABOLIC PANEL
Anion gap: 5 (ref 5–15)
BUN: 13 mg/dL (ref 6–20)
CO2: 25 mmol/L (ref 22–32)
Calcium: 8.8 mg/dL — ABNORMAL LOW (ref 8.9–10.3)
Chloride: 110 mmol/L (ref 98–111)
Creatinine, Ser: 0.91 mg/dL (ref 0.61–1.24)
GFR, Estimated: >60 mL/min (ref >60)
Glucose, Bld: 106 mg/dL — ABNORMAL HIGH (ref 70–99)
Potassium: 3.9 mmol/L (ref 3.5–5.1)
Sodium: 140 mmol/L (ref 135–145)

## 2021-09-06 ENCOUNTER — Other Ambulatory Visit: Payer: Self-pay | Admitting: Nurse Practitioner

## 2021-09-06 DIAGNOSIS — I10 Essential (primary) hypertension: Secondary | ICD-10-CM

## 2021-09-06 DIAGNOSIS — E1169 Type 2 diabetes mellitus with other specified complication: Secondary | ICD-10-CM

## 2021-09-06 LAB — MICROALBUMIN, URINE: Microalb, Ur: <3 ug/mL — ABNORMAL HIGH

## 2021-09-06 MED ORDER — OZEMPIC (0.25 OR 0.5 MG/DOSE) 2 MG/3ML ~~LOC~~ SOPN: 0.2500 mg | PEN_INJECTOR | SUBCUTANEOUS | 0 refills | Status: DC

## 2021-09-06 MED ORDER — ATORVASTATIN CALCIUM 10 MG PO TABS: 10.0000 mg | ORAL_TABLET | Freq: Every day | ORAL | 3 refills | Status: DC

## 2021-09-06 NOTE — Progress Notes (Signed)
Pt should have BMP done in 2 weeks due to recently starting losartan

## 2021-09-06 NOTE — Progress Notes (Signed)
A1C is 5.8, pt should start taking ozempic 0.25mg  once weekly injection, this will also assist with weight loss.   Calcium level is low, pt should start taking OTC calcium supplement   LDL is not at goal of less than 70 Start atorvastatin 10mg  daily , avoid fatty fried foods.    The 10-year ASCVD risk score (Arnett DK, et al., 2019) is: 16.1%   Values used to calculate the score:     Age: 46 years     Sex: Male     Is Non-Hispanic African American: Yes     Diabetic: Yes     Tobacco smoker: No     Systolic Blood Pressure: 143 mmHg     Is BP treated: Yes     HDL Cholesterol: 36 mg/dL     Total Cholesterol: 203 mg/dL

## 2021-09-07 ENCOUNTER — Telehealth: Payer: Self-pay

## 2021-09-07 NOTE — Telephone Encounter (Signed)
Pt called into office wanting to know if ozempic can be changed over to a pill form. Advised to pt that ozempic only comes in an injection but I will forward to [facility] to see what she wants to do. Please advise

## 2021-09-08 ENCOUNTER — Other Ambulatory Visit: Payer: Self-pay | Admitting: Nurse Practitioner

## 2021-09-08 MED ORDER — RYBELSUS 7 MG PO TABS: 7.0000 mg | ORAL_TABLET | Freq: Every day | ORAL | 0 refills | Status: DC

## 2021-09-08 MED ORDER — RYBELSUS 3 MG PO TABS: 3.0000 mg | ORAL_TABLET | Freq: Every day | ORAL | 0 refills | Status: DC

## 2021-09-08 NOTE — Telephone Encounter (Signed)
Spoke with pt advised of Fola's message pt verbalized understanding °

## 2021-09-08 NOTE — Telephone Encounter (Signed)
Pt states that he is not good at doing his own injection, he does not want to do an injection. Please advise

## 2021-09-08 NOTE — Telephone Encounter (Signed)
Spoke with pt he states that the ozempic is ready for pick up at the pharmacy he just hasn't gotten it yet

## 2021-09-11 ENCOUNTER — Telehealth: Payer: Self-pay

## 2021-09-11 NOTE — Telephone Encounter (Signed)
Patient called need med refill Semaglutide (RYBELSUS)  Can not afford this medicine too expensive over $1000.00 . Please give patient a call back on what the next step.

## 2021-09-11 NOTE — Telephone Encounter (Signed)
Please advise 

## 2021-09-12 ENCOUNTER — Telehealth: Payer: Self-pay | Admitting: Nurse Practitioner

## 2021-09-12 NOTE — Telephone Encounter (Signed)
Called pt back no call unable to be completed will contact him once we get a response

## 2021-09-12 NOTE — Telephone Encounter (Signed)
Patient returning call needs to know will he be able to get his meds.

## 2021-09-12 NOTE — Telephone Encounter (Signed)
Pt called stating that he is wanting to know if we have received the authorizations for his medications?? He does not know the names of them.

## 2021-09-13 NOTE — Telephone Encounter (Signed)
Pt called back advised will contact him when we hear something pt verbalized understanding

## 2021-09-18 ENCOUNTER — Other Ambulatory Visit: Payer: Self-pay

## 2021-09-18 ENCOUNTER — Emergency Department (HOSPITAL_COMMUNITY)
Admission: EM | Admit: 2021-09-18 | Discharge: 2021-09-18 | Disposition: A | Discharge status: Home / Self Care | Payer: 59 | Attending: Emergency Medicine | Admitting: Emergency Medicine

## 2021-09-18 ENCOUNTER — Encounter (HOSPITAL_COMMUNITY): Payer: Self-pay | Admitting: Emergency Medicine

## 2021-09-18 DIAGNOSIS — Z79899 Other long term (current) drug therapy: Secondary | ICD-10-CM | POA: Diagnosis not present

## 2021-09-18 DIAGNOSIS — L304 Erythema intertrigo: Secondary | ICD-10-CM | POA: Diagnosis not present

## 2021-09-18 DIAGNOSIS — I10 Essential (primary) hypertension: Secondary | ICD-10-CM | POA: Diagnosis not present

## 2021-09-18 DIAGNOSIS — B372 Candidiasis of skin and nail: Secondary | ICD-10-CM

## 2021-09-18 DIAGNOSIS — E119 Type 2 diabetes mellitus without complications: Secondary | ICD-10-CM | POA: Insufficient documentation

## 2021-09-18 DIAGNOSIS — R21 Rash and other nonspecific skin eruption: Secondary | ICD-10-CM | POA: Diagnosis present

## 2021-09-18 MED ORDER — CLOTRIMAZOLE 1 % EX CREA: TOPICAL_CREAM | CUTANEOUS | 0 refills | Status: DC

## 2021-09-18 NOTE — ED Provider Notes (Signed)
[facility] EMERGENCY DEPARTMENT Provider Note   CSN: [id number] Arrival date & time: 09/18/21  1830     History  Chief Complaint  Patient presents with   Rash    Bobby Bridges is a 46 y.o. male.  Bobby Bridges is a 46 y.o. male with a history of hypertension, diabetes, obesity, seizures, who presents to the emergency department for evaluation of rash to bilateral axilla.  Patient reports it has been there for about 1 week.  He reports it is often itchy and he has noticed an associated odor and the area is tender and uncomfortable.  He denies any recent changes to deodorant.  No other changes to household products.  He has not noted a similar rash elsewhere.  The history is provided by the patient.  Rash      Home Medications Prior to Admission medications   Medication Sig Start Date End Date Taking? Authorizing Provider  clotrimazole (LOTRIMIN) 1 % cream Apply to affected area 2 times daily 09/18/21  Yes Dartha Lodge, PA-C  acetaminophen (TYLENOL) 500 MG tablet Take 500 mg by mouth every 6 (six) hours as needed for mild pain or moderate pain.    [provider]  albuterol (VENTOLIN HFA) 108 (90 Base) MCG/ACT inhaler Inhale 2 puffs into the lungs every 6 (six) hours as needed for wheezing or shortness of breath. 05/29/21   Donell Beers, FNP  atorvastatin (LIPITOR) 10 MG tablet Take 1 tablet (10 mg total) by mouth daily. 09/06/21   Paseda, Baird Kay, FNP  hydrochlorothiazide (HYDRODIURIL) 25 MG tablet Take 1 tablet (25 mg total) by mouth daily. 05/29/21   Paseda, Baird Kay, FNP  losartan (COZAAR) 25 MG tablet Take 1 tablet (25 mg total) by mouth daily. 09/01/21   Paseda, Baird Kay, FNP  Semaglutide (RYBELSUS) 3 MG TABS Take 3 mg by mouth daily. 09/08/21   Paseda, Baird Kay, FNP  Semaglutide (RYBELSUS) 7 MG TABS Take 7 mg by mouth daily. 10/08/21   Donell Beers, FNP  traZODone (DESYREL) 100 MG tablet TAKE 2 TABLET BY MOUTH AT BEDTIME FOR SLEEP  (200 mg) 09/01/21   Paseda, Baird Kay, FNP  UNABLE TO FIND Blood pressure cuff DX: I10 09/01/21   Paseda, Baird Kay, FNP      Allergies    Patient has no known allergies.    Review of Systems   Review of Systems  Skin:  Positive for rash.    Physical Exam Updated Vital Signs BP (!) 154/106 (BP Location: Right Wrist)   Pulse 86   Temp 97.6 F (36.4 C) (Oral)   Resp 18   Ht 6\' 1"  (1.854 m)   Wt (!) 190.5 kg   SpO2 97%   BMI 55.41 kg/m  Physical Exam Vitals and nursing note reviewed.  Constitutional:      General: He is not in acute distress.    Appearance: Normal appearance. He is well-developed. He is obese. He is not ill-appearing or diaphoretic.  HENT:     Head: Normocephalic and atraumatic.  Eyes:     General:        Right eye: No discharge.        Left eye: No discharge.  Pulmonary:     Effort: Pulmonary effort is normal. No respiratory distress.  Skin:    Comments: Raised, beefy red rash noted to bilateral axilla, no vesicles, pustules or petechiae, mildly tender to palpation.  Neurological:     Mental Status: He is alert  and oriented to person, place, and time.     Coordination: Coordination normal.  Psychiatric:        Mood and Affect: Mood normal.        Behavior: Behavior normal.     ED Results / Procedures / Treatments   Labs (all labs ordered are listed, but only abnormal results are displayed) Labs Reviewed - No data to display  EKG None  Radiology No results found.  Procedures Procedures    Medications Ordered in ED Medications - No data to display  ED Course/ Medical Decision Making/ A&P                           Medical Decision Making  Patient presents with rash to bilateral axilla.  On exam rash is consistent with candidal intertrigo.  Patient recently diagnosed as type II diabetic, also yeast likely predisposing him to this condition.  Encourage patient to keep area cool and dry.  Will prescribe clotrimazole and have patient  follow-up closely with his primary care provider.        Final Clinical Impression(s) / ED Diagnoses Final diagnoses:  Candidal intertrigo    Rx / DC Orders ED Discharge Orders          Ordered    clotrimazole (LOTRIMIN) 1 % cream        09/18/21 1915              Legrand Rams 09/18/21 1923    Cheryll Cockayne, MD 09/25/21 1652

## 2021-09-18 NOTE — ED Triage Notes (Signed)
Pt has rash to bilateral armpits causing discomfort.

## 2021-09-18 NOTE — Discharge Instructions (Addendum)
The rash under your underarms is a yeast infection.  This can occur in skin folds especially in areas that remain moist.  Diabetes can put you at increased risk for this.  Try and keep the area cool and dry.  You can use baby powder in the area to try and reduce moisture.  Apply clotrimazole cream to the area twice daily.  Follow-up with your primary care doctor if not improving.

## 2021-09-19 ENCOUNTER — Telehealth: Payer: Self-pay

## 2021-09-19 NOTE — Telephone Encounter (Signed)
Patient called needs for nurse to return call to discuss meds when to take. call back # [phone]

## 2021-09-21 NOTE — Telephone Encounter (Signed)
Spoke with pt we are still waiting on authorization

## 2021-09-22 ENCOUNTER — Telehealth: Payer: Self-pay | Admitting: Nurse Practitioner

## 2021-09-22 ENCOUNTER — Other Ambulatory Visit: Payer: Self-pay

## 2021-09-22 ENCOUNTER — Telehealth: Payer: Self-pay

## 2021-09-22 DIAGNOSIS — G4709 Other insomnia: Secondary | ICD-10-CM

## 2021-09-22 NOTE — Telephone Encounter (Signed)
Spoke with pt advised that [facility] has been trying to reach him to schedule sleep study. Provider number to reach them. Pt verbalized understanding

## 2021-09-22 NOTE — Telephone Encounter (Signed)
Spoke with pt he wants to know if he can do the sleep study in person and not at home please advise

## 2021-09-22 NOTE — Telephone Encounter (Signed)
Pt called wanting to speak to nurse with questions in regards to the sleep study that was ordered for him??

## 2021-09-22 NOTE — Telephone Encounter (Signed)
Referral placed pt aware 

## 2021-09-25 ENCOUNTER — Telehealth: Payer: Self-pay | Admitting: Nurse Practitioner

## 2021-09-25 NOTE — Telephone Encounter (Signed)
Pt called stating that they Rybelsus 7mg  is too expensive & he is wanting to know if there is any assistance he can get with this to make it less expensive?

## 2021-09-26 ENCOUNTER — Other Ambulatory Visit: Payer: Self-pay

## 2021-09-26 ENCOUNTER — Telehealth: Payer: Self-pay | Admitting: Nurse Practitioner

## 2021-09-26 DIAGNOSIS — E1169 Type 2 diabetes mellitus with other specified complication: Secondary | ICD-10-CM

## 2021-09-26 NOTE — Telephone Encounter (Signed)
[facility] see other tele msg

## 2021-09-26 NOTE — Telephone Encounter (Signed)
Patient called in regard to  Semaglutide (RYBELSUS) 3 MG TABS  Semaglutide (RYBELSUS) 7 MG TABS   Patient states that med is too expensive, would like a cheaper alternative.

## 2021-09-26 NOTE — Telephone Encounter (Signed)
Pt called asking for a nurse to give him a call back, did not give a reason

## 2021-09-26 NOTE — Telephone Encounter (Signed)
[facility] see other tele msg 

## 2021-09-27 ENCOUNTER — Telehealth: Payer: Self-pay | Admitting: Nurse Practitioner

## 2021-09-27 NOTE — Telephone Encounter (Signed)
Patient called in regard to Semaglutide (RYBELSUS) 3 MG TABS   Insurance did not cover med in full , med is still $900.  Patient wants a cll back in regard.

## 2021-09-28 NOTE — Telephone Encounter (Signed)
Spoke with pt advised that we are seeing what we can do on our end. Referral has been placed. Advised pt to check website for copay cards as well

## 2021-10-05 ENCOUNTER — Telehealth: Payer: Self-pay | Admitting: Nurse Practitioner

## 2021-10-05 NOTE — Telephone Encounter (Signed)
Spoke with pt per referral due to insurance they aren't able to assist him. Advised to pt that I will fill out the pt assistance form and see if we can get him approved pt will come by tomorrow to sign paper

## 2021-10-05 NOTE — Telephone Encounter (Signed)
Pt called asking to speak with nurse. Can you please call him when available?

## 2021-10-27 ENCOUNTER — Encounter: Payer: 59 | Admitting: Nurse Practitioner

## 2021-11-24 ENCOUNTER — Encounter: Payer: 59 | Admitting: Nurse Practitioner

## 2021-12-14 ENCOUNTER — Ambulatory Visit (INDEPENDENT_AMBULATORY_CARE_PROVIDER_SITE_OTHER): Payer: 59 | Admitting: Nurse Practitioner

## 2021-12-14 ENCOUNTER — Encounter: Payer: Self-pay | Admitting: *Deleted

## 2021-12-14 ENCOUNTER — Encounter: Payer: Self-pay | Admitting: Nurse Practitioner

## 2021-12-14 VITALS — BP 136/96 | HR 98 | Ht 70.0 in | Wt >= 6400 oz

## 2021-12-14 DIAGNOSIS — Z23 Encounter for immunization: Secondary | ICD-10-CM | POA: Diagnosis not present

## 2021-12-14 DIAGNOSIS — E785 Hyperlipidemia, unspecified: Secondary | ICD-10-CM

## 2021-12-14 DIAGNOSIS — I10 Essential (primary) hypertension: Secondary | ICD-10-CM | POA: Diagnosis not present

## 2021-12-14 DIAGNOSIS — N528 Other male erectile dysfunction: Secondary | ICD-10-CM | POA: Diagnosis not present

## 2021-12-14 DIAGNOSIS — G4700 Insomnia, unspecified: Secondary | ICD-10-CM

## 2021-12-14 DIAGNOSIS — Z1211 Encounter for screening for malignant neoplasm of colon: Secondary | ICD-10-CM

## 2021-12-14 DIAGNOSIS — Z0001 Encounter for general adult medical examination with abnormal findings: Secondary | ICD-10-CM

## 2021-12-14 DIAGNOSIS — Z0000 Encounter for general adult medical examination without abnormal findings: Secondary | ICD-10-CM

## 2021-12-14 DIAGNOSIS — G4709 Other insomnia: Secondary | ICD-10-CM | POA: Diagnosis not present

## 2021-12-14 DIAGNOSIS — E1169 Type 2 diabetes mellitus with other specified complication: Secondary | ICD-10-CM

## 2021-12-14 MED ORDER — SILDENAFIL CITRATE 50 MG PO TABS: 50.0000 mg | ORAL_TABLET | Freq: Every day | ORAL | 0 refills | Status: DC | PRN

## 2021-12-14 MED ORDER — HYDROXYZINE PAMOATE 50 MG PO CAPS: 50.0000 mg | ORAL_CAPSULE | Freq: Every evening | ORAL | 1 refills | Status: DC | PRN

## 2021-12-14 MED ORDER — HYDROCHLOROTHIAZIDE 25 MG PO TABS: 25.0000 mg | ORAL_TABLET | Freq: Every day | ORAL | 1 refills | Status: DC

## 2021-12-14 NOTE — Progress Notes (Addendum)
Complete physical exam  Patient: Bobby Bridges   DOB: 12-15-1975   46 y.o. Male  MRN: [id number]  Subjective:    Chief Complaint  Patient presents with   Annual Exam    cpe    Bobby Bridges is a 45 y.o. male with past medical history of type 2 diabetes, hyperlipidemia, morbid obesity, sleep apnea, insomnia, who presents today for a complete physical exam.     Insomnia .He continues to have problems with insomnia , currently on trazodone 200 mg daily at bedtime ,has history of sleep apnea, does not wear CPAP machine, trazodone is not helping. He was referred for home sleep study but he could not afford the co pay.   Erectile dysfunction.  Patient complains of erectile dysfunction states that he has used medications in the past he would like to go back on medication.  Patient denies urinary hesitancy.   Hypertension.  Patient stated that he has been taking losartan 5 mg daily unsure if he has been taking hydrochlorothiazide.  Patient denies dizziness, syncope.   For colon cancer screening referral sent for colonoscopy due for diabetic eye exam referral sent for diabetic eye exam  Not sure if the patient has been taking his medications as ordered patient encouraged to bring all medications with him to his next appointment   Most recent fall risk assessment:    12/14/2021   12:06 PM  Fall Risk   Falls in the past year? 0  Number falls in past yr: 0  Injury with Fall? 0  Risk for fall due to : No Fall Risks  Follow up Falls evaluation completed     Most recent depression screenings:    12/14/2021   12:06 PM 09/01/2021    2:52 PM  PHQ 2/9 Scores  PHQ - 2 Score 0 0        Patient Care Team: Donell Beers, FNP as PCP - General (Nurse Practitioner)   Outpatient Medications Prior to Visit  Medication Sig   albuterol (VENTOLIN HFA) 108 (90 Base) MCG/ACT inhaler Inhale 2 puffs into the lungs every 6 (six) hours as needed for wheezing or shortness of  breath.   losartan (COZAAR) 25 MG tablet Take 1 tablet (25 mg total) by mouth daily.   [DISCONTINUED] hydrochlorothiazide (HYDRODIURIL) 25 MG tablet Take 1 tablet (25 mg total) by mouth daily.   acetaminophen (TYLENOL) 500 MG tablet Take 500 mg by mouth every 6 (six) hours as needed for mild pain or moderate pain. (Patient not taking: Reported on 12/14/2021)   atorvastatin (LIPITOR) 10 MG tablet Take 1 tablet (10 mg total) by mouth daily. (Patient not taking: Reported on 12/14/2021)   clotrimazole (LOTRIMIN) 1 % cream Apply to affected area 2 times daily (Patient not taking: Reported on 12/14/2021)   Semaglutide (RYBELSUS) 3 MG TABS Take 3 mg by mouth daily. (Patient not taking: Reported on 12/14/2021)   Semaglutide (RYBELSUS) 7 MG TABS Take 7 mg by mouth daily. (Patient not taking: Reported on 12/14/2021)   traZODone (DESYREL) 100 MG tablet TAKE 2 TABLET BY MOUTH AT BEDTIME FOR SLEEP (200 mg) (Patient not taking: Reported on 12/14/2021)   UNABLE TO FIND Blood pressure cuff DX: I10 (Patient not taking: Reported on 12/14/2021)   No facility-administered medications prior to visit.    Review of Systems  Constitutional: Negative.  Negative for chills, fever and weight loss.  HENT: Negative.  Negative for ear pain, hearing loss and tinnitus.   Eyes: Negative.  Negative  for pain, discharge and redness.  Respiratory: Negative.  Negative for cough and hemoptysis.   Cardiovascular: Negative.  Negative for chest pain, palpitations and orthopnea.  Gastrointestinal:  Negative for heartburn, nausea and vomiting.  Genitourinary: Negative.  Negative for dysuria, flank pain and hematuria.  Musculoskeletal: Negative.  Negative for falls and joint pain.  Skin: Negative.  Negative for itching and rash.  Neurological: Negative.  Negative for seizures, loss of consciousness and weakness.  Endo/Heme/Allergies:  Negative for environmental allergies and polydipsia. Does not bruise/bleed easily.  Psychiatric/Behavioral:   Negative for memory loss. The patient has insomnia. The patient is not nervous/anxious.           Objective:     BP (!) 136/96   Pulse 98   Ht 5\' 10"  (1.778 m)   Wt (!) 425 lb (192.8 kg)   SpO2 94%   BMI 60.98 kg/m    Physical Exam Constitutional:      General: He is not in acute distress.    Appearance: He is obese. He is not ill-appearing, toxic-appearing or diaphoretic.  HENT:     Head: Normocephalic and atraumatic.     Right Ear: Tympanic membrane, ear canal and external ear normal.     Left Ear: Tympanic membrane, ear canal and external ear normal.     Nose: No congestion or rhinorrhea.     Mouth/Throat:     Mouth: Mucous membranes are moist.     Pharynx: Oropharynx is clear. No oropharyngeal exudate or posterior oropharyngeal erythema.  Eyes:     General: No scleral icterus.       Right eye: No discharge.        Left eye: No discharge.     Pupils: Pupils are equal, round, and reactive to light.     Comments: Has astigmatism  Neck:     Vascular: No carotid bruit.  Cardiovascular:     Rate and Rhythm: Normal rate and regular rhythm.     Pulses: Normal pulses.     Heart sounds: Normal heart sounds. No murmur heard.    No friction rub. No gallop.  Pulmonary:     Effort: Pulmonary effort is normal. No respiratory distress.     Breath sounds: Normal breath sounds. No stridor. No wheezing, rhonchi or rales.  Chest:     Chest wall: No tenderness.  Abdominal:     General: There is no distension.     Palpations: There is no mass.     Tenderness: There is no abdominal tenderness. There is no right CVA tenderness, left CVA tenderness, guarding or rebound.     Hernia: No hernia is present.  Musculoskeletal:        General: No swelling, tenderness, deformity or signs of injury. Normal range of motion.     Cervical back: Normal range of motion and neck supple. No rigidity or tenderness.     Right lower leg: No edema.     Left lower leg: No edema.  Lymphadenopathy:      Cervical: No cervical adenopathy.  Skin:    General: Skin is warm and dry.     Capillary Refill: Capillary refill takes less than 2 seconds.     Coloration: Skin is not jaundiced.     Findings: No bruising or lesion.  Neurological:     Mental Status: He is alert and oriented to person, place, and time.     Cranial Nerves: No cranial nerve deficit.     Sensory: No sensory deficit.  Motor: No weakness.     Coordination: Coordination normal.     Gait: Gait normal.     Deep Tendon Reflexes: Reflexes normal.  Psychiatric:        Mood and Affect: Mood normal.        Behavior: Behavior normal.        Thought Content: Thought content normal.        Judgment: Judgment normal.      No results found for any visits on 12/14/21.     Assessment & Plan:    Routine Health Maintenance and Physical Exam  Immunization History  Administered Date(s) Administered   Influenza Whole 04/22/2006, 02/03/2008   Influenza,inj,Quad PF,6+ Mos 12/14/2021   Tdap 01/31/2020    Health Maintenance  Topic Date Due   COVID-19 Vaccine (1) Never done   OPHTHALMOLOGY EXAM  Never done   COLONOSCOPY (Pts 45-64yrs Insurance coverage will need to be confirmed)  Never done   Hepatitis C Screening  12/15/2022 (Originally 10/26/1994)   HIV Screening  12/15/2022 (Originally 10/26/1991)   HEMOGLOBIN A1C  03/08/2022   FOOT EXAM  12/15/2022   TETANUS/TDAP  01/30/2030   INFLUENZA VACCINE  Completed   HPV VACCINES  Aged Out    Discussed health benefits of physical activity, and encouraged him to engage in regular exercise appropriate for his age and condition.  Problem List Items Addressed This Visit       Cardiovascular and Mediastinum   High blood pressure    BP Readings from Last 3 Encounters:  12/14/21 (!) 136/96  09/18/21 (!) 154/106  09/01/21 (!) 143/95  Chronic condition uncontrolled Currently on losartan 25 mg daily,  Not sure if  he has been taking hydrochlorothiazide 25 mg daily. Get blood  pressure under control discussed with patient DASH diet advised Engage in regular moderate to vigorous exercises at least 150 minutes weekly      Relevant Medications   hydrochlorothiazide (HYDRODIURIL) 25 MG tablet   hydrOXYzine (VISTARIL) 50 MG capsule   sildenafil (VIAGRA) 50 MG tablet     Endocrine   Hyperlipidemia associated with type 2 diabetes mellitus (HCC)    Lab Results  Component Value Date   CHOL 203 (H) 09/05/2021   HDL 36 (L) 09/05/2021   LDLCALC 152 (H) 09/05/2021   TRIG 75 09/05/2021   CHOLHDL 5.6 09/05/2021  Currently on atorvastatin 10 mg daily , patient reports that he  is taking this medication daily  Check lipid panel Avoid fatty fried foods       Relevant Medications   hydrochlorothiazide (HYDRODIURIL) 25 MG tablet   sildenafil (VIAGRA) 50 MG tablet   Type 2 diabetes mellitus with other specified complication (HCC)    Lab Results  Component Value Date   HGBA1C 5.8 (H) 09/05/2021  Condition currently diet controlled Not able to get rybelsus due to cost of copay Avoid sugar sweets soda Engage in regular moderate to vigorous daily exercises at least 150 minutes weekly. will continue to control  diabetes with diet at this time Bittick foot exam completed      Relevant Orders   Ambulatory referral to Ophthalmology     Other   Erectile dysfunction    Patient is willing to try Viagra again Viagra 50 mg daily as needed ordered Will refer to urology if he has side effects from this medication      Relevant Medications   sildenafil (VIAGRA) 50 MG tablet   Insomnia disorder    Chronic condition uncontrolled on trazodone 200  mg daily Start hydroxyzine 50 mg dailyPRN at bedtime He has not been able to get sleep study done due to cost of co-pay Sleep apnea most likely contributing to his insomnia      Relevant Medications   hydrOXYzine (VISTARIL) 50 MG capsule   Annual physical exam - Primary    Annual exam as documented.  Counseling done include  healthy lifestyle involving committing to 150 minutes of exercise per week, heart healthy diet, and attaining healthy weight. The importance of adequate sleep also discussed.  Regular use of seat belt and home safety were also discussed . Changes in health habits are decided on by patient with goals and time frames set for achieving them. Immunization and cancer screening  needs are specifically addressed at this visit.        Relevant Orders   Basic Metabolic Panel (BMET)   Lipid Profile   TSH   Vitamin D (25 hydroxy)   CBC with Differential   Other Visit Diagnoses     Need for immunization against influenza       Relevant Orders   Flu Vaccine QUAD 4mo+IM (Fluarix, Fluzone & Alfiuria Quad PF) (Completed)   Screening for colon cancer       Relevant Orders   Ambulatory referral to Gastroenterology      Return in about 4 weeks (around 01/11/2022) for HTN.     Donell Beers, FNP

## 2021-12-14 NOTE — Assessment & Plan Note (Signed)
Annual exam as documented.  ?Counseling done include healthy lifestyle involving committing to 150 minutes of exercise per week, heart healthy diet, and attaining healthy weight. The importance of adequate sleep also discussed.  ?Regular use of seat belt and home safety were also discussed . ?Changes in health habits are decided on by patient with goals and time frames set for achieving them. ?Immunization and cancer screening  needs are specifically addressed at this visit.   ?

## 2021-12-14 NOTE — Patient Instructions (Addendum)
Please take hydrochlorothiazide 25mg  daily, losartan 25mg  daily for your high blood pressure. Monitor blood pressure at home, goal for your blood pressure is less than 130/80.    Please take hydroxyzine 50mg  daily as needed at bedtime for insomnia. Do not take this medication with trazodone.   Please take viagra 50 mg once daily as needed 1 hour before sexual activity     It is important that you exercise regularly at least 30 minutes 5 times a week.  Think about what you will eat, plan ahead. Choose " clean, green, fresh or frozen" over canned, processed or packaged foods which are more sugary, salty and fatty. 70 to 75% of food eaten should be vegetables and fruit. Three meals at set times with snacks allowed between meals, but they must be fruit or vegetables. Aim to eat over a 12 hour period , example 7 am to 7 pm, and STOP after  your last meal of the day. Drink water,generally about 64 ounces per day, no other drink is as healthy. Fruit juice is best enjoyed in a healthy way, by EATING the fruit.  Thanks for choosing [facility], we consider it a privelige to serve you.

## 2021-12-14 NOTE — Assessment & Plan Note (Signed)
Lab Results  Component Value Date   CHOL 203 (H) 09/05/2021   HDL 36 (L) 09/05/2021   LDLCALC 152 (H) 09/05/2021   TRIG 75 09/05/2021   CHOLHDL 5.6 09/05/2021  Currently on atorvastatin 10 mg daily , patient reports that he  is taking this medication daily  Check lipid panel Avoid fatty fried foods

## 2021-12-14 NOTE — Assessment & Plan Note (Signed)
Chronic condition uncontrolled on trazodone 200 mg daily Start hydroxyzine 50 mg dailyPRN at bedtime He has not been able to get sleep study done due to cost of co-pay Sleep apnea most likely contributing to his insomnia

## 2021-12-14 NOTE — Assessment & Plan Note (Signed)
BP Readings from Last 3 Encounters:  12/14/21 (!) 136/96  09/18/21 (!) 154/106  09/01/21 (!) 143/95  Chronic condition uncontrolled Currently on losartan 25 mg daily,  Not sure if  he has been taking hydrochlorothiazide 25 mg daily. Get blood pressure under control discussed with patient DASH diet advised Engage in regular moderate to vigorous exercises at least 150 minutes weekly

## 2021-12-14 NOTE — Assessment & Plan Note (Signed)
Patient is willing to try Viagra again Viagra 50 mg daily as needed ordered Will refer to urology if he has side effects from this medication

## 2021-12-14 NOTE — Assessment & Plan Note (Signed)
Lab Results  Component Value Date   HGBA1C 5.8 (H) 09/05/2021  Condition currently diet controlled Not able to get rybelsus due to cost of copay Avoid sugar sweets soda Engage in regular moderate to vigorous daily exercises at least 150 minutes weekly. will continue to control  diabetes with diet at this time Bittick foot exam completed

## 2022-01-04 ENCOUNTER — Encounter: Payer: Self-pay | Admitting: *Deleted

## 2022-01-04 NOTE — Patient Instructions (Signed)
  Procedure: colonoscopy  Estimated body mass index is 60.98 kg/m as calculated from the following:   Height as of this encounter: 5\' 10"  (1.778 m).   Weight as of this encounter: 425 lb (192.8 kg).   Have you had a colonoscopy before?  no  Do you have family history of colon cancer  no  Do you have a family history of polyps? no  Previous colonoscopy with polyps removed? no  Do you have a history colorectal cancer?   no  Are you diabetic?  Yes type 2  Do you have a prosthetic or mechanical heart valve? no  Do you have a pacemaker/defibrillator?   no  Have you had endocarditis/atrial fibrillation?  no  Do you use supplemental oxygen/CPAP?  no  Have you had joint replacement within the last 12 months?  no  Do you tend to be constipated or have to use laxatives?  no   Do you have history of alcohol use? If yes, how much and how often.  no  Do you have history or are you using drugs? If yes, what do are you  using?  no  Have you ever had a stroke/heart attack?  no  Have you ever had a heart or other vascular stent placed,?no  Do you take weight loss medication? no   Do you take any blood-thinning medications such as: (Plavix, aspirin, Coumadin, Aggrenox, Brilinta, Xarelto, Eliquis, Pradaxa, Savaysa or Effient) no  If yes we need the name, milligram, dosage and who is prescribing doctor:               Current Outpatient Medications  Medication Sig Dispense Refill   acetaminophen (TYLENOL) 500 MG tablet Take 500 mg by mouth every 6 (six) hours as needed for mild pain or moderate pain. (Patient not taking: Reported on 12/14/2021)     albuterol (VENTOLIN HFA) 108 (90 Base) MCG/ACT inhaler Inhale 2 puffs into the lungs every 6 (six) hours as needed for wheezing or shortness of breath. 8 g 0   atorvastatin (LIPITOR) 10 MG tablet Take 1 tablet (10 mg total) by mouth daily. (Patient not taking: Reported on 12/14/2021) 90 tablet 3   clotrimazole (LOTRIMIN) 1 % cream Apply to  affected area 2 times daily (Patient not taking: Reported on 12/14/2021) 15 g 0   hydrochlorothiazide (HYDRODIURIL) 25 MG tablet Take 1 tablet (25 mg total) by mouth daily. 90 tablet 1   hydrOXYzine (VISTARIL) 50 MG capsule Take 1 capsule (50 mg total) by mouth at bedtime as needed. 30 capsule 1   losartan (COZAAR) 25 MG tablet Take 1 tablet (25 mg total) by mouth daily. 90 tablet 1   Semaglutide (RYBELSUS) 3 MG TABS Take 3 mg by mouth daily. (Patient not taking: Reported on 12/14/2021) 30 tablet 0   Semaglutide (RYBELSUS) 7 MG TABS Take 7 mg by mouth daily. (Patient not taking: Reported on 12/14/2021) 30 tablet 0   sildenafil (VIAGRA) 50 MG tablet Take 1 tablet (50 mg total) by mouth daily as needed for erectile dysfunction. 10 tablet 0   traZODone (DESYREL) 100 MG tablet TAKE 2 TABLET BY MOUTH AT BEDTIME FOR SLEEP (200 mg) (Patient not taking: Reported on 12/14/2021) 60 tablet 1   UNABLE TO FIND Blood pressure cuff DX: I10 (Patient not taking: Reported on 12/14/2021) 1 each 0   No current facility-administered medications for this visit.    No Known Allergies

## 2022-01-12 ENCOUNTER — Ambulatory Visit: Payer: 59 | Admitting: Internal Medicine

## 2022-01-15 NOTE — Progress Notes (Signed)
Overall, ok to schedule. ASA 3 due to BMI.   We need to verify diabetes medications patient is taking. Per this list, he is not taking Rylebsus. Please verify this is correct and ask if he is taking anything for his diabetes as I will need to make adjustments to his medications prior to procedure.

## 2022-01-16 NOTE — Progress Notes (Signed)
Called pt, no accepting calls at this time.

## 2022-01-19 ENCOUNTER — Ambulatory Visit (INDEPENDENT_AMBULATORY_CARE_PROVIDER_SITE_OTHER): Payer: 59 | Admitting: Internal Medicine

## 2022-01-19 ENCOUNTER — Encounter: Payer: Self-pay | Admitting: Internal Medicine

## 2022-01-19 VITALS — BP 133/89 | HR 111 | Ht 70.0 in | Wt >= 6400 oz

## 2022-01-19 DIAGNOSIS — I10 Essential (primary) hypertension: Secondary | ICD-10-CM | POA: Diagnosis not present

## 2022-01-19 DIAGNOSIS — G4730 Sleep apnea, unspecified: Secondary | ICD-10-CM | POA: Diagnosis not present

## 2022-01-19 DIAGNOSIS — Z1212 Encounter for screening for malignant neoplasm of rectum: Secondary | ICD-10-CM

## 2022-01-19 DIAGNOSIS — Z1211 Encounter for screening for malignant neoplasm of colon: Secondary | ICD-10-CM

## 2022-01-19 DIAGNOSIS — E1169 Type 2 diabetes mellitus with other specified complication: Secondary | ICD-10-CM | POA: Diagnosis not present

## 2022-01-19 DIAGNOSIS — Z9189 Other specified personal risk factors, not elsewhere classified: Secondary | ICD-10-CM | POA: Diagnosis not present

## 2022-01-19 MED ORDER — AMLODIPINE BESYLATE 5 MG PO TABS: 5.0000 mg | ORAL_TABLET | Freq: Every day | ORAL | 0 refills | Status: DC

## 2022-01-19 NOTE — Progress Notes (Unsigned)
Established Patient Office Visit  Subjective   Patient ID: Bobby Bridges, male    DOB: 1975-11-05  Age: 46 y.o. MRN: [id number]  Chief Complaint  Patient presents with   Follow-up   [name] returns to care today.  He is a 46 year old male with a past medical history significant for HTN, OSA, asthma, GERD, T2DM, HLD, and depression.  He was last seen at [facility] on 9/7 by Edwin Dada, NP for routine follow-up.  His blood pressure was elevated at that time but it was unclear if he was taking his prescribed medications.  4-week follow-up was arranged.  There have been no acute interval events.  Today [name] states that he feels well.  He has no acute concerns.  He has been taking losartan and hydrochlorothiazide as prescribed.  Chronic medical conditions and outstanding preventative healthcare maintenance items discussed today are individually addressed in A/P below.  Past Medical History:  Diagnosis Date   Anxiety    Asthma    Chronic back pain    Depression    GERD (gastroesophageal reflux disease)    Hearing loss in left ear    Hypertension    Seizures (HCC)    outgrew by age 69   Sleep apnea sleep apnea   History reviewed. No pertinent surgical history. Social History   Tobacco Use   Smoking status: Former    Packs/day: 1.00    Years: 2.00    Total pack years: 2.00    Types: Cigarettes    Quit date: 04/08/2012    Years since quitting: 9.8   Smokeless tobacco: Never  Vaping Use   Vaping Use: Never used  Substance Use Topics   Alcohol use: No   Drug use: No   Family History  Problem Relation Age of Onset   Cancer Mother    Hypertension Mother    Cancer Father    Hypertension Father    Heart disease Other        "before 45 years old"   No Known Allergies  Review of Systems  Constitutional:  Negative for chills and fever.  HENT:  Negative for sore throat.   Respiratory:  Negative for cough and shortness of breath.   Cardiovascular:   Negative for chest pain, palpitations and leg swelling.  Gastrointestinal:  Negative for abdominal pain, blood in stool, constipation, diarrhea, nausea and vomiting.  Genitourinary:  Negative for dysuria and hematuria.  Musculoskeletal:  Negative for myalgias.  Skin:  Negative for itching and rash.  Neurological:  Negative for dizziness and headaches.  Psychiatric/Behavioral:  Negative for depression and suicidal ideas.      Objective:     BP 133/89   Pulse (!) 111   Ht 5\' 10"  (1.778 m)   Wt (!) 421 lb 6.4 oz (191.1 kg)   SpO2 94%   BMI 60.46 kg/m  BP Readings from Last 3 Encounters:  01/19/22 133/89  12/14/21 (!) 136/96  09/18/21 (!) 154/106      Physical Exam Vitals reviewed.  Constitutional:      General: He is not in acute distress.    Appearance: Normal appearance. He is obese. He is not ill-appearing.  HENT:     Head: Normocephalic and atraumatic.     Nose: Nose normal. No congestion or rhinorrhea.     Mouth/Throat:     Mouth: Mucous membranes are moist.     Pharynx: Oropharynx is clear.  Eyes:     General: No scleral icterus.  Conjunctiva/sclera: Conjunctivae normal.     Pupils: Pupils are equal, round, and reactive to light.  Cardiovascular:     Rate and Rhythm: Normal rate and regular rhythm.     Pulses: Normal pulses.     Heart sounds: Normal heart sounds. No murmur heard. Pulmonary:     Effort: Pulmonary effort is normal.     Breath sounds: Normal breath sounds. No wheezing, rhonchi or rales.  Abdominal:     General: Abdomen is flat. Bowel sounds are normal. There is no distension.     Palpations: Abdomen is soft.     Tenderness: There is no abdominal tenderness.  Musculoskeletal:        General: No swelling or deformity. Normal range of motion.     Cervical back: Normal range of motion.  Skin:    General: Skin is warm and dry.     Capillary Refill: Capillary refill takes less than 2 seconds.  Neurological:     General: No focal deficit present.      Mental Status: He is alert and oriented to person, place, and time.     Motor: No weakness.  Psychiatric:        Mood and Affect: Mood normal.        Behavior: Behavior normal.        Thought Content: Thought content normal.    Last CBC Lab Results  Component Value Date   WBC 8.4 06/27/2019   HGB 14.0 06/27/2019   HCT 44.7 06/27/2019   MCV 84.7 06/27/2019   MCH 26.5 06/27/2019   RDW 13.9 06/27/2019   PLT 285 06/27/2019   Last metabolic panel Lab Results  Component Value Date   GLUCOSE 106 (H) 09/05/2021   NA 140 09/05/2021   K 3.9 09/05/2021   CL 110 09/05/2021   CO2 25 09/05/2021   BUN 13 09/05/2021   CREATININE 0.91 09/05/2021   GFRNONAA >60 09/05/2021   CALCIUM 8.8 (L) 09/05/2021   PROT 7.3 05/29/2021   ALBUMIN 4.1 05/29/2021   LABGLOB 3.2 05/29/2021   AGRATIO 1.3 05/29/2021   BILITOT 0.2 05/29/2021   ALKPHOS 111 05/29/2021   AST 24 05/29/2021   ALT 29 05/29/2021   ANIONGAP 5 09/05/2021   Last lipids Lab Results  Component Value Date   CHOL 203 (H) 09/05/2021   HDL 36 (L) 09/05/2021   LDLCALC 152 (H) 09/05/2021   TRIG 75 09/05/2021   CHOLHDL 5.6 09/05/2021   Last hemoglobin A1c Lab Results  Component Value Date   HGBA1C 5.8 (H) 09/05/2021   Last thyroid functions Lab Results  Component Value Date   TSH 3.540 05/08/2006    The 10-year ASCVD risk score (Arnett DK, et al., 2019) is: 14.9%    Assessment & Plan:   Problem List Items Addressed This Visit       Hypertension    Returning to care today for BP check.  BP today 133/89.  He is currently prescribed losartan 25 mg daily and HCTZ 25 mg daily.  He is not currently checking his blood pressure at home. -Add amlodipine 5 mg daily today -Follow-up in 4 weeks for BP check      Sleep apnea    Remote history of OSA, but he is not currently wearing CPAP and does not recall being told that he has sleep apnea.  He certainly possesses multiple risk factors for OSA.  Multiple home sleep test  have been ordered previously, but he has not followed through with testing. -Formal  PSG ordered today      Type 2 diabetes mellitus with other specified complication [facility])    He has previously been prescribed Rybelsus for management of both diabetes and morbid obesity, but states that he has not been able to afford the medication. -We will see if there are any payer assistance programs available to help with affording Rybelsus      Screening for colorectal cancer    Cologuard ordered today      Return in about 4 weeks (around 02/16/2022).    Johnette Abraham, MD

## 2022-01-19 NOTE — Patient Instructions (Signed)
It was a pleasure to see you today.  Thank you for giving [place] the opportunity to be involved in your care.  Below is a brief recap of your visit and next steps.  We will plan to see you again in 4 weeks.  Summary I have added amlodipine 5 mg daily for blood pressure I have also ordered a sleep study to assess for sleep apnea Follow up in 4 weeks We will see if there are any payer assistance programs for Rybelsus

## 2022-01-22 ENCOUNTER — Encounter: Payer: Self-pay | Admitting: *Deleted

## 2022-01-22 NOTE — Progress Notes (Signed)
Called pt, received message not accepting calls. Letter mailed to call back

## 2022-01-24 DIAGNOSIS — Z1211 Encounter for screening for malignant neoplasm of colon: Secondary | ICD-10-CM | POA: Insufficient documentation

## 2022-01-24 NOTE — Assessment & Plan Note (Signed)
He has previously been prescribed Rybelsus for management of both diabetes and morbid obesity, but states that he has not been able to afford the medication. -We will see if there are any payer assistance programs available to help with affording Rybelsus

## 2022-01-24 NOTE — Assessment & Plan Note (Addendum)
Returning to care today for BP check.  BP today 133/89.  He is currently prescribed losartan 25 mg daily and HCTZ 25 mg daily.  He is not currently checking his blood pressure at home. -Add amlodipine 5 mg daily today -Follow-up in 4 weeks for BP check

## 2022-01-24 NOTE — Assessment & Plan Note (Signed)
Remote history of OSA, but he is not currently wearing CPAP and does not recall being told that he has sleep apnea.  He certainly possesses multiple risk factors for OSA.  Multiple home sleep test have been ordered previously, but he has not followed through with testing. -Formal PSG ordered today

## 2022-01-24 NOTE — Assessment & Plan Note (Signed)
Cologuard ordered today °

## 2022-01-26 ENCOUNTER — Other Ambulatory Visit: Payer: Self-pay

## 2022-01-26 ENCOUNTER — Telehealth: Payer: Self-pay | Admitting: Internal Medicine

## 2022-01-26 DIAGNOSIS — Z1211 Encounter for screening for malignant neoplasm of colon: Secondary | ICD-10-CM

## 2022-01-26 NOTE — Telephone Encounter (Signed)
Order placed

## 2022-01-26 NOTE — Telephone Encounter (Signed)
Patient called in regard to cologuard.  Patient does not know how to use the cologuard system, is requesting to have colonoscopy orders put in to have done at hospital.   Patient wants a call back

## 2022-01-29 ENCOUNTER — Encounter: Payer: Self-pay | Admitting: *Deleted

## 2022-01-29 ENCOUNTER — Telehealth: Payer: Self-pay | Admitting: *Deleted

## 2022-01-29 MED ORDER — PEG 3350-KCL-NA BICARB-NACL 420 G PO SOLR: 4000.0000 mL | Freq: Once | ORAL | 0 refills | Status: AC

## 2022-01-29 NOTE — Telephone Encounter (Signed)
Pt called and left vm stating he needed to schedule his colonoscopy.  Tried to call pt, rang numerous times, no answer.

## 2022-01-29 NOTE — Progress Notes (Signed)
If he is not taking any medications for diabetes at this time, then I have no additional recommendations. Proceed with colonoscopy. If he starts any diabetes medications prior to colonoscopy, he will need to let us know.

## 2022-01-29 NOTE — Progress Notes (Signed)
Noted  

## 2022-01-29 NOTE — Progress Notes (Signed)
Pt has been scheduled for 03/05/22 at 9:15 am with Dr.Carver.  He says that he has never taken the Rylebsus because the medication was too expensive. He states that he has a new doctor now.  Please advise. Thank you

## 2022-01-29 NOTE — Telephone Encounter (Signed)
Pt has been scheduled for 03/05/22 at 9:15 am. Instructions and pre-op mailed. Prep sent to the pharmacy

## 2022-02-16 ENCOUNTER — Encounter: Payer: Self-pay | Admitting: Internal Medicine

## 2022-02-16 ENCOUNTER — Ambulatory Visit (INDEPENDENT_AMBULATORY_CARE_PROVIDER_SITE_OTHER): Payer: 59 | Admitting: Internal Medicine

## 2022-02-16 VITALS — BP 138/88 | HR 101 | Ht 70.0 in | Wt >= 6400 oz

## 2022-02-16 DIAGNOSIS — Z1211 Encounter for screening for malignant neoplasm of colon: Secondary | ICD-10-CM | POA: Diagnosis not present

## 2022-02-16 DIAGNOSIS — Z1212 Encounter for screening for malignant neoplasm of rectum: Secondary | ICD-10-CM | POA: Diagnosis not present

## 2022-02-16 DIAGNOSIS — I10 Essential (primary) hypertension: Secondary | ICD-10-CM

## 2022-02-16 MED ORDER — LOSARTAN POTASSIUM 50 MG PO TABS: 50.0000 mg | ORAL_TABLET | Freq: Every day | ORAL | 0 refills | Status: DC

## 2022-02-16 NOTE — Progress Notes (Signed)
Established Patient Office Visit  Subjective   Patient ID: Bobby Bridges, male    DOB: [birth year]  Age: 46 y.o. MRN: [id number]  Chief Complaint  Patient presents with   Follow-up   Bobby Bridges returns to care today.  He is a 46 year old male with past medical history significant for HTN, OSA, asthma, GERD, T2DM, HLD, and depression.  He was last seen by me on 10/13 for BP check.  His blood pressure remained elevated at that time and amlodipine 5 mg daily was added to his antihypertensive regimen.  He returns today for BP check.  There have been no acute interval events.  Today Bobby Bridges states that he feels well.  He is asymptomatic.  His acute concern is requesting a new Cologuard kit today.  Cologuard was previously ordered at his appointment in September, however he called to state that he did not know how to use it.  He was then referred to GI for colonoscopy.  He is currently scheduled to undergo colonoscopy on 11/27, however he is concerned about undergoing anesthesia and requests to try Cologuard again instead.  Past Medical History:  Diagnosis Date   Anxiety    Asthma    Chronic back pain    Depression    GERD (gastroesophageal reflux disease)    Hearing loss in left ear    Hypertension    Seizures ([place])    outgrew by age 42   Sleep apnea sleep apnea   History reviewed. No pertinent surgical history. Social History   Tobacco Use   Smoking status: Former    Packs/day: 1.00    Years: 2.00    Total pack years: 2.00    Types: Cigarettes    Quit date: 04/08/2012    Years since quitting: 9.8   Smokeless tobacco: Never  Vaping Use   Vaping Use: Never used  Substance Use Topics   Alcohol use: No   Drug use: No   Family History  Problem Relation Age of Onset   Cancer Mother    Hypertension Mother    Cancer Father    Hypertension Father    Heart disease Other        "before 45 years old"   No Known Allergies  Review of Systems  Constitutional:   Negative for chills and fever.  HENT:  Negative for sore throat.   Respiratory:  Negative for cough and shortness of breath.   Cardiovascular:  Negative for chest pain, palpitations and leg swelling.  Gastrointestinal:  Negative for abdominal pain, blood in stool, constipation, diarrhea, nausea and vomiting.  Genitourinary:  Negative for dysuria and hematuria.  Musculoskeletal:  Negative for myalgias.  Skin:  Negative for itching and rash.  Neurological:  Negative for dizziness and headaches.  Psychiatric/Behavioral:  Negative for depression and suicidal ideas.      Objective:     BP 138/88   Pulse (!) 101   Ht _0  (1.778 m)   Wt (!) 425 lb 6.4 oz (193 kg)   SpO2 98%   BMI 61.04 kg/m  BP Readings from Last 3 Encounters:  02/16/22 138/88  01/19/22 133/89  12/14/21 (!) 136/96   Physical Exam Vitals reviewed.  Constitutional:      General: He is not in acute distress.    Appearance: Normal appearance. He is obese. He is not ill-appearing.  HENT:     Head: Normocephalic and atraumatic.     Nose: Nose normal. No congestion or rhinorrhea.  Mouth/Throat:     Mouth: Mucous membranes are moist.     Pharynx: Oropharynx is clear.  Eyes:     General: No scleral icterus.    Conjunctiva/sclera: Conjunctivae normal.     Pupils: Pupils are equal, round, and reactive to light.  Cardiovascular:     Rate and Rhythm: Normal rate and regular rhythm.     Pulses: Normal pulses.     Heart sounds: Normal heart sounds. No murmur heard. Pulmonary:     Effort: Pulmonary effort is normal.     Breath sounds: Normal breath sounds. No wheezing, rhonchi or rales.  Abdominal:     General: Abdomen is flat. Bowel sounds are normal. There is no distension.     Palpations: Abdomen is soft.     Tenderness: There is no abdominal tenderness.  Musculoskeletal:        General: No swelling or deformity. Normal range of motion.     Cervical back: Normal range of motion.  Skin:    General: Skin is  warm and dry.     Capillary Refill: Capillary refill takes less than 2 seconds.  Neurological:     General: No focal deficit present.     Mental Status: He is alert and oriented to person, place, and time.     Motor: No weakness.  Psychiatric:        Mood and Affect: Mood normal.        Behavior: Behavior normal.        Thought Content: Thought content normal.    Last CBC Lab Results  Component Value Date   WBC 8.4 06/27/2019   HGB 14.0 06/27/2019   HCT 44.7 06/27/2019   MCV 84.7 06/27/2019   MCH 26.5 06/27/2019   RDW 13.9 06/27/2019   PLT 285 94/85/4627   Last metabolic panel Lab Results  Component Value Date   GLUCOSE 106 (H) 09/05/2021   NA 140 09/05/2021   K 3.9 09/05/2021   CL 110 09/05/2021   CO2 25 09/05/2021   BUN 13 09/05/2021   CREATININE 0.91 09/05/2021   GFRNONAA >60 09/05/2021   CALCIUM 8.8 (L) 09/05/2021   PROT 7.3 05/29/2021   ALBUMIN 4.1 05/29/2021   LABGLOB 3.2 05/29/2021   AGRATIO 1.3 05/29/2021   BILITOT 0.2 05/29/2021   ALKPHOS 111 05/29/2021   AST 24 05/29/2021   ALT 29 05/29/2021   ANIONGAP 5 09/05/2021   Last lipids Lab Results  Component Value Date   CHOL 203 (H) 09/05/2021   HDL 36 (L) 09/05/2021   LDLCALC 152 (H) 09/05/2021   TRIG 75 09/05/2021   CHOLHDL 5.6 09/05/2021   Last hemoglobin A1c Lab Results  Component Value Date   HGBA1C 5.8 (H) 09/05/2021   Last thyroid functions Lab Results  Component Value Date   TSH 3.540 05/08/2006    The 10-year ASCVD risk score (Arnett DK, et al., 2019) is: 15.9%    Assessment & Plan:   Problem List Items Addressed This Visit       Hypertension - Primary    BP remains above goal today.  151/91 initially, 142/92 on repeat.  His current regimen consist of amlodipine 5 mg daily, HCTZ 25 mg daily, and losartan 25 mg daily.  He is not checking his blood pressure at home. -Increase losartan to 50 mg daily.  Continue amlodipine and HCTZ at current doses. -Follow-up in 4 weeks for BP  check      Screening for colorectal cancer    Cologuard was  ordered at his last appointment, however he later called to state that he did not to use the kit and requested a referral to GI for colonoscopy.  Today he states that he is concerned about undergoing anesthesia and would like to try Cologuard again.  A new kit has been ordered today.       Return in about 4 weeks (around 03/16/2022).    Johnette Abraham, MD

## 2022-02-16 NOTE — Patient Instructions (Signed)
It was a pleasure to see you today.  Thank you for giving [place] the opportunity to be involved in your care.  Below is a brief recap of your visit and next steps.  We will plan to see you again in 4 weeks.  Summary Increase losartan to 50 mg daily. Continue amlodipine and hctz at current doses I have ordered a new cologuard kit today Follow up in 4 weeks

## 2022-02-16 NOTE — Assessment & Plan Note (Signed)
BP remains above goal today.  151/91 initially, 142/92 on repeat.  His current regimen consist of amlodipine 5 mg daily, HCTZ 25 mg daily, and losartan 25 mg daily.  He is not checking his blood pressure at home. -Increase losartan to 50 mg daily.  Continue amlodipine and HCTZ at current doses. -Follow-up in 4 weeks for BP check

## 2022-02-16 NOTE — Assessment & Plan Note (Signed)
Cologuard was ordered at his last appointment, however he later called to state that he did not to use the kit and requested a referral to GI for colonoscopy.  Today he states that he is concerned about undergoing anesthesia and would like to try Cologuard again.  A new kit has been ordered today.

## 2022-02-19 NOTE — Telephone Encounter (Signed)
Pt called to cancel procedure on 03/05/22 at 9:15 am. He states he will call back to reschedule

## 2022-02-26 ENCOUNTER — Encounter (HOSPITAL_COMMUNITY): Payer: 59

## 2022-02-28 ENCOUNTER — Other Ambulatory Visit (HOSPITAL_COMMUNITY): Payer: 59

## 2022-03-05 ENCOUNTER — Ambulatory Visit (HOSPITAL_COMMUNITY): Admission: RE | Admit: 2022-03-05 | Payer: 59 | Source: Ambulatory Visit

## 2022-03-05 ENCOUNTER — Encounter (HOSPITAL_COMMUNITY): Admission: RE | Payer: Self-pay | Source: Ambulatory Visit

## 2022-03-05 SURGERY — COLONOSCOPY WITH PROPOFOL
Anesthesia: Monitor Anesthesia Care

## 2022-03-12 ENCOUNTER — Telehealth: Payer: Self-pay | Admitting: Internal Medicine

## 2022-03-12 NOTE — Telephone Encounter (Signed)
Patient wants a callback in regard to cologuard. Has a few questions.

## 2022-03-12 NOTE — Telephone Encounter (Signed)
Reordered kit and returned call.

## 2022-03-16 ENCOUNTER — Ambulatory Visit (INDEPENDENT_AMBULATORY_CARE_PROVIDER_SITE_OTHER): Payer: 59 | Admitting: Internal Medicine

## 2022-03-16 ENCOUNTER — Encounter: Payer: Self-pay | Admitting: Internal Medicine

## 2022-03-16 VITALS — BP 136/82 | HR 108 | Ht 71.0 in | Wt >= 6400 oz

## 2022-03-16 DIAGNOSIS — I10 Essential (primary) hypertension: Secondary | ICD-10-CM

## 2022-03-16 DIAGNOSIS — Z0001 Encounter for general adult medical examination with abnormal findings: Secondary | ICD-10-CM

## 2022-03-16 NOTE — Progress Notes (Unsigned)
Established Patient Office Visit  Subjective   Patient ID: Bobby Bridges, male    DOB: 1975/06/22  Age: 46 y.o. MRN: [id number]  Chief Complaint  Patient presents with   Hypertension    Follow up   Bobby Bridges returns to care today for HTN follow-up.  He was last seen by me on 11/10 at which time his blood pressure was elevated.  Losartan was increased to 50 mg daily and 4-week follow-up was arranged for HTN check.  There have been no acute interval events.  Today Bobby Bridges reports feeling well.  He has no additional concerns to discuss today.  Past Medical History:  Diagnosis Date   Anxiety    Asthma    Chronic back pain    Depression    GERD (gastroesophageal reflux disease)    Hearing loss in left ear    Hypertension    Seizures (HCC)    outgrew by age 11   Sleep apnea sleep apnea   History reviewed. No pertinent surgical history. Social History   Tobacco Use   Smoking status: Former    Packs/day: 1.00    Years: 2.00    Total pack years: 2.00    Types: Cigarettes    Quit date: 04/08/2012    Years since quitting: 9.9   Smokeless tobacco: Never  Vaping Use   Vaping Use: Never used  Substance Use Topics   Alcohol use: No   Drug use: No   Family History  Problem Relation Age of Onset   Cancer Mother    Hypertension Mother    Cancer Father    Hypertension Father    Heart disease Other        "before 45 years old"   No Known Allergies  Review of Systems  Constitutional:  Negative for chills and fever.  HENT:  Negative for sore throat.   Respiratory:  Negative for cough and shortness of breath.   Cardiovascular:  Negative for chest pain, palpitations and leg swelling.  Gastrointestinal:  Negative for abdominal pain, blood in stool, constipation, diarrhea, nausea and vomiting.  Genitourinary:  Negative for dysuria and hematuria.  Musculoskeletal:  Negative for myalgias.  Skin:  Negative for itching and rash.  Neurological:  Negative for  dizziness and headaches.  Psychiatric/Behavioral:  Negative for depression and suicidal ideas.      Objective:     BP 136/82   Pulse (!) 108   Ht 5\' 11"  (1.803 m)   Wt (!) 421 lb 12.8 oz (191.3 kg)   SpO2 97%   BMI 58.83 kg/m  BP Readings from Last 3 Encounters:  03/16/22 136/82  02/16/22 138/88  01/19/22 133/89   Physical Exam Vitals reviewed.  Constitutional:      General: He is not in acute distress.    Appearance: Normal appearance. He is obese. He is not ill-appearing.  HENT:     Head: Normocephalic and atraumatic.     Nose: Nose normal. No congestion or rhinorrhea.     Mouth/Throat:     Mouth: Mucous membranes are moist.     Pharynx: Oropharynx is clear.  Eyes:     General: No scleral icterus.    Conjunctiva/sclera: Conjunctivae normal.     Pupils: Pupils are equal, round, and reactive to light.  Cardiovascular:     Rate and Rhythm: Normal rate and regular rhythm.     Pulses: Normal pulses.     Heart sounds: Normal heart sounds. No murmur heard. Pulmonary:  Effort: Pulmonary effort is normal.     Breath sounds: Normal breath sounds. No wheezing, rhonchi or rales.  Abdominal:     General: Abdomen is flat. Bowel sounds are normal. There is no distension.     Palpations: Abdomen is soft.     Tenderness: There is no abdominal tenderness.  Musculoskeletal:        General: No swelling or deformity. Normal range of motion.     Cervical back: Normal range of motion.  Skin:    General: Skin is warm and dry.     Capillary Refill: Capillary refill takes less than 2 seconds.  Neurological:     General: No focal deficit present.     Mental Status: He is alert and oriented to person, place, and time.     Motor: No weakness.  Psychiatric:        Mood and Affect: Mood normal.        Behavior: Behavior normal.        Thought Content: Thought content normal.    Last CBC Lab Results  Component Value Date   WBC 5.6 03/19/2022   HGB 14.8 03/19/2022   HCT 46.1  03/19/2022   MCV 81 03/19/2022   MCH 26.0 (L) 03/19/2022   RDW 13.0 03/19/2022   PLT 289 03/19/2022   Last metabolic panel Lab Results  Component Value Date   GLUCOSE 100 (H) 03/19/2022   NA 142 03/19/2022   K 4.1 03/19/2022   CL 106 03/19/2022   CO2 24 03/19/2022   BUN 13 03/19/2022   CREATININE 0.91 03/19/2022   GFRNONAA >60 09/05/2021   CALCIUM 9.0 03/19/2022   PROT 6.6 03/19/2022   ALBUMIN 3.7 (L) 03/19/2022   LABGLOB 2.9 03/19/2022   AGRATIO 1.3 03/19/2022   BILITOT 0.3 03/19/2022   ALKPHOS 105 03/19/2022   AST 21 03/19/2022   ALT 26 03/19/2022   ANIONGAP 5 09/05/2021   Last lipids Lab Results  Component Value Date   CHOL 203 (H) 03/19/2022   HDL 38 (L) 03/19/2022   LDLCALC 153 (H) 03/19/2022   TRIG 64 03/19/2022   CHOLHDL 5.3 (H) 03/19/2022   Last hemoglobin A1c Lab Results  Component Value Date   HGBA1C 5.9 (H) 03/19/2022   Last thyroid functions Lab Results  Component Value Date   TSH 3.170 03/19/2022   The 10-year ASCVD risk score (Arnett DK, et al., 2019) is: 15.3%    Assessment & Plan:   Problem List Items Addressed This Visit       Hypertension    He is currently prescribed amlodipine 5 mg daily, HCTZ 25 mg daily, and losartan 50 mg daily for treatment of hypertension.  His blood pressure today is 136/82. -No additional medication changes today -We will plan for follow-up in 3 months.  In the interim I have asked that he regularly check his blood pressure and record readings.  His goal is to be consistently below 130/80.  If his blood pressure is above goal at follow-up, we will escalate therapy.      Return in about 3 months (around 06/15/2022) for HTN, labs.    Billie Lade, MD

## 2022-03-16 NOTE — Patient Instructions (Signed)
It was a pleasure to see you today.  Thank you for giving [place] the opportunity to be involved in your care.  Below is a brief recap of your visit and next steps.  We will plan to see you again in 3 months.  Summary No medication changes today. Blood pressure has improved. We will follow up in 3 months.

## 2022-03-19 ENCOUNTER — Other Ambulatory Visit: Payer: Self-pay

## 2022-03-19 DIAGNOSIS — E785 Hyperlipidemia, unspecified: Secondary | ICD-10-CM | POA: Diagnosis not present

## 2022-03-19 DIAGNOSIS — E559 Vitamin D deficiency, unspecified: Secondary | ICD-10-CM

## 2022-03-19 DIAGNOSIS — E1169 Type 2 diabetes mellitus with other specified complication: Secondary | ICD-10-CM

## 2022-03-19 DIAGNOSIS — Z1212 Encounter for screening for malignant neoplasm of rectum: Secondary | ICD-10-CM | POA: Diagnosis not present

## 2022-03-19 DIAGNOSIS — E538 Deficiency of other specified B group vitamins: Secondary | ICD-10-CM

## 2022-03-19 DIAGNOSIS — I10 Essential (primary) hypertension: Secondary | ICD-10-CM

## 2022-03-19 DIAGNOSIS — Z1211 Encounter for screening for malignant neoplasm of colon: Secondary | ICD-10-CM | POA: Diagnosis not present

## 2022-03-20 LAB — CMP14+EGFR
ALT: 26 IU/L (ref 0–44)
AST: 21 IU/L (ref 0–40)
Albumin/Globulin Ratio: 1.3 (ref 1.2–2.2)
Albumin: 3.7 g/dL — ABNORMAL LOW (ref 4.1–5.1)
Alkaline Phosphatase: 105 IU/L (ref 44–121)
BUN/Creatinine Ratio: 14 (ref 9–20)
BUN: 13 mg/dL (ref 6–24)
Bilirubin Total: 0.3 mg/dL (ref 0.0–1.2)
CO2: 24 mmol/L (ref 20–29)
Calcium: 9 mg/dL (ref 8.7–10.2)
Chloride: 106 mmol/L (ref 96–106)
Creatinine, Ser: 0.91 mg/dL (ref 0.76–1.27)
Globulin, Total: 2.9 g/dL (ref 1.5–4.5)
Glucose: 100 mg/dL — ABNORMAL HIGH (ref 70–99)
Potassium: 4.1 mmol/L (ref 3.5–5.2)
Sodium: 142 mmol/L (ref 134–144)
Total Protein: 6.6 g/dL (ref 6.0–8.5)
eGFR: 106 mL/min/{1.73_m2} (ref >59)

## 2022-03-20 LAB — CBC WITH DIFFERENTIAL/PLATELET
Basophils Absolute: 0 10*3/uL (ref 0.0–0.2)
Basos: 1 %
EOS (ABSOLUTE): 0.1 10*3/uL (ref 0.0–0.4)
Eos: 2 %
Hematocrit: 46.1 % (ref 37.5–51.0)
Hemoglobin: 14.8 g/dL (ref 13.0–17.7)
Immature Grans (Abs): 0 10*3/uL (ref 0.0–0.1)
Immature Granulocytes: 0 %
Lymphocytes Absolute: 2.1 10*3/uL (ref 0.7–3.1)
Lymphs: 38 %
MCH: 26 pg — ABNORMAL LOW (ref 26.6–33.0)
MCHC: 32.1 g/dL (ref 31.5–35.7)
MCV: 81 fL (ref 79–97)
Monocytes Absolute: 0.4 10*3/uL (ref 0.1–0.9)
Monocytes: 8 %
Neutrophils Absolute: 2.9 10*3/uL (ref 1.4–7.0)
Neutrophils: 51 %
Platelets: 289 10*3/uL (ref 150–450)
RBC: 5.69 x10E6/uL (ref 4.14–5.80)
RDW: 13 % (ref 11.6–15.4)
WBC: 5.6 10*3/uL (ref 3.4–10.8)

## 2022-03-20 LAB — TSH+FREE T4
Free T4: 1.18 ng/dL (ref 0.82–1.77)
TSH: 3.17 u[IU]/mL (ref 0.450–4.500)

## 2022-03-20 LAB — LIPID PANEL
Chol/HDL Ratio: 5.3 ratio — ABNORMAL HIGH (ref 0.0–5.0)
Cholesterol, Total: 203 mg/dL — ABNORMAL HIGH (ref 100–199)
HDL: 38 mg/dL — ABNORMAL LOW (ref >39)
LDL Chol Calc (NIH): 153 mg/dL — ABNORMAL HIGH (ref 0–99)
Triglycerides: 64 mg/dL (ref 0–149)
VLDL Cholesterol Cal: 12 mg/dL (ref 5–40)

## 2022-03-20 LAB — VITAMIN D 25 HYDROXY (VIT D DEFICIENCY, FRACTURES): Vit D, 25-Hydroxy: 7.2 ng/mL — ABNORMAL LOW (ref 30.0–100.0)

## 2022-03-20 LAB — HEMOGLOBIN A1C
Est. average glucose Bld gHb Est-mCnc: 123 mg/dL
Hgb A1c MFr Bld: 5.9 % — ABNORMAL HIGH (ref 4.8–5.6)

## 2022-03-20 LAB — B12 AND FOLATE PANEL
Folate: 8.8 ng/mL (ref >3.0)
Vitamin B-12: 307 pg/mL (ref 232–1245)

## 2022-03-21 ENCOUNTER — Other Ambulatory Visit: Payer: Self-pay | Admitting: Internal Medicine

## 2022-03-21 DIAGNOSIS — E785 Hyperlipidemia, unspecified: Secondary | ICD-10-CM

## 2022-03-21 DIAGNOSIS — E559 Vitamin D deficiency, unspecified: Secondary | ICD-10-CM

## 2022-03-21 MED ORDER — VITAMIN D (ERGOCALCIFEROL) 1.25 MG (50000 UNIT) PO CAPS: 50000.0000 [IU] | ORAL_CAPSULE | ORAL | 0 refills | Status: AC

## 2022-03-21 MED ORDER — ATORVASTATIN CALCIUM 40 MG PO TABS: 40.0000 mg | ORAL_TABLET | Freq: Every day | ORAL | 3 refills | Status: DC

## 2022-03-21 NOTE — Assessment & Plan Note (Signed)
He is currently prescribed amlodipine 5 mg daily, HCTZ 25 mg daily, and losartan 50 mg daily for treatment of hypertension.  His blood pressure today is 136/82. -No additional medication changes today -We will plan for follow-up in 3 months

## 2022-03-26 LAB — COLOGUARD: COLOGUARD: NEGATIVE

## 2022-03-29 ENCOUNTER — Telehealth: Payer: Self-pay | Admitting: Internal Medicine

## 2022-03-29 NOTE — Telephone Encounter (Signed)
Patient advised.

## 2022-03-29 NOTE — Telephone Encounter (Signed)
Patient called in for cologuard results

## 2022-04-11 ENCOUNTER — Telehealth: Payer: Self-pay | Admitting: Internal Medicine

## 2022-04-11 NOTE — Telephone Encounter (Signed)
Error

## 2022-04-27 ENCOUNTER — Ambulatory Visit: Payer: 59 | Attending: Internal Medicine | Admitting: Pulmonary Disease

## 2022-04-27 DIAGNOSIS — I10 Essential (primary) hypertension: Secondary | ICD-10-CM | POA: Diagnosis not present

## 2022-04-27 DIAGNOSIS — G4733 Obstructive sleep apnea (adult) (pediatric): Secondary | ICD-10-CM | POA: Insufficient documentation

## 2022-04-27 DIAGNOSIS — Z9189 Other specified personal risk factors, not elsewhere classified: Secondary | ICD-10-CM

## 2022-05-11 DIAGNOSIS — Z9189 Other specified personal risk factors, not elsewhere classified: Secondary | ICD-10-CM

## 2022-05-11 NOTE — Procedures (Signed)
      Patient Name: Bobby Bridges, Bobby Bridges Date: 04/27/2022 Gender: Male D.O.B: [birth year] Age (years): 46 Referring Provider: Johnette Abraham Height (inches): 71 Interpreting Physician: Chesley Mires MD, ABSM Weight (lbs): 421 RPSGT: Peak, Robert BMI: 59 MRN: [id number]  CLINICAL INFORMATION Sleep Study Type: NPSG  Indication for sleep study: snoring, sleep disruption and daytime sleepiness.  SLEEP STUDY TECHNIQUE As per the AASM Manual for the Scoring of Sleep and Associated Events v2.3 (April 2016) with a hypopnea requiring 4% desaturations.  The channels recorded and monitored were frontal, central and occipital EEG, electrooculogram (EOG), submentalis EMG (chin), nasal and oral airflow, thoracic and abdominal wall motion, anterior tibialis EMG, snore microphone, electrocardiogram, and pulse oximetry.  MEDICATIONS Medications self-administered by patient taken the night of the study : N/A  SLEEP ARCHITECTURE The study was initiated at 9:33:54 PM and ended at 5:00:31 AM.  Sleep onset time was 14.4 minutes and the sleep efficiency was 85.1%. The total sleep time was 380.2 minutes.  Stage REM latency was 123.5 minutes.  The patient spent 7.10% of the night in stage N1 sleep, 81.72% in stage N2 sleep, 0.00% in stage N3 and 11.2% in REM.  Alpha intrusion was absent.  Supine sleep was 0.49%.  RESPIRATORY PARAMETERS The overall apnea/hypopnea index (AHI) was 30.9 per hour. There were 44 total apneas, including 37 obstructive, 6 central and 1 mixed apneas. There were 152 hypopneas and 0 RERAs.  The AHI during Stage REM sleep was 131.3 per hour.  AHI while supine was 32.3 per hour.  The mean oxygen saturation was 93.17%. The minimum SpO2 during sleep was 70.00%.  CARDIAC DATA The 2 lead EKG demonstrated sinus rhythm. The mean heart rate was 80.34 beats per minute. Other EKG findings include: PVCs.  LEG MOVEMENT DATA The total PLMS were 0 with a resulting PLMS  index of 0.00. Associated arousal with leg movement index was 0.0 .  IMPRESSIONS - Severe obstructive sleep apnea with an AHI of 30.9 and SpO2 low of 70%.  He had a significant REM effect to his sleep apnea. - EKG findings include PVCs. - Clinically significant periodic limb movements did not occur during sleep. No significant associated arousals.  DIAGNOSIS - Obstructive Sleep Apnea (G47.33)  RECOMMENDATIONS - Additional therapies include weight loss, CPAP, oral appliance, or surgical assessment. - Sleep medicine consultation available to assist with management if needed. - Avoid alcohol, sedatives and other CNS depressants that may worsen sleep apnea and disrupt normal sleep architecture. - Sleep hygiene should be reviewed to assess factors that may improve sleep quality.  [Electronically signed] 05/11/2022 10:44 AM  Chesley Mires MD, ABSM Diplomate, American Board of Sleep Medicine NPI: [id number]  [place] PH: [phone]   FX: [phone] [place]

## 2022-06-19 ENCOUNTER — Emergency Department (HOSPITAL_COMMUNITY): Payer: 59

## 2022-06-19 ENCOUNTER — Emergency Department (HOSPITAL_COMMUNITY)
Admission: EM | Admit: 2022-06-19 | Discharge: 2022-06-20 | Disposition: A | Discharge status: Home / Self Care | Payer: 59 | Attending: Emergency Medicine | Admitting: Emergency Medicine

## 2022-06-19 ENCOUNTER — Encounter (HOSPITAL_COMMUNITY): Payer: Self-pay | Admitting: Emergency Medicine

## 2022-06-19 ENCOUNTER — Other Ambulatory Visit: Payer: Self-pay

## 2022-06-19 DIAGNOSIS — Z1152 Encounter for screening for COVID-19: Secondary | ICD-10-CM | POA: Insufficient documentation

## 2022-06-19 DIAGNOSIS — J45909 Unspecified asthma, uncomplicated: Secondary | ICD-10-CM | POA: Insufficient documentation

## 2022-06-19 DIAGNOSIS — J205 Acute bronchitis due to respiratory syncytial virus: Secondary | ICD-10-CM

## 2022-06-19 LAB — RESP PANEL BY RT-PCR (RSV, FLU A&B, COVID)  RVPGX2
Influenza A by PCR: NEGATIVE
Influenza B by PCR: NEGATIVE
Resp Syncytial Virus by PCR: POSITIVE — AB
SARS Coronavirus 2 by RT PCR: NEGATIVE

## 2022-06-19 MED ORDER — BENZONATATE 200 MG PO CAPS: 200.0000 mg | ORAL_CAPSULE | Freq: Three times a day (TID) | ORAL | 0 refills | Status: DC | PRN

## 2022-06-19 MED ORDER — PREDNISONE 20 MG PO TABS: 40.0000 mg | ORAL_TABLET | Freq: Every day | ORAL | 0 refills | Status: DC

## 2022-06-19 MED ORDER — ALBUTEROL SULFATE HFA 108 (90 BASE) MCG/ACT IN AERS
2.0000 | INHALATION_SPRAY | RESPIRATORY_TRACT | Status: DC | PRN
  Administered 2022-06-20: 2 via RESPIRATORY_TRACT
  Filled 2022-06-19: qty 6.7

## 2022-06-19 NOTE — ED Provider Notes (Signed)
[facility] Provider Note   CSN: [id number] Arrival date & time: 06/19/22  2121     History  Chief Complaint  Patient presents with   Cough    Bobby Bridges is a 47 y.o. male.  Patient presents to the emergency department for evaluation of cough.  Patient reports symptoms began 2 or 3 days ago.  He feels like he is wheezing.  He does have a history of mild asthma.  He does not have an inhaler.  Patient concerned that he might have COVID.       Home Medications Prior to Admission medications   Medication Sig Start Date End Date Taking? Authorizing Provider  benzonatate (TESSALON) 200 MG capsule Take 1 capsule (200 mg total) by mouth 3 (three) times daily as needed for cough. 06/19/22  Yes Briseis Aguilera, Gwenyth Allegra, MD  predniSONE (DELTASONE) 20 MG tablet Take 2 tablets (40 mg total) by mouth daily with breakfast. 06/19/22  Yes Jaiveon Suppes, Gwenyth Allegra, MD  acetaminophen (TYLENOL) 500 MG tablet Take 500 mg by mouth every 6 (six) hours as needed for mild pain or moderate pain.    [provider]  albuterol (VENTOLIN HFA) 108 (90 Base) MCG/ACT inhaler Inhale 2 puffs into the lungs every 6 (six) hours as needed for wheezing or shortness of breath. 05/29/21   Paseda, Dewaine Conger, FNP  amLODipine (NORVASC) 5 MG tablet Take 1 tablet (5 mg total) by mouth daily. 01/19/22   Johnette Abraham, MD  atorvastatin (LIPITOR) 40 MG tablet Take 1 tablet (40 mg total) by mouth daily. 03/21/22   Johnette Abraham, MD  hydrochlorothiazide (HYDRODIURIL) 25 MG tablet Take 1 tablet (25 mg total) by mouth daily. 12/14/21   Paseda, Dewaine Conger, FNP  hydrOXYzine (VISTARIL) 50 MG capsule Take 1 capsule (50 mg total) by mouth at bedtime as needed. 12/14/21   Paseda, Dewaine Conger, FNP  losartan (COZAAR) 50 MG tablet Take 1 tablet (50 mg total) by mouth daily. 02/16/22 05/17/22  Johnette Abraham, MD  sildenafil (VIAGRA) 50 MG tablet Take 1 tablet (50 mg total) by mouth  daily as needed for erectile dysfunction. 12/14/21   Renee Rival, FNP  traZODone (DESYREL) 100 MG tablet TAKE 2 TABLET BY MOUTH AT BEDTIME FOR SLEEP (200 mg) 09/01/21   Paseda, Dewaine Conger, FNP      Allergies    Patient has no known allergies.    Review of Systems   Review of Systems  Physical Exam Updated Vital Signs BP (!) 152/99 (BP Location: Right Arm)   Pulse (!) 107   Temp 98.8 F (37.1 C) (Oral)   Resp 20   Ht '5\' 11"'$  (1.803 m)   Wt (!) 191.3 kg   SpO2 95%   BMI 58.82 kg/m  Physical Exam Vitals and nursing note reviewed.  Constitutional:      General: He is not in acute distress.    Appearance: He is well-developed. He is obese.  HENT:     Head: Normocephalic and atraumatic.     Mouth/Throat:     Mouth: Mucous membranes are moist.  Eyes:     General: Vision grossly intact. Gaze aligned appropriately.     Extraocular Movements: Extraocular movements intact.     Conjunctiva/sclera: Conjunctivae normal.  Cardiovascular:     Rate and Rhythm: Normal rate and regular rhythm.     Pulses: Normal pulses.     Heart sounds: Normal heart sounds, S1 normal and S2 normal.  No murmur heard.    No friction rub. No gallop.  Pulmonary:     Effort: Pulmonary effort is normal. No respiratory distress.     Breath sounds: Normal breath sounds.  Abdominal:     Palpations: Abdomen is soft.     Tenderness: There is no abdominal tenderness. There is no guarding or rebound.     Hernia: No hernia is present.  Musculoskeletal:        General: No swelling.     Cervical back: Full passive range of motion without pain, normal range of motion and neck supple. No pain with movement, spinous process tenderness or muscular tenderness. Normal range of motion.     Right lower leg: No edema.     Left lower leg: No edema.  Skin:    General: Skin is warm and dry.     Capillary Refill: Capillary refill takes less than 2 seconds.     Findings: No ecchymosis, erythema, lesion or wound.   Neurological:     Mental Status: He is alert and oriented to person, place, and time.     GCS: GCS eye subscore is 4. GCS verbal subscore is 5. GCS motor subscore is 6.     Cranial Nerves: Cranial nerves 2-12 are intact.     Sensory: Sensation is intact.     Motor: Motor function is intact. No weakness or abnormal muscle tone.     Coordination: Coordination is intact.  Psychiatric:        Mood and Affect: Mood normal.        Speech: Speech normal.        Behavior: Behavior normal.     ED Results / Procedures / Treatments   Labs (all labs ordered are listed, but only abnormal results are displayed) Labs Reviewed  RESP PANEL BY RT-PCR (RSV, FLU A&B, COVID)  RVPGX2 - Abnormal; Notable for the following components:      Result Value   Resp Syncytial Virus by PCR POSITIVE (*)    All other components within normal limits    EKG None  Radiology DG Chest Port 1 View  Result Date: 06/19/2022 CLINICAL DATA:  Cough for 3 days EXAM: PORTABLE CHEST 1 VIEW COMPARISON:  06/16/2020 FINDINGS: Stable cardiomegaly. Pulmonary vascular congestion. Interstitial coarsening in the bilateral lower lungs. No focal consolidation, pleural effusion, or pneumothorax. No displaced rib fractures. IMPRESSION: Interstitial coarsening in the lower lung suspicious for atypical infection. Electronically Signed   By: Placido Sou M.D.   On: 06/19/2022 23:45    Procedures Procedures    Medications Ordered in ED Medications  albuterol (VENTOLIN HFA) 108 (90 Base) MCG/ACT inhaler 2 puff (has no administration in time range)    ED Course/ Medical Decision Making/ A&P                             Medical Decision Making Amount and/or Complexity of Data Reviewed Radiology: ordered and independent interpretation performed.   Differential Diagnosis considered includes, but not limited to: COVID-19; influenza; RSV; simple viral URI; pneumonia; asthma exacerbation  Currently appears well.  No wheezing on  auscultation.  X-ray does not show pneumonia.  COVID-negative, influenza negative.  Patient did test positive for RSV which explains his symptoms.  Will provide albuterol inhaler, prednisone.  Given return precautions.        Final Clinical Impression(s) / ED Diagnoses Final diagnoses:  RSV bronchitis    Rx / DC Orders ED  Discharge Orders          Ordered    predniSONE (DELTASONE) 20 MG tablet  Daily with breakfast        06/19/22 2351    benzonatate (TESSALON) 200 MG capsule  3 times daily PRN        06/19/22 2351              Orpah Greek, MD 06/19/22 2351

## 2022-06-19 NOTE — ED Triage Notes (Signed)
Pt c/o cough 2-3 days ago.

## 2022-07-19 ENCOUNTER — Ambulatory Visit: Payer: 59 | Admitting: Internal Medicine

## 2022-08-03 ENCOUNTER — Ambulatory Visit (INDEPENDENT_AMBULATORY_CARE_PROVIDER_SITE_OTHER): Payer: Self-pay | Admitting: Internal Medicine

## 2022-08-03 ENCOUNTER — Encounter: Payer: Self-pay | Admitting: Internal Medicine

## 2022-08-03 VITALS — BP 121/80 | HR 100 | Temp 97.9°F | Ht 71.0 in | Wt >= 6400 oz

## 2022-08-03 DIAGNOSIS — E1169 Type 2 diabetes mellitus with other specified complication: Secondary | ICD-10-CM

## 2022-08-03 DIAGNOSIS — E785 Hyperlipidemia, unspecified: Secondary | ICD-10-CM

## 2022-08-03 DIAGNOSIS — G4709 Other insomnia: Secondary | ICD-10-CM

## 2022-08-03 DIAGNOSIS — G4733 Obstructive sleep apnea (adult) (pediatric): Secondary | ICD-10-CM

## 2022-08-03 DIAGNOSIS — E559 Vitamin D deficiency, unspecified: Secondary | ICD-10-CM

## 2022-08-03 DIAGNOSIS — I10 Essential (primary) hypertension: Secondary | ICD-10-CM

## 2022-08-03 MED ORDER — HYDROXYZINE PAMOATE 50 MG PO CAPS: 50.0000 mg | ORAL_CAPSULE | Freq: Every evening | ORAL | 1 refills | Status: DC | PRN

## 2022-08-03 MED ORDER — LOSARTAN POTASSIUM 50 MG PO TABS: 50.0000 mg | ORAL_TABLET | Freq: Every day | ORAL | 0 refills | Status: DC

## 2022-08-03 MED ORDER — ATORVASTATIN CALCIUM 40 MG PO TABS: 40.0000 mg | ORAL_TABLET | Freq: Every day | ORAL | 3 refills | Status: DC

## 2022-08-03 NOTE — Progress Notes (Signed)
Established Patient Office Visit  Subjective   Patient ID: Bobby Bridges, male    DOB: [birth year]  Age: 47 y.o. MRN: [id number]  Chief Complaint  Patient presents with   Follow-up    follow up 3 months, HTN & LABS   Bobby Bridges returns to care today for follow-up.  He was last evaluated by me on 03/16/22 for HTN check.  No medication changes were made at that time and 43-month follow-up was arranged.  In the interim he underwent PSG on 1/19, which revealed severe OSA.  He also presented to the emergency department on 3/12 for URI symptoms.  There have otherwise been no acute interval events.  Bobby Bridges reports feeling well today.  He endorses insomnia and states that trazodone is ineffective.  He would like to switch back to hydroxyzine.  He is otherwise asymptomatic and has no additional concerns to discuss today.   Past Medical History:  Diagnosis Date   Anxiety    Asthma    Chronic back pain    Depression    GERD (gastroesophageal reflux disease)    Hearing loss in left ear    Hypertension    Seizures (HCC)    outgrew by age 35   Sleep apnea sleep apnea   History reviewed. No pertinent surgical history. Social History   Tobacco Use   Smoking status: Former    Packs/day: 1.00    Years: 2.00    Additional pack years: 0.00    Total pack years: 2.00    Types: Cigarettes    Quit date: 04/08/2012    Years since quitting: 10.3   Smokeless tobacco: Never  Vaping Use   Vaping Use: Never used  Substance Use Topics   Alcohol use: No   Drug use: No   Family History  Problem Relation Age of Onset   Cancer Mother    Hypertension Mother    Cancer Father    Hypertension Father    Heart disease Other        "before 46 years old"   No Known Allergies    Review of Systems  Constitutional:  Negative for chills and fever.  HENT:  Negative for sore throat.   Respiratory:  Negative for cough and shortness of breath.   Cardiovascular:  Negative for chest pain,  palpitations and leg swelling.  Gastrointestinal:  Negative for abdominal pain, blood in stool, constipation, diarrhea, nausea and vomiting.  Genitourinary:  Negative for dysuria and hematuria.  Musculoskeletal:  Negative for myalgias.  Skin:  Negative for itching and rash.  Neurological:  Negative for dizziness and headaches.  Psychiatric/Behavioral:  Negative for depression and suicidal ideas. The patient has insomnia.      Objective:     BP 121/80   Pulse 100   Temp 97.9 F (36.6 C) (Oral)   Ht 5\' 11"  (1.803 m)   Wt (!) 409 lb (185.5 kg)   SpO2 97%   BMI 57.04 kg/m  BP Readings from Last 3 Encounters:  08/03/22 121/80  06/19/22 (!) 152/99  03/16/22 136/82   Physical Exam Vitals reviewed.  Constitutional:      General: He is not in acute distress.    Appearance: Normal appearance. He is obese. He is not ill-appearing.  HENT:     Head: Normocephalic and atraumatic.     Nose: Nose normal. No congestion or rhinorrhea.     Mouth/Throat:     Mouth: Mucous membranes are moist.     Pharynx: Oropharynx  is clear.  Eyes:     General: No scleral icterus.    Conjunctiva/sclera: Conjunctivae normal.     Pupils: Pupils are equal, round, and reactive to light.  Cardiovascular:     Rate and Rhythm: Normal rate and regular rhythm.     Pulses: Normal pulses.     Heart sounds: Normal heart sounds. No murmur heard. Pulmonary:     Effort: Pulmonary effort is normal.     Breath sounds: Normal breath sounds. No wheezing, rhonchi or rales.  Abdominal:     General: Abdomen is flat. Bowel sounds are normal. There is no distension.     Palpations: Abdomen is soft.     Tenderness: There is no abdominal tenderness.  Musculoskeletal:        General: No swelling or deformity. Normal range of motion.     Cervical back: Normal range of motion.  Skin:    General: Skin is warm and dry.     Capillary Refill: Capillary refill takes less than 2 seconds.  Neurological:     General: No focal  deficit present.     Mental Status: He is alert and oriented to person, place, and time.     Motor: No weakness.  Psychiatric:        Mood and Affect: Mood normal.        Behavior: Behavior normal.        Thought Content: Thought content normal.   Last CBC Lab Results  Component Value Date   WBC 5.6 03/19/2022   HGB 14.8 03/19/2022   HCT 46.1 03/19/2022   MCV 81 03/19/2022   MCH 26.0 (L) 03/19/2022   RDW 13.0 03/19/2022   PLT 289 03/19/2022   Last metabolic panel Lab Results  Component Value Date   GLUCOSE 100 (H) 03/19/2022   NA 142 03/19/2022   K 4.1 03/19/2022   CL 106 03/19/2022   CO2 24 03/19/2022   BUN 13 03/19/2022   CREATININE 0.91 03/19/2022   EGFR 106 03/19/2022   CALCIUM 9.0 03/19/2022   PROT 6.6 03/19/2022   ALBUMIN 3.7 (L) 03/19/2022   LABGLOB 2.9 03/19/2022   AGRATIO 1.3 03/19/2022   BILITOT 0.3 03/19/2022   ALKPHOS 105 03/19/2022   AST 21 03/19/2022   ALT 26 03/19/2022   ANIONGAP 5 09/05/2021   Last lipids Lab Results  Component Value Date   CHOL 203 (H) 03/19/2022   HDL 38 (L) 03/19/2022   LDLCALC 153 (H) 03/19/2022   TRIG 64 03/19/2022   CHOLHDL 5.3 (H) 03/19/2022   Last hemoglobin A1c Lab Results  Component Value Date   HGBA1C 5.9 (H) 03/19/2022   Last thyroid functions Lab Results  Component Value Date   TSH 3.170 03/19/2022   Last vitamin D Lab Results  Component Value Date   VD25OH 7.2 (L) 03/19/2022   Last vitamin B12 and Folate Lab Results  Component Value Date   VITAMINB12 307 03/19/2022   FOLATE 8.8 03/19/2022    The 10-year ASCVD risk score (Arnett DK, et al., 2019) is: 12.4%    Assessment & Plan:   Problem List Items Addressed This Visit       Hypertension    He is currently prescribed amlodipine 5 mg daily, HCTZ 25 mg daily, and losartan 50 mg daily for treatment of hypertension.  BP today is 121/80. -No medication changes today.  Continue current antihypertensive regimen.      Severe obstructive sleep  apnea - Primary    PSG completed in  January.  Results consistent with severe OSA.  Does not currently have CPAP machine or supplies. -Sleep medicine referral placed today      Hyperlipidemia associated with type 2 diabetes mellitus (HCC)    Lipid panel last updated in December.  Total cholesterol 230 and LDL 153.  His 10-year ASCVD risk today is 12.4%.  He is currently prescribed atorvastatin 40 mg daily. -Repeat lipid panel ordered today      Type 2 diabetes mellitus with other specified complication (HCC)    Diet controlled.  Last A1c 5.9 in December. -No medication changes today -Repeat urine albumin/creatinine ratio ordered -Due for diabetic eye exam. He has previously been referred to ophthalmology and was provided with contact information today to call to schedule an appointment.      Insomnia disorder    Symptoms are largely unchanged.  He would like to switch trazodone back to hydroxyzine. -Hydroxyzine 50 mg nightly as needed prescribed today      Vitamin D deficiency    Noted on recent labs.  He has completed high-dose, weekly vitamin D supplementation x 12 weeks. -Repeat vitamin D level ordered today      Return in about 3 months (around 11/02/2022).   Billie Lade, MD

## 2022-08-03 NOTE — Assessment & Plan Note (Signed)
He is currently prescribed amlodipine 5 mg daily, HCTZ 25 mg daily, and losartan 50 mg daily for treatment of hypertension.  BP today is 121/80. -No medication changes today.  Continue current antihypertensive regimen.

## 2022-08-03 NOTE — Patient Instructions (Addendum)
It was a pleasure to see you today.  Thank you for giving [place] the opportunity to be involved in your care.  Below is a brief recap of your visit and next steps.  We will plan to see you again in 3 months.  Summary No medication changes today Repeat labs ordered Switch back to hydroxyzine for sleep You have been referred to pulmonology for sleep apnea Please see the contact information for [facility] below Follow up in 3 months  [facility]:  [phone]

## 2022-08-03 NOTE — Assessment & Plan Note (Signed)
Lipid panel last updated in December.  Total cholesterol 230 and LDL 153.  His 10-year ASCVD risk today is 12.4%.  He is currently prescribed atorvastatin 40 mg daily. -Repeat lipid panel ordered today

## 2022-08-03 NOTE — Assessment & Plan Note (Signed)
Noted on recent labs.  He has completed high-dose, weekly vitamin D supplementation x 12 weeks. -Repeat vitamin D level ordered today

## 2022-08-03 NOTE — Assessment & Plan Note (Signed)
PSG completed in January.  Results consistent with severe OSA.  Does not currently have CPAP machine or supplies. -Sleep medicine referral placed today

## 2022-08-03 NOTE — Assessment & Plan Note (Signed)
Diet controlled.  Last A1c 5.9 in December. -No medication changes today -Repeat urine albumin/creatinine ratio ordered -Due for diabetic eye exam. He has previously been referred to ophthalmology and was provided with contact information today to call to schedule an appointment.

## 2022-08-03 NOTE — Assessment & Plan Note (Signed)
Symptoms are largely unchanged.  He would like to switch trazodone back to hydroxyzine. -Hydroxyzine 50 mg nightly as needed prescribed today

## 2022-08-05 ENCOUNTER — Other Ambulatory Visit: Payer: Self-pay | Admitting: Internal Medicine

## 2022-08-05 DIAGNOSIS — E559 Vitamin D deficiency, unspecified: Secondary | ICD-10-CM

## 2022-08-05 LAB — MICROALBUMIN / CREATININE URINE RATIO
Creatinine, Urine: 161.5 mg/dL
Microalb/Creat Ratio: 2 mg/g creat (ref 0–29)
Microalbumin, Urine: 3.9 ug/mL

## 2022-08-05 LAB — VITAMIN D 25 HYDROXY (VIT D DEFICIENCY, FRACTURES): Vit D, 25-Hydroxy: 7.5 ng/mL — ABNORMAL LOW (ref 30.0–100.0)

## 2022-08-05 MED ORDER — VITAMIN D (ERGOCALCIFEROL) 1.25 MG (50000 UNIT) PO CAPS: 50000.0000 [IU] | ORAL_CAPSULE | ORAL | 0 refills | Status: AC

## 2022-08-20 ENCOUNTER — Institutional Professional Consult (permissible substitution): Payer: 59 | Admitting: Nurse Practitioner

## 2022-08-30 ENCOUNTER — Telehealth: Payer: Self-pay | Admitting: Internal Medicine

## 2022-08-30 NOTE — Telephone Encounter (Signed)
Returned patient call.

## 2022-08-30 NOTE — Telephone Encounter (Signed)
Patient called in requesting referral to an eye doctor.  Wants a call back.

## 2022-09-05 ENCOUNTER — Ambulatory Visit: Payer: Self-pay

## 2022-09-17 ENCOUNTER — Encounter: Payer: Self-pay | Admitting: Nurse Practitioner

## 2022-09-17 ENCOUNTER — Institutional Professional Consult (permissible substitution): Payer: 59 | Admitting: Nurse Practitioner

## 2022-11-09 ENCOUNTER — Ambulatory Visit: Payer: Self-pay | Admitting: Internal Medicine

## 2022-11-14 ENCOUNTER — Encounter: Payer: Self-pay | Admitting: Internal Medicine

## 2022-11-28 ENCOUNTER — Ambulatory Visit: Payer: Self-pay

## 2023-01-07 ENCOUNTER — Ambulatory Visit: Payer: Self-pay | Admitting: Internal Medicine

## 2023-01-07 ENCOUNTER — Ambulatory Visit: Payer: Self-pay

## 2023-01-11 ENCOUNTER — Emergency Department (HOSPITAL_COMMUNITY)
Admission: EM | Admit: 2023-01-11 | Discharge: 2023-01-12 | Disposition: A | Discharge status: Home / Self Care | Payer: 59 | Attending: Emergency Medicine | Admitting: Emergency Medicine

## 2023-01-11 DIAGNOSIS — U071 COVID-19: Secondary | ICD-10-CM | POA: Insufficient documentation

## 2023-01-11 DIAGNOSIS — R059 Cough, unspecified: Secondary | ICD-10-CM | POA: Diagnosis not present

## 2023-01-11 LAB — RESP PANEL BY RT-PCR (RSV, FLU A&B, COVID)  RVPGX2
Influenza A by PCR: NEGATIVE
Influenza B by PCR: NEGATIVE
Resp Syncytial Virus by PCR: NEGATIVE
SARS Coronavirus 2 by RT PCR: POSITIVE — AB

## 2023-01-11 NOTE — ED Triage Notes (Signed)
Pt presents to ED complaining of Cough, congestion and runny nose that started today. Denies SOB. Hx of bronchitis.

## 2023-01-12 MED ORDER — HYDROCODONE BIT-HOMATROP MBR 5-1.5 MG/5ML PO SOLN: 5.0000 mL | Freq: Four times a day (QID) | ORAL | 0 refills | Status: DC | PRN

## 2023-01-12 MED ORDER — HYDROCODONE BIT-HOMATROP MBR 5-1.5 MG/5ML PO SOLN
5.0000 mL | Freq: Once | ORAL | Status: AC
  Administered 2023-01-12: 5 mL via ORAL
  Filled 2023-01-12: qty 5

## 2023-01-12 NOTE — ED Provider Notes (Signed)
[facility] EMERGENCY DEPARTMENT AT [facility] Provider Note   CSN: [id number] Arrival date & time: 01/11/23  2112     History  Chief Complaint  Patient presents with   Cough    Pt presents to ED complaining of Cough, congestion and runny nose that started today. Denies SOB. Hx of bronchitis.    Bobby Bridges is a 47 y.o. male.  47 yo otherwise healthy male here with a day of cough, malaise, body aches. No known sick contactts.  Maybe elevated nausea but no vomiting.  Did have a rhinorrhea but that is improved.  The bit of a frontal headache and postnasal drip/sore throat that is intermittent.  Has a history of bronchitis.  No other medical history.   Cough      Home Medications Prior to Admission medications   Medication Sig Start Date End Date Taking? Authorizing Provider  HYDROcodone bit-homatropine (HYCODAN) 5-1.5 MG/5ML syrup Take 5 mLs by mouth every 6 (six) hours as needed for cough. 01/12/23  Yes Raja Caputi, Barbara Cower, MD  acetaminophen (TYLENOL) 500 MG tablet Take 500 mg by mouth every 6 (six) hours as needed for mild pain or moderate pain.    [provider]  albuterol (VENTOLIN HFA) 108 (90 Base) MCG/ACT inhaler Inhale 2 puffs into the lungs every 6 (six) hours as needed for wheezing or shortness of breath. 05/29/21   Paseda, Baird Kay, FNP  amLODipine (NORVASC) 5 MG tablet Take 1 tablet (5 mg total) by mouth daily. 01/19/22   Billie Lade, MD  atorvastatin (LIPITOR) 40 MG tablet Take 1 tablet (40 mg total) by mouth daily. 08/03/22   Billie Lade, MD  hydrochlorothiazide (HYDRODIURIL) 25 MG tablet Take 1 tablet (25 mg total) by mouth daily. 12/14/21   Paseda, Baird Kay, FNP  hydrOXYzine (VISTARIL) 50 MG capsule Take 1 capsule (50 mg total) by mouth at bedtime as needed. 08/03/22   Billie Lade, MD  losartan (COZAAR) 50 MG tablet Take 1 tablet (50 mg total) by mouth daily. 08/03/22 11/01/22  Billie Lade, MD  sildenafil (VIAGRA) 50 MG  tablet Take 1 tablet (50 mg total) by mouth daily as needed for erectile dysfunction. 12/14/21   Donell Beers, FNP      Allergies    Patient has no known allergies.    Review of Systems   Review of Systems  Respiratory:  Positive for cough.     Physical Exam Updated Vital Signs BP (!) 138/115 (BP Location: Right Arm)   Pulse 96   Temp 99.1 F (37.3 C) (Oral)   Resp 18   Ht 5\' 11"  (1.803 m)   Wt (!) 185.5 kg   SpO2 97%   BMI 57.04 kg/m  Physical Exam Vitals and nursing note reviewed.  Constitutional:      Appearance: He is well-developed.  HENT:     Head: Normocephalic and atraumatic.     Mouth/Throat:     Pharynx: Oropharynx is clear.  Cardiovascular:     Rate and Rhythm: Normal rate.  Pulmonary:     Effort: Pulmonary effort is normal. No respiratory distress.     Breath sounds: Normal breath sounds.  Abdominal:     General: There is no distension.  Musculoskeletal:        General: Normal range of motion.     Cervical back: Normal range of motion.  Skin:    General: Skin is warm and dry.  Neurological:     Mental Status: He  is alert.     ED Results / Procedures / Treatments   Labs (all labs ordered are listed, but only abnormal results are displayed) Labs Reviewed  RESP PANEL BY RT-PCR (RSV, FLU A&B, COVID)  RVPGX2 - Abnormal; Notable for the following components:      Result Value   SARS Coronavirus 2 by RT PCR POSITIVE (*)    All other components within normal limits    EKG None  Radiology No results found.  Procedures Procedures    Medications Ordered in ED Medications  HYDROcodone bit-homatropine (HYCODAN) 5-1.5 MG/5ML syrup 5 mL (has no administration in time range)    ED Course/ Medical Decision Making/ A&P                                 Medical Decision Making Risk Prescription drug management.   Patient with viral like illness symptoms and found to be positive for COVID is likely etiology.  No risk factors or indication  for outpatient treatment.  No risk factors or indication for further imaging.  Lungs are clear I doubt steroids or inhaler reviewed with child.  I will just treat symptomatically at this time.  I discussed Veneta Penton, NyQuil/DayQuil and anti-inflammatories to keep his symptoms under control.  Follow-up with PCP 3 days if not improving here if things worsen.  Work note provided for 5 days.   Final Clinical Impression(s) / ED Diagnoses Final diagnoses:  COVID-19    Rx / DC Orders ED Discharge Orders          Ordered    HYDROcodone bit-homatropine (HYCODAN) 5-1.5 MG/5ML syrup  Every 6 hours PRN        01/12/23 0011              Nayelli Inglis, Barbara Cower, MD 01/12/23 1610

## 2023-02-04 ENCOUNTER — Ambulatory Visit: Payer: 59

## 2023-02-06 ENCOUNTER — Other Ambulatory Visit: Payer: Self-pay | Admitting: Internal Medicine

## 2023-02-06 DIAGNOSIS — E1169 Type 2 diabetes mellitus with other specified complication: Secondary | ICD-10-CM

## 2023-02-11 ENCOUNTER — Ambulatory Visit: Payer: Self-pay | Admitting: Internal Medicine

## 2023-03-18 ENCOUNTER — Telehealth: Payer: Self-pay | Admitting: Internal Medicine

## 2023-03-18 NOTE — Telephone Encounter (Signed)
Patient is needing to reschedule his appt for tomorrow I tried to reschedule patient appt I was unable to do so due to the dr schedule not coming up for me

## 2023-03-19 ENCOUNTER — Ambulatory Visit: Payer: Self-pay | Admitting: Internal Medicine

## 2023-05-03 ENCOUNTER — Encounter: Payer: Self-pay | Admitting: Internal Medicine

## 2023-05-03 ENCOUNTER — Ambulatory Visit: Payer: Self-pay | Admitting: Internal Medicine

## 2023-05-03 VITALS — BP 163/96 | HR 99 | Ht 71.0 in | Wt >= 6400 oz

## 2023-05-03 DIAGNOSIS — I10 Essential (primary) hypertension: Secondary | ICD-10-CM

## 2023-05-03 DIAGNOSIS — G4733 Obstructive sleep apnea (adult) (pediatric): Secondary | ICD-10-CM

## 2023-05-03 DIAGNOSIS — E1169 Type 2 diabetes mellitus with other specified complication: Secondary | ICD-10-CM

## 2023-05-03 DIAGNOSIS — Z1159 Encounter for screening for other viral diseases: Secondary | ICD-10-CM

## 2023-05-03 DIAGNOSIS — E785 Hyperlipidemia, unspecified: Secondary | ICD-10-CM

## 2023-05-03 DIAGNOSIS — E559 Vitamin D deficiency, unspecified: Secondary | ICD-10-CM

## 2023-05-03 DIAGNOSIS — K219 Gastro-esophageal reflux disease without esophagitis: Secondary | ICD-10-CM

## 2023-05-03 DIAGNOSIS — Z114 Encounter for screening for human immunodeficiency virus [HIV]: Secondary | ICD-10-CM

## 2023-05-03 MED ORDER — ATORVASTATIN CALCIUM 40 MG PO TABS: 40.0000 mg | ORAL_TABLET | Freq: Every day | ORAL | 3 refills | Status: DC

## 2023-05-03 MED ORDER — HYDROCHLOROTHIAZIDE 25 MG PO TABS: 25.0000 mg | ORAL_TABLET | Freq: Every day | ORAL | 3 refills | Status: DC

## 2023-05-03 MED ORDER — LOSARTAN POTASSIUM 50 MG PO TABS: 50.0000 mg | ORAL_TABLET | Freq: Every day | ORAL | 3 refills | Status: DC

## 2023-05-03 MED ORDER — AMLODIPINE BESYLATE 5 MG PO TABS: 5.0000 mg | ORAL_TABLET | Freq: Every day | ORAL | 3 refills | Status: DC

## 2023-05-03 NOTE — Assessment & Plan Note (Signed)
Lipid panel last updated in December 2023 reflects poor control.  Previously prescribed atorvastatin 40 mg daily but has been off medication recently.  Repeat lipid panel ordered today.  Atorvastatin refilled.

## 2023-05-03 NOTE — Assessment & Plan Note (Signed)
A1c 5.9 on labs from December 2023.  Previously diet controlled. -Repeat A1c ordered today

## 2023-05-03 NOTE — Patient Instructions (Signed)
It was a pleasure to see you today.  Thank you for giving [place] the opportunity to be involved in your care.  Below is a brief recap of your visit and next steps.  We will plan to see you again in 6 weeks.  Summary Medications refilled Repeat labs ordered Follow up in 6 weeks for BP check and lab review Will arrange pulmonology follow up

## 2023-05-03 NOTE — Assessment & Plan Note (Signed)
Severe OSA noted on PSG completed in January 2024.  Previously referred to sleep medicine but was unable to establish care.  Will attempt to arrange an appointment with pulmonology for evaluation in the near future.

## 2023-05-03 NOTE — Assessment & Plan Note (Signed)
BP is elevated today.  He is out of all medications currently.  Most recently prescribed antihypertensive regimen consists of amlodipine 5 mg daily, HCTZ 25 mg daily, and losartan 50 mg daily.  Previously well-controlled on this regimen. -Antihypertensive medications refilled today.  He was instructed to resume taking them on sequential days.  Follow-up in 6 weeks for BP check.

## 2023-05-03 NOTE — Assessment & Plan Note (Signed)
Current weight is 418 pounds.  BMI 58.3.  His acute concerns today is wanting to discuss medication options for weight loss.  He exercises at work but is not following any diet currently. -Repeat baseline labs ordered today -Lifestyle modifications aimed at weight loss were reviewed.  Through shared decision making he was referred to medical nutrition therapy to discuss appropriate dietary habits. -Follow-up in 6 weeks for reassessment

## 2023-05-03 NOTE — Progress Notes (Signed)
Established Patient Office Visit  Subjective   Patient ID: Bobby Bridges, male    DOB: 1975/09/17  Age: 48 y.o. MRN: [id number]  Chief Complaint  Patient presents with   Care Management    Follow up    Obesity    Discuss weight loss options   Bobby Bridges returns to care today for follow-up.  He was last evaluated by me in April 2024.  Pulmonology referral placed at that time in the setting of severe OSA.  Hydroxyzine was also resumed for treatment of insomnia.  48-month follow-up was arranged.  ED presentation in October 24 in the setting of URI symptoms.  There have otherwise been no acute interval events.  Bobby Bridges reports feeling well today.  He is asymptomatic currently and his acute concern is wanting to discuss medication options for weight loss.  He is exercising regularly but not following any particular diet.  Past Medical History:  Diagnosis Date   Anxiety    Asthma    Chronic back pain    Depression    GERD (gastroesophageal reflux disease)    Hearing loss in left ear    Hypertension    Seizures (HCC)    outgrew by age 89   Sleep apnea sleep apnea   History reviewed. No pertinent surgical history. Social History   Tobacco Use   Smoking status: Former    Current packs/day: 0.00    Average packs/day: 1 pack/day for 2.0 years (2.0 ttl pk-yrs)    Types: Cigarettes    Start date: 04/08/2010    Quit date: 04/08/2012    Years since quitting: 11.0   Smokeless tobacco: Never  Vaping Use   Vaping status: Never Used  Substance Use Topics   Alcohol use: No   Drug use: No   Family History  Problem Relation Age of Onset   Cancer Mother    Hypertension Mother    Cancer Father    Hypertension Father    Heart disease Other        "before 47 years old"   No Known Allergies  Review of Systems  Constitutional:  Negative for chills and fever.  HENT:  Negative for sore throat.   Respiratory:  Negative for cough and shortness of breath.    Cardiovascular:  Negative for chest pain, palpitations and leg swelling.  Gastrointestinal:  Negative for abdominal pain, blood in stool, constipation, diarrhea, nausea and vomiting.  Genitourinary:  Negative for dysuria and hematuria.  Musculoskeletal:  Negative for myalgias.  Skin:  Negative for itching and rash.  Neurological:  Negative for dizziness and headaches.  Psychiatric/Behavioral:  Negative for depression and suicidal ideas.      Objective:     BP (!) 163/96 (BP Location: Right Arm, Patient Position: Sitting, Cuff Size: Large)   Pulse 99   Ht 5\' 11"  (1.803 m)   Wt (!) 418 lb 9.6 oz (189.9 kg)   SpO2 96%   BMI 58.38 kg/m  BP Readings from Last 3 Encounters:  05/03/23 (!) 163/96  01/12/23 (!) 133/107  08/03/22 121/80   Physical Exam Vitals reviewed.  Constitutional:      General: He is not in acute distress.    Appearance: Normal appearance. He is obese. He is not ill-appearing.  HENT:     Head: Normocephalic and atraumatic.     Nose: Nose normal. No congestion or rhinorrhea.     Mouth/Throat:     Mouth: Mucous membranes are moist.     Pharynx:  Oropharynx is clear.  Eyes:     General: No scleral icterus.    Conjunctiva/sclera: Conjunctivae normal.     Pupils: Pupils are equal, round, and reactive to light.  Cardiovascular:     Rate and Rhythm: Normal rate and regular rhythm.     Pulses: Normal pulses.     Heart sounds: Normal heart sounds. No murmur heard. Pulmonary:     Effort: Pulmonary effort is normal.     Breath sounds: Normal breath sounds. No wheezing, rhonchi or rales.  Abdominal:     General: Abdomen is flat. Bowel sounds are normal. There is no distension.     Palpations: Abdomen is soft.     Tenderness: There is no abdominal tenderness.  Musculoskeletal:        General: No swelling or deformity. Normal range of motion.     Cervical back: Normal range of motion.  Skin:    General: Skin is warm and dry.     Capillary Refill: Capillary refill  takes less than 2 seconds.  Neurological:     General: No focal deficit present.     Mental Status: He is alert and oriented to person, place, and time.     Motor: No weakness.  Psychiatric:        Mood and Affect: Mood normal.        Behavior: Behavior normal.        Thought Content: Thought content normal.   Last CBC Lab Results  Component Value Date   WBC 5.6 03/19/2022   HGB 14.8 03/19/2022   HCT 46.1 03/19/2022   MCV 81 03/19/2022   MCH 26.0 (L) 03/19/2022   RDW 13.0 03/19/2022   PLT 289 03/19/2022   Last metabolic panel Lab Results  Component Value Date   GLUCOSE 100 (H) 03/19/2022   NA 142 03/19/2022   K 4.1 03/19/2022   CL 106 03/19/2022   CO2 24 03/19/2022   BUN 13 03/19/2022   CREATININE 0.91 03/19/2022   EGFR 106 03/19/2022   CALCIUM 9.0 03/19/2022   PROT 6.6 03/19/2022   ALBUMIN 3.7 (L) 03/19/2022   LABGLOB 2.9 03/19/2022   AGRATIO 1.3 03/19/2022   BILITOT 0.3 03/19/2022   ALKPHOS 105 03/19/2022   AST 21 03/19/2022   ALT 26 03/19/2022   ANIONGAP 5 09/05/2021   Last lipids Lab Results  Component Value Date   CHOL 203 (H) 03/19/2022   HDL 38 (L) 03/19/2022   LDLCALC 153 (H) 03/19/2022   TRIG 64 03/19/2022   CHOLHDL 5.3 (H) 03/19/2022   Last hemoglobin A1c Lab Results  Component Value Date   HGBA1C 5.9 (H) 03/19/2022   Last thyroid functions Lab Results  Component Value Date   TSH 3.170 03/19/2022   Last vitamin D Lab Results  Component Value Date   VD25OH 7.5 (L) 08/03/2022   Last vitamin B12 and Folate Lab Results  Component Value Date   VITAMINB12 307 03/19/2022   FOLATE 8.8 03/19/2022   The 10-year ASCVD risk score (Arnett DK, et al., 2019) is: 21.9%    Assessment & Plan:   Problem List Items Addressed This Visit       Hypertension   BP is elevated today.  He is out of all medications currently.  Most recently prescribed antihypertensive regimen consists of amlodipine 5 mg daily, HCTZ 25 mg daily, and losartan 50 mg daily.   Previously well-controlled on this regimen. -Antihypertensive medications refilled today.  He was instructed to resume taking them on sequential  days.  Follow-up in 6 weeks for BP check.      Severe obstructive sleep apnea   Severe OSA noted on PSG completed in January 2024.  Previously referred to sleep medicine but was unable to establish care.  Will attempt to arrange an appointment with pulmonology for evaluation in the near future.      Hyperlipidemia associated with type 2 diabetes mellitus (HCC)   Lipid panel last updated in December 2023 reflects poor control.  Previously prescribed atorvastatin 40 mg daily but has been off medication recently.  Repeat lipid panel ordered today.  Atorvastatin refilled.      Type 2 diabetes mellitus with other specified complication (HCC) - Primary   A1c 5.9 on labs from December 2023.  Previously diet controlled. -Repeat A1c ordered today      Morbid obesity (HCC)   Current weight is 418 pounds.  BMI 58.3.  His acute concerns today is wanting to discuss medication options for weight loss.  He exercises at work but is not following any diet currently. -Repeat baseline labs ordered today -Lifestyle modifications aimed at weight loss were reviewed.  Through shared decision making he was referred to medical nutrition therapy to discuss appropriate dietary habits. -Follow-up in 6 weeks for reassessment       Return in about 6 weeks (around 06/14/2023) for HTN, lab review, obesity.   Billie Lade, MD

## 2023-05-04 ENCOUNTER — Encounter: Payer: Self-pay | Admitting: Internal Medicine

## 2023-05-05 LAB — CMP14+EGFR
ALT: 21 [IU]/L (ref 0–44)
AST: 17 [IU]/L (ref 0–40)
Albumin: 3.7 g/dL — ABNORMAL LOW (ref 4.1–5.1)
Alkaline Phosphatase: 111 [IU]/L (ref 44–121)
BUN/Creatinine Ratio: 19 (ref 9–20)
BUN: 20 mg/dL (ref 6–24)
Bilirubin Total: 0.4 mg/dL (ref 0.0–1.2)
CO2: 22 mmol/L (ref 20–29)
Calcium: 9.1 mg/dL (ref 8.7–10.2)
Chloride: 106 mmol/L (ref 96–106)
Creatinine, Ser: 1.08 mg/dL (ref 0.76–1.27)
Globulin, Total: 3.1 g/dL (ref 1.5–4.5)
Glucose: 105 mg/dL — ABNORMAL HIGH (ref 70–99)
Potassium: 4.2 mmol/L (ref 3.5–5.2)
Sodium: 141 mmol/L (ref 134–144)
Total Protein: 6.8 g/dL (ref 6.0–8.5)
eGFR: 86 mL/min/{1.73_m2} (ref >59)

## 2023-05-05 LAB — CBC WITH DIFFERENTIAL/PLATELET
Basophils Absolute: 0 10*3/uL (ref 0.0–0.2)
Basos: 1 %
EOS (ABSOLUTE): 0.1 10*3/uL (ref 0.0–0.4)
Eos: 1 %
Hematocrit: 45.9 % (ref 37.5–51.0)
Hemoglobin: 15 g/dL (ref 13.0–17.7)
Immature Grans (Abs): 0 10*3/uL (ref 0.0–0.1)
Immature Granulocytes: 0 %
Lymphocytes Absolute: 2 10*3/uL (ref 0.7–3.1)
Lymphs: 36 %
MCH: 26.8 pg (ref 26.6–33.0)
MCHC: 32.7 g/dL (ref 31.5–35.7)
MCV: 82 fL (ref 79–97)
Monocytes Absolute: 0.5 10*3/uL (ref 0.1–0.9)
Monocytes: 8 %
Neutrophils Absolute: 3.1 10*3/uL (ref 1.4–7.0)
Neutrophils: 54 %
Platelets: 277 10*3/uL (ref 150–450)
RBC: 5.6 x10E6/uL (ref 4.14–5.80)
RDW: 13.3 % (ref 11.6–15.4)
WBC: 5.6 10*3/uL (ref 3.4–10.8)

## 2023-05-05 LAB — LIPID PANEL
Chol/HDL Ratio: 5.6 {ratio} — ABNORMAL HIGH (ref 0.0–5.0)
Cholesterol, Total: 206 mg/dL — ABNORMAL HIGH (ref 100–199)
HDL: 37 mg/dL — ABNORMAL LOW (ref >39)
LDL Chol Calc (NIH): 151 mg/dL — ABNORMAL HIGH (ref 0–99)
Triglycerides: 100 mg/dL (ref 0–149)
VLDL Cholesterol Cal: 18 mg/dL (ref 5–40)

## 2023-05-05 LAB — B12 AND FOLATE PANEL
Folate: 8.9 ng/mL (ref >3.0)
Vitamin B-12: 280 pg/mL (ref 232–1245)

## 2023-05-05 LAB — TSH+FREE T4
Free T4: 1.22 ng/dL (ref 0.82–1.77)
TSH: 2.82 u[IU]/mL (ref 0.450–4.500)

## 2023-05-05 LAB — HIV ANTIBODY (ROUTINE TESTING W REFLEX)

## 2023-05-05 LAB — VITAMIN D 25 HYDROXY (VIT D DEFICIENCY, FRACTURES): Vit D, 25-Hydroxy: 11.9 ng/mL — ABNORMAL LOW (ref 30.0–100.0)

## 2023-05-05 LAB — HEMOGLOBIN A1C
Est. average glucose Bld gHb Est-mCnc: 123 mg/dL
Hgb A1c MFr Bld: 5.9 % — ABNORMAL HIGH (ref 4.8–5.6)

## 2023-05-05 LAB — HCV INTERPRETATION

## 2023-05-05 LAB — HCV AB W REFLEX TO QUANT PCR: HCV Ab: NONREACTIVE

## 2023-05-06 NOTE — Telephone Encounter (Signed)
Spoke to patient

## 2023-05-06 NOTE — Telephone Encounter (Unsigned)
Copied from CRM [id number]. Topic: Clinical - Medication Question >> May 06, 2023  9:39 AM Ivette P wrote: Reason for CRM: Pt is taking medication prescribed by DR. Dixon and is requesting the generic brand. Does not know which ones and has further quesitons Requesting callback [phone].

## 2023-05-06 NOTE — Telephone Encounter (Signed)
Spoke with pt advised him to let the pharmacy know so they can send a request on which ones need to be generic

## 2023-05-07 NOTE — Telephone Encounter (Unsigned)
Copied from CRM [id number]. Topic: Clinical - Prescription Issue >> May 07, 2023 12:28 PM Geroge Baseman wrote: Reason for CRM: Patient just went to pick up new medications and and he said his insurance doesn't kick in until February. Patient wants to know if he can have generics sent over for all 4 medications so that may be cheaper.

## 2023-05-07 NOTE — Telephone Encounter (Signed)
Left detailed message for patient, all medications are on generic

## 2023-05-21 ENCOUNTER — Telehealth: Payer: Self-pay | Admitting: Internal Medicine

## 2023-05-21 NOTE — Telephone Encounter (Signed)
Copied from CRM [id number]. Topic: General - Other >> May 21, 2023  1:25 PM Bobby Bridges wrote: Reason for CRM: Patient wanted to let Dr. Durwin Nora know he will not be able to get his prescriptions until 05/31/23 due to insurance kicking in. I also advised patient to let pharmacy know of updated insurance.

## 2023-05-21 NOTE — Telephone Encounter (Signed)
Lvm

## 2023-05-21 NOTE — Telephone Encounter (Unsigned)
Copied from CRM [id number]. Topic: Clinical - Prescription Issue >> May 20, 2023  1:43 PM Carlatta H wrote: Reason for CRM: Patient would like a call back from the nurse regarding his prescriptions//he stated his insurance just kicked in and he would be able to get medicine until Friday

## 2023-06-06 ENCOUNTER — Other Ambulatory Visit: Payer: Self-pay | Admitting: Internal Medicine

## 2023-06-06 DIAGNOSIS — J4520 Mild intermittent asthma, uncomplicated: Secondary | ICD-10-CM

## 2023-06-06 DIAGNOSIS — I10 Essential (primary) hypertension: Secondary | ICD-10-CM

## 2023-06-06 DIAGNOSIS — E1169 Type 2 diabetes mellitus with other specified complication: Secondary | ICD-10-CM

## 2023-06-06 MED ORDER — AMLODIPINE BESYLATE 5 MG PO TABS: 5.0000 mg | ORAL_TABLET | Freq: Every day | ORAL | 3 refills | Status: DC

## 2023-06-06 MED ORDER — LOSARTAN POTASSIUM 50 MG PO TABS: 50.0000 mg | ORAL_TABLET | Freq: Every day | ORAL | 3 refills | Status: DC

## 2023-06-06 MED ORDER — ATORVASTATIN CALCIUM 40 MG PO TABS: 40.0000 mg | ORAL_TABLET | Freq: Every day | ORAL | 3 refills | Status: DC

## 2023-06-06 MED ORDER — ALBUTEROL SULFATE HFA 108 (90 BASE) MCG/ACT IN AERS: 2.0000 | INHALATION_SPRAY | Freq: Four times a day (QID) | RESPIRATORY_TRACT | 0 refills | Status: DC | PRN

## 2023-06-06 MED ORDER — HYDROCHLOROTHIAZIDE 25 MG PO TABS: 25.0000 mg | ORAL_TABLET | Freq: Every day | ORAL | 3 refills | Status: DC

## 2023-06-06 NOTE — Telephone Encounter (Signed)
 Refills ordered today 2/27.

## 2023-06-06 NOTE — Telephone Encounter (Signed)
 Copied from CRM [id number]. Topic: Clinical - Medication Refill >> Jun 06, 2023  9:58 AM Antwanette L wrote: Most Recent Primary Care Visit:  Provider: Christel Mormon E  Department: [facility]  Visit Type: OFFICE VISIT  Date: 05/03/2023  Medication: albuterol (VENTOLIN HFA) 108 (90 Base) MCG/ACT inhaler, amLODipine (NORVASC) 5 MG tablet,atorvastatin (LIPITOR) 40 MG tablet,hydrochlorothiazide (HYDRODIURIL) 25 MG tablet, and losartan (COZAAR) 50 MG tablet   Has the patient contacted their pharmacy? Yes   Is this the correct pharmacy for this prescription? Yes  This is the patient's preferred pharmacy:  [facility] [address], [place] - [address] [place] [address] Phone: [phone] Fax: [phone]   Has the prescription been filled recently? No  Is the patient out of the medication? Yes  Has the patient been seen for an appointment in the last year OR does the patient have an upcoming appointment? Yes  Can we respond through MyChart? No. Please contact patient by phone. The Albuterol was prescribed by Edwin Dada at the [facility]. Patient no longer sees her but he needs a refill on this medicine.  Agent: Please be advised that Rx refills may take up to 3 business days. We ask that you follow-up with your pharmacy.

## 2023-06-06 NOTE — Telephone Encounter (Signed)
 Copied from CRM [id number]. Topic: Clinical - Medication Refill >> Jun 06, 2023 10:05 AM Antwanette L wrote: Most Recent Primary Care Visit:  Provider: Christel Mormon E  Department: [facility]  Visit Type: OFFICE VISIT  Date: 05/03/2023     Medication: albuterol (VENTOLIN HFA) 108 (90 Base) MCG/ACT inhaler, amLODipine (NORVASC) 5 MG tablet,atorvastatin (LIPITOR) 40 MG tablet,hydrochlorothiazide (HYDRODIURIL) 25 MG tablet, and losartan (COZAAR) 50 MG tablet   Has the patient contacted their pharmacy? Yes   Is this the correct pharmacy for this prescription? Yes  This is the patient's preferred pharmacy:  [facility] [address], [place] - [address]  [address]  [place] [address]  Phone: [phone] Fax: [phone]      Has the prescription been filled recently? No    Is the patient out of the medication? Yes   Has the patient been seen for an appointment in the last year OR does the patient have an upcoming appointment? Yes    Can we respond through MyChart? No. Please contact patient by phone. The Albuterol was prescribed by Edwin Dada at the [facility]. Patient no longer sees her but he needs a refill on this medicine.    Agent: Please be advised that Rx refills may take up to 3 business days. We ask that you follow-up with your pharmacy.

## 2023-06-10 ENCOUNTER — Encounter: Payer: Self-pay | Attending: Internal Medicine | Admitting: Nutrition

## 2023-06-10 VITALS — Ht 71.0 in | Wt >= 6400 oz

## 2023-06-10 DIAGNOSIS — E1169 Type 2 diabetes mellitus with other specified complication: Secondary | ICD-10-CM | POA: Diagnosis present

## 2023-06-10 DIAGNOSIS — E782 Mixed hyperlipidemia: Secondary | ICD-10-CM | POA: Insufficient documentation

## 2023-06-10 NOTE — Patient Instructions (Signed)
 Goals  Cut out sodas Drink only water Choose Healthy Choice Meals Walk 30 minutes every day. Lose 1 lb per week.

## 2023-06-10 NOTE — Progress Notes (Signed)
 Medical Nutrition Therapy  Appointment Start time:  1430  Appointment End time:  1530  Primary concerns today: Hyperlipidemia , Severe obesity Referral diagnosis: E78, E66.01 Preferred learning style: No preference Learning readiness: Ready   NUTRITION ASSESSMENT  48 yr old bmale referred for severe obesity. HIghest weight 540 lbs about 3 years ago. Diet controlled Type 2 DM. A1C now 5.9%. PMH reports Type 2 DM, but unknown what his A1C was at time of diagnosis. PCP Dr. Durwin Nora. Has sleep apnea. Has Hyperlipidemia. Willing to work on his diet to improve it rather than medications. He had lost from 540 lbs down to 420 lbs in the last few years. Did walking to lose weight. Had been homeless in the past. He has a 9 yr old son that lives with is sister. 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 He is much more active now. Feels better about his life and wants to get healthier.   He is willing to work on cutting out processed foods and increasing more fruits, vegetables and whole grains. Willing to work on being more active also.   Clinical  Medical Hx:  Past Medical History:  Diagnosis Date   Anxiety    Asthma    Chronic back pain    Depression    GERD (gastroesophageal reflux disease)    Hearing loss in left ear    Hypertension    Seizures (HCC)    outgrew by age 41   Sleep apnea sleep apnea    Medications:  Current Outpatient Medications on File Prior to Visit  Medication Sig Dispense Refill   albuterol (VENTOLIN HFA) 108 (90 Base) MCG/ACT inhaler Inhale 2 puffs into the lungs every 6 (six) hours as needed for wheezing or shortness of breath. 8 g 0   amLODipine (NORVASC) 5 MG tablet Take 1 tablet (5 mg total) by mouth daily. 90 tablet 3   atorvastatin (LIPITOR) 40 MG tablet Take 1 tablet (40 mg total) by mouth daily. 90 tablet 3   hydrochlorothiazide (HYDRODIURIL) 25 MG tablet Take 1 tablet (25 mg total) by mouth daily. 90 tablet 3   losartan (COZAAR) 50 MG tablet Take 1  tablet (50 mg total) by mouth daily. 90 tablet 3   No current facility-administered medications on file prior to visit.    Labs:  Lab Results  Component Value Date   HGBA1C 5.9 (H) 05/03/2023      Latest Ref Rng & Units 05/03/2023    9:05 AM 03/19/2022    8:37 AM 09/05/2021    8:15 AM  CMP  Glucose 70 - 99 mg/dL 161  096  045   BUN 6 - 24 mg/dL 20  13  13    Creatinine 0.76 - 1.27 mg/dL 4.09  8.11  9.14   Sodium 134 - 144 mmol/L 141  142  140   Potassium 3.5 - 5.2 mmol/L 4.2  4.1  3.9   Chloride 96 - 106 mmol/L 106  106  110   CO2 20 - 29 mmol/L 22  24  25    Calcium 8.7 - 10.2 mg/dL 9.1  9.0  8.8   Total Protein 6.0 - 8.5 g/dL 6.8  6.6    Total Bilirubin 0.0 - 1.2 mg/dL 0.4  0.3    Alkaline Phos 44 - 121 IU/L 111  105    AST 0 - 40 IU/L 17  21    ALT 0 - 44 IU/L 21  26     Lipid Panel  Component Value Date/Time   CHOL 206 (H) 05/03/2023 0905   TRIG 100 05/03/2023 0905   HDL 37 (L) 05/03/2023 0905   CHOLHDL 5.6 (H) 05/03/2023 0905   CHOLHDL 5.6 09/05/2021 0815   VLDL 15 09/05/2021 0815   LDLCALC 151 (H) 05/03/2023 0905   LABVLDL 18 05/03/2023 0905    Notable Signs/Symptoms: None  Lifestyle & Dietary Hx LIves by himself. Has a 66 yr old son who lives with his sister  Estimated daily fluid intake: 60 oz Supplements:  Sleep: Sleep apnea - go get re evaluated by Sleep center Stress / self-care:  Current average weekly physical activity: Walks some  24-Hr Dietary Recall Eats 2-3 meals per day. Trying to make better choices. Working on cutting out processed foods and cooking more at home.  Estimated Energy Needs Calories: 1800 Carbohydrate: 200 g Protein: 135 g Fat: 50 g   NUTRITION DIAGNOSIS  NI-1.7 Predicted excessive energy intake As related to high calorie diet.  As evidenced by 420 lbs, BMI 58. Marland Kitchen   NUTRITION INTERVENTION  Nutrition education (E-1) on the following topics:  Nutrition and Diabetes education provided on My Plate, CHO counting, meal  planning, portion sizes, timing of meals, avoiding snacks between meals unless having a low blood sugar, target ranges for A1C and blood sugars, signs/symptoms and treatment of hyper/hypoglycemia, monitoring blood sugars, taking medications as prescribed, benefits of exercising 30 minutes per day and prevention of complications of DM.  Lifestyle Medicine  - Whole Food, Plant Predominant Nutrition is highly recommended: Eat Plenty of vegetables, Mushrooms, fruits, Legumes, Whole Grains, Nuts, seeds in lieu of processed meats, processed snacks/pastries red meat, poultry, eggs.    -It is better to avoid simple carbohydrates including: Cakes, Sweet Desserts, Ice Cream, Soda (diet and regular), Sweet Tea, Candies, Chips, Cookies, Store Bought Juices, Alcohol in Excess of  1-2 drinks a day, Lemonade,  Artificial Sweeteners, Doughnuts, Coffee Creamers, "Sugar-free" Products, etc, etc.  This is not a complete list.....  Exercise: If you are able: 30 -60 minutes a day ,4 days a week, or 150 minutes a week.  The longer the better.  Combine stretch, strength, and aerobic activities.  If you were told in the past that you have high risk for cardiovascular diseases, you may seek evaluation by your heart doctor prior to initiating moderate to intense exercise programs.   Handouts Provided Include  Lifestyle Medicine handouts   Learning Style & Readiness for Change Teaching method utilized: Visual & Auditory  Demonstrated degree of understanding via: Teach Back  Barriers to learning/adherence to lifestyle change: none  Goals Established by Pt Goals  Cut out sodas Drink only water Choose Healthy Choice Meals Walk 30 minutes every day. Lose 1 lb per week.   MONITORING & EVALUATION Dietary intake, weekly physical activity, and weight in 1 month.  Next Steps  Patient is to work on weight loss.Marland Kitchen

## 2023-06-17 ENCOUNTER — Ambulatory Visit: Payer: 59 | Admitting: Internal Medicine

## 2023-06-19 ENCOUNTER — Encounter: Payer: Self-pay | Admitting: Nutrition

## 2023-07-08 ENCOUNTER — Emergency Department (HOSPITAL_COMMUNITY)
Admission: EM | Admit: 2023-07-08 | Discharge: 2023-07-08 | Disposition: A | Discharge status: Home / Self Care | Attending: Emergency Medicine | Admitting: Emergency Medicine

## 2023-07-08 ENCOUNTER — Other Ambulatory Visit: Payer: Self-pay

## 2023-07-08 ENCOUNTER — Emergency Department (HOSPITAL_COMMUNITY)

## 2023-07-08 ENCOUNTER — Encounter (HOSPITAL_COMMUNITY): Payer: Self-pay

## 2023-07-08 DIAGNOSIS — R059 Cough, unspecified: Secondary | ICD-10-CM | POA: Diagnosis present

## 2023-07-08 DIAGNOSIS — J069 Acute upper respiratory infection, unspecified: Secondary | ICD-10-CM

## 2023-07-08 DIAGNOSIS — J45909 Unspecified asthma, uncomplicated: Secondary | ICD-10-CM | POA: Insufficient documentation

## 2023-07-08 DIAGNOSIS — Z7951 Long term (current) use of inhaled steroids: Secondary | ICD-10-CM | POA: Insufficient documentation

## 2023-07-08 LAB — CBC WITH DIFFERENTIAL/PLATELET
Abs Immature Granulocytes: 0.03 10*3/uL (ref 0.00–0.07)
Basophils Absolute: 0 10*3/uL (ref 0.0–0.1)
Basophils Relative: 0 %
Eosinophils Absolute: 0.2 10*3/uL (ref 0.0–0.5)
Eosinophils Relative: 3 %
HCT: 44.7 % (ref 39.0–52.0)
Hemoglobin: 13.9 g/dL (ref 13.0–17.0)
Immature Granulocytes: 0 %
Lymphocytes Relative: 31 %
Lymphs Abs: 2.1 10*3/uL (ref 0.7–4.0)
MCH: 26.8 pg (ref 26.0–34.0)
MCHC: 31.1 g/dL (ref 30.0–36.0)
MCV: 86.1 fL (ref 80.0–100.0)
Monocytes Absolute: 0.6 10*3/uL (ref 0.1–1.0)
Monocytes Relative: 9 %
Neutro Abs: 3.9 10*3/uL (ref 1.7–7.7)
Neutrophils Relative %: 57 %
Platelets: 269 10*3/uL (ref 150–400)
RBC: 5.19 MIL/uL (ref 4.22–5.81)
RDW: 13.3 % (ref 11.5–15.5)
WBC: 6.8 10*3/uL (ref 4.0–10.5)
nRBC: 0 % (ref 0.0–0.2)

## 2023-07-08 LAB — RESP PANEL BY RT-PCR (RSV, FLU A&B, COVID)  RVPGX2
Influenza A by PCR: NEGATIVE
Influenza B by PCR: NEGATIVE
Resp Syncytial Virus by PCR: NEGATIVE
SARS Coronavirus 2 by RT PCR: NEGATIVE

## 2023-07-08 LAB — TROPONIN I (HIGH SENSITIVITY): Troponin I (High Sensitivity): 5 ng/L (ref <18)

## 2023-07-08 LAB — COMPREHENSIVE METABOLIC PANEL WITH GFR
ALT: 27 U/L (ref 0–44)
AST: 27 U/L (ref 15–41)
Albumin: 3.2 g/dL — ABNORMAL LOW (ref 3.5–5.0)
Alkaline Phosphatase: 81 U/L (ref 38–126)
Anion gap: 8 (ref 5–15)
BUN: 14 mg/dL (ref 6–20)
CO2: 26 mmol/L (ref 22–32)
Calcium: 8.9 mg/dL (ref 8.9–10.3)
Chloride: 106 mmol/L (ref 98–111)
Creatinine, Ser: 0.98 mg/dL (ref 0.61–1.24)
GFR, Estimated: >60 mL/min (ref >60)
Glucose, Bld: 96 mg/dL (ref 70–99)
Potassium: 3.4 mmol/L — ABNORMAL LOW (ref 3.5–5.1)
Sodium: 140 mmol/L (ref 135–145)
Total Bilirubin: 0.6 mg/dL (ref 0.0–1.2)
Total Protein: 7 g/dL (ref 6.5–8.1)

## 2023-07-08 LAB — BRAIN NATRIURETIC PEPTIDE: B Natriuretic Peptide: 26 pg/mL (ref 0.0–100.0)

## 2023-07-08 LAB — CBG MONITORING, ED: Glucose-Capillary: 122 mg/dL — ABNORMAL HIGH (ref 70–99)

## 2023-07-08 MED ORDER — PREDNISONE 50 MG PO TABS
60.0000 mg | ORAL_TABLET | Freq: Once | ORAL | Status: AC
  Administered 2023-07-08: 60 mg via ORAL
  Filled 2023-07-08: qty 1

## 2023-07-08 MED ORDER — IPRATROPIUM-ALBUTEROL 0.5-2.5 (3) MG/3ML IN SOLN
3.0000 mL | Freq: Once | RESPIRATORY_TRACT | Status: AC
  Administered 2023-07-08: 3 mL via RESPIRATORY_TRACT
  Filled 2023-07-08: qty 3

## 2023-07-08 MED ORDER — PREDNISONE 10 MG PO TABS: 40.0000 mg | ORAL_TABLET | Freq: Every day | ORAL | 0 refills | Status: AC

## 2023-07-08 MED ORDER — ALBUTEROL SULFATE HFA 108 (90 BASE) MCG/ACT IN AERS: 1.0000 | INHALATION_SPRAY | Freq: Four times a day (QID) | RESPIRATORY_TRACT | 0 refills | Status: DC | PRN

## 2023-07-08 MED ORDER — DOXYCYCLINE HYCLATE 100 MG PO CAPS
100.0000 mg | ORAL_CAPSULE | Freq: Two times a day (BID) | ORAL | 0 refills | Status: DC
Start: 2023-07-08 — End: 2023-07-23

## 2023-07-08 NOTE — ED Triage Notes (Signed)
 Pt c/o cough for over a week. Pt denies n/v/d. Pt states he uses albuterol inhaler and seems to help a little bit. Pt states when trying to sleep at night he hears himself wheezing.

## 2023-07-08 NOTE — ED Notes (Signed)
Cbg 122

## 2023-07-08 NOTE — ED Provider Notes (Signed)
 [place] EMERGENCY DEPARTMENT AT [facility] Provider Note   CSN: [id number] Arrival date & time: 07/08/23  4098     History  Chief Complaint  Patient presents with   Cough    Bobby Bridges is a 48 y.o. male.  Patient is a 48 year old male who presents emergency department the chief complaint of cough, congestion, wheezing which has been ongoing for approximate the past week.  He notes that symptoms do improve with his albuterol inhaler.  He notes the cough has been productive in nature.  He denies any associated fever, chills, chest pain.  He denies any abdominal pain, nausea, vomiting, diarrhea.  He denies any other known sick contacts.   Cough      Home Medications Prior to Admission medications   Medication Sig Start Date End Date Taking? Authorizing Provider  albuterol (VENTOLIN HFA) 108 (90 Base) MCG/ACT inhaler Inhale 2 puffs into the lungs every 6 (six) hours as needed for wheezing or shortness of breath. 06/06/23   Billie Lade, MD  amLODipine (NORVASC) 5 MG tablet Take 1 tablet (5 mg total) by mouth daily. 06/06/23   Billie Lade, MD  atorvastatin (LIPITOR) 40 MG tablet Take 1 tablet (40 mg total) by mouth daily. 06/06/23   Billie Lade, MD  hydrochlorothiazide (HYDRODIURIL) 25 MG tablet Take 1 tablet (25 mg total) by mouth daily. 06/06/23   Billie Lade, MD  losartan (COZAAR) 50 MG tablet Take 1 tablet (50 mg total) by mouth daily. 06/06/23 09/04/23  Billie Lade, MD      Allergies    Patient has no known allergies.    Review of Systems   Review of Systems  Respiratory:  Positive for cough.   All other systems reviewed and are negative.   Physical Exam Updated Vital Signs BP (!) 125/98 (BP Location: Right Wrist)   Pulse 88   Temp 98.2 F (36.8 C) (Oral)   Resp 20   Ht 5\' 11"  (1.803 m)   Wt (!) 181.4 kg   SpO2 98%   BMI 55.79 kg/m  Physical Exam Vitals and nursing note reviewed.  Constitutional:      Appearance:  Normal appearance.  HENT:     Head: Normocephalic and atraumatic.     Nose: Nose normal.     Mouth/Throat:     Mouth: Mucous membranes are moist.  Eyes:     Extraocular Movements: Extraocular movements intact.     Conjunctiva/sclera: Conjunctivae normal.     Pupils: Pupils are equal, round, and reactive to light.  Cardiovascular:     Rate and Rhythm: Normal rate and regular rhythm.     Pulses: Normal pulses.     Heart sounds: Normal heart sounds. No murmur heard.    No gallop.  Pulmonary:     Effort: Pulmonary effort is normal. No respiratory distress.     Breath sounds: No stridor. Wheezing present. No rhonchi or rales.  Abdominal:     General: Abdomen is flat. Bowel sounds are normal. There is no distension.     Palpations: Abdomen is soft.     Tenderness: There is no abdominal tenderness. There is no guarding.  Musculoskeletal:        General: Normal range of motion.     Cervical back: Normal range of motion and neck supple.  Skin:    General: Skin is warm and dry.     Findings: No rash.  Neurological:     General: No focal  deficit present.     Mental Status: He is alert and oriented to person, place, and time. Mental status is at baseline.  Psychiatric:        Mood and Affect: Mood normal.        Behavior: Behavior normal.        Thought Content: Thought content normal.        Judgment: Judgment normal.     ED Results / Procedures / Treatments   Labs (all labs ordered are listed, but only abnormal results are displayed) Labs Reviewed  CBG MONITORING, ED - Abnormal; Notable for the following components:      Result Value   Glucose-Capillary 122 (*)    All other components within normal limits  RESP PANEL BY RT-PCR (RSV, FLU A&B, COVID)  [id number]    EKG None  Radiology No results found.  Procedures Procedures    Medications Ordered in ED Medications  ipratropium-albuterol (DUONEB) 0.5-2.5 (3) MG/3ML nebulizer solution 3 mL (has no administration in time  range)  predniSONE (DELTASONE) tablet 60 mg (has no administration in time range)    ED Course/ Medical Decision Making/ A&P                                 Medical Decision Making Amount and/or Complexity of Data Reviewed Labs: ordered. Radiology: ordered.  Risk Prescription drug management.   This patient presents to the ED for concern of cough, congestion, wheezing differential diagnosis includes sepsis, pneumonia, acute viral syndrome, CHF    Additional history obtained:  Additional history obtained from none External records from outside source obtained and reviewed including none   Lab Tests:  I Ordered, and personally interpreted labs.  The pertinent results include: No leukocytosis, no anemia, normal troponin, normal BNP, normal kidney function liver function, normal glucose   Imaging Studies ordered:  I ordered imaging studies including chest x-ray I independently visualized and interpreted imaging which showed vascular congestion, cardiomegaly I agree with the radiologist interpretation   Medicines ordered and prescription drug management:  I ordered medication including DuoNeb, prednisone for asthma exacerbation Reevaluation of the patient after these medicines showed that the patient improved I have reviewed the patients home medicines and have made adjustments as needed   Problem List / ED Course:  Patient does remain stable at this time.  Discussed with patient we will plan for discharge home.  Chest x-ray did show vascular congestion but patient does not clinically appear to be fluid overloaded and do not suspect acute CHF.  Patient will follow-up with his primary care doctor regarding this.  Suspect acute bronchitis at this point and will treat accordingly on an outpatient basis.  He does have stable vital signs with no indication for sepsis and no associated hypoxia.  Strict turn precautions were provided for any new or worsening symptoms.  Patient  voiced understanding and had no additional questions.   Social Determinants of Health:             Final Clinical Impression(s) / ED Diagnoses Final diagnoses:  None    Rx / DC Orders ED Discharge Orders     None         Kathlen Mody 07/08/23 1256    Pricilla Loveless, MD 07/08/23 1500

## 2023-07-08 NOTE — Discharge Instructions (Addendum)
 Please follow-up closely with your primary care doctor on an outpatient basis for a recheck.  Return to emergency department immediately for any new or worsening symptoms.  Please start taking the prednisone tomorrow.  May take other medications today.

## 2023-07-15 ENCOUNTER — Ambulatory Visit: Admitting: Nutrition

## 2023-07-22 ENCOUNTER — Encounter: Payer: Self-pay | Admitting: Nutrition

## 2023-07-22 ENCOUNTER — Encounter: Attending: Internal Medicine | Admitting: Nutrition

## 2023-07-22 VITALS — Ht 71.0 in | Wt >= 6400 oz

## 2023-07-22 DIAGNOSIS — E1169 Type 2 diabetes mellitus with other specified complication: Secondary | ICD-10-CM | POA: Insufficient documentation

## 2023-07-22 DIAGNOSIS — E782 Mixed hyperlipidemia: Secondary | ICD-10-CM | POA: Insufficient documentation

## 2023-07-22 NOTE — Patient Instructions (Addendum)
 Goals Quit soda 100% by birthday of [birth year]. Look into joining PT and talk to trainer about a routine. Work on eating a piece of fruit and vegetable with lunch and dinner daily. Change to vinaigrette dressing Lose 1 lb per week. Goal weight by next visit is 414 lbs

## 2023-07-22 NOTE — Progress Notes (Signed)
 Medical Nutrition Therapy  Appointment Start time:  1430  Appointment End time:  1530  Primary concerns today: Hyperlipidemia , Severe obesity Referral diagnosis: E78, E66.01 Preferred learning style: No preference Learning readiness: Ready   NUTRITION ASSESSMENT  48 yr old bmale referred for severe obesity. Still struggles with drinking sodas ans sugary beverages as well as fried foods. Drinking 1-2 sodas a day. Wants to quit. No significant weight loss since last visit. Is walking at work and trying to be more active.  Still eating late at night. Has tried some of the Healthy Choice Meals. Wants to find a Systems analyst. Willing to join [employer].  Goals et last time:  Cut out sodas- working on it. Drink only water- working on it. Likes lemon water Choose Healthy Choice Meals- done. Walk 30 minutes every day.- has been walking more. Lose 1 lb per week. Lost 1/2 lb in the last 5 weeks. Clinical Wt Readings from Last 3 Encounters:  07/22/23 (!) 419 lb 9.6 oz (190.3 kg)  07/08/23 (!) 400 lb (181.4 kg)  06/10/23 (!) 420 lb (190.5 kg)   Ht Readings from Last 3 Encounters:  07/22/23 5\' 11"  (1.803 m)  07/08/23 5\' 11"  (1.803 m)  06/10/23 5\' 11"  (1.803 m)   Body mass index is 58.52 kg/m. @BMIFA @ Facility age limit for growth %iles is 20 years. Facility age limit for growth %iles is 20 years.  Medical Hx:  Past Medical History:  Diagnosis Date   Anxiety    Asthma    Chronic back pain    Depression    GERD (gastroesophageal reflux disease)    Hearing loss in left ear    Hypertension    Seizures (HCC)    outgrew by age 84   Sleep apnea sleep apnea    Medications:  Current Outpatient Medications on File Prior to Visit  Medication Sig Dispense Refill   albuterol (VENTOLIN HFA) 108 (90 Base) MCG/ACT inhaler Inhale 1-2 puffs into the lungs every 6 (six) hours as needed for wheezing or shortness of breath. 1 each 0   amLODipine (NORVASC) 5 MG tablet Take 1 tablet  (5 mg total) by mouth daily. 90 tablet 3   atorvastatin (LIPITOR) 40 MG tablet Take 1 tablet (40 mg total) by mouth daily. 90 tablet 3   doxycycline (VIBRAMYCIN) 100 MG capsule Take 1 capsule (100 mg total) by mouth 2 (two) times daily. 20 capsule 0   hydrochlorothiazide (HYDRODIURIL) 25 MG tablet Take 1 tablet (25 mg total) by mouth daily. 90 tablet 3   losartan (COZAAR) 50 MG tablet Take 1 tablet (50 mg total) by mouth daily. 90 tablet 3   No current facility-administered medications on file prior to visit.    Labs:  Lab Results  Component Value Date   HGBA1C 5.9 (H) 05/03/2023      Latest Ref Rng & Units 07/08/2023   11:00 AM 05/03/2023    9:05 AM 03/19/2022    8:37 AM  CMP  Glucose 70 - 99 mg/dL 96  409  811   BUN 6 - 20 mg/dL 14  20  13    Creatinine 0.61 - 1.24 mg/dL 9.14  7.82  9.56   Sodium 135 - 145 mmol/L 140  141  142   Potassium 3.5 - 5.1 mmol/L 3.4  4.2  4.1   Chloride 98 - 111 mmol/L 106  106  106   CO2 22 - 32 mmol/L 26  22  24    Calcium 8.9 - 10.3  mg/dL 8.9  9.1  9.0   Total Protein 6.5 - 8.1 g/dL 7.0  6.8  6.6   Total Bilirubin 0.0 - 1.2 mg/dL 0.6  0.4  0.3   Alkaline Phos 38 - 126 U/L 81  111  105   AST 15 - 41 U/L 27  17  21    ALT 0 - 44 U/L 27  21  26     Lipid Panel     Component Value Date/Time   CHOL 206 (H) 05/03/2023 0905   TRIG 100 05/03/2023 0905   HDL 37 (L) 05/03/2023 0905   CHOLHDL 5.6 (H) 05/03/2023 0905   CHOLHDL 5.6 09/05/2021 0815   VLDL 15 09/05/2021 0815   LDLCALC 151 (H) 05/03/2023 0905   LABVLDL 18 05/03/2023 0905    Notable Signs/Symptoms: None  Lifestyle & Dietary Hx LIves by himself. Has a 26 yr old son who lives with his sister  Estimated daily fluid intake: 60 oz Supplements:  Sleep: Sleep apnea - go get re evaluated by Sleep center Stress / self-care:  Current average weekly physical activity: Walks some  24-Hr Dietary Recall Oatmeal 2 packets Talipia sweet potato and french fries, sweet tea and lemonade Chicken  fried rice, Green tea Estimated Energy Needs Calories: 1800 Carbohydrate: 200 g Protein: 135 g Fat: 50 g   NUTRITION DIAGNOSIS  NI-1.7 Predicted excessive energy intake As related to high calorie diet.  As evidenced by 420 lbs, BMI 58. Marland Kitchen   NUTRITION INTERVENTION  Nutrition education (E-1) on the following topics:  Nutrition and Diabetes education provided on My Plate, CHO counting, meal planning, portion sizes, timing of meals, avoiding snacks between meals unless having a low blood sugar, target ranges for A1C and blood sugars, signs/symptoms and treatment of hyper/hypoglycemia, monitoring blood sugars, taking medications as prescribed, benefits of exercising 30 minutes per day and prevention of complications of DM.  Lifestyle Medicine  - Whole Food, Plant Predominant Nutrition is highly recommended: Eat Plenty of vegetables, Mushrooms, fruits, Legumes, Whole Grains, Nuts, seeds in lieu of processed meats, processed snacks/pastries red meat, poultry, eggs.    -It is better to avoid simple carbohydrates including: Cakes, Sweet Desserts, Ice Cream, Soda (diet and regular), Sweet Tea, Candies, Chips, Cookies, Store Bought Juices, Alcohol in Excess of  1-2 drinks a day, Lemonade,  Artificial Sweeteners, Doughnuts, Coffee Creamers, "Sugar-free" Products, etc, etc.  This is not a complete list.....  Exercise: If you are able: 30 -60 minutes a day ,4 days a week, or 150 minutes a week.  The longer the better.  Combine stretch, strength, and aerobic activities.  If you were told in the past that you have high risk for cardiovascular diseases, you may seek evaluation by your heart doctor prior to initiating moderate to intense exercise programs.   Handouts Provided Include  Lifestyle Medicine handouts   Learning Style & Readiness for Change Teaching method utilized: Visual & Auditory  Demonstrated degree of understanding via: Teach Back  Barriers to learning/adherence to lifestyle change:  none  Goals Established by Pt Goals Quit soda 100% by birthday of July [birth year]. Look into joining PT and talk to trainer about a routine. Work on eating a piece of fruit and vegetable with lunch and dinner daily. Change to vinaigrette dressing Lose 1 lb per week. Goal weight by next visit is 414 lbs  MONITORING & EVALUATION Dietary intake, weekly physical activity, and weight in 1 month.  Next Steps  Patient is to work on weight loss.Marland Kitchen

## 2023-07-23 ENCOUNTER — Other Ambulatory Visit (HOSPITAL_COMMUNITY): Payer: Self-pay

## 2023-07-23 ENCOUNTER — Telehealth: Payer: Self-pay | Admitting: Pharmacy Technician

## 2023-07-23 ENCOUNTER — Ambulatory Visit: Payer: 59 | Admitting: Internal Medicine

## 2023-07-23 ENCOUNTER — Encounter: Payer: Self-pay | Admitting: Internal Medicine

## 2023-07-23 VITALS — BP 136/75 | HR 104 | Ht 71.0 in | Wt >= 6400 oz

## 2023-07-23 DIAGNOSIS — G4733 Obstructive sleep apnea (adult) (pediatric): Secondary | ICD-10-CM

## 2023-07-23 DIAGNOSIS — I10 Essential (primary) hypertension: Secondary | ICD-10-CM

## 2023-07-23 DIAGNOSIS — E785 Hyperlipidemia, unspecified: Secondary | ICD-10-CM

## 2023-07-23 DIAGNOSIS — E1169 Type 2 diabetes mellitus with other specified complication: Secondary | ICD-10-CM | POA: Diagnosis not present

## 2023-07-23 MED ORDER — TIRZEPATIDE 2.5 MG/0.5ML ~~LOC~~ SOAJ: 2.5000 mg | SUBCUTANEOUS | 0 refills | Status: DC

## 2023-07-23 MED ORDER — TIRZEPATIDE-WEIGHT MANAGEMENT 2.5 MG/0.5ML ~~LOC~~ SOLN: 2.5000 mg | SUBCUTANEOUS | 0 refills | Status: AC

## 2023-07-23 NOTE — Assessment & Plan Note (Signed)
 A1c 5.9 on labs from January.  As otherwise documented, will start Mounjaro 2.5 mg weekly in the setting of T2DM with morbid obesity and severe OSA.  Follow-up in 3 months for reassessment.

## 2023-07-23 NOTE — Assessment & Plan Note (Signed)
 Presenting today for HTN follow-up.  He was out of all antihypertensive medications at his last appointment.  Refills provided and close follow-up arranged.  BP is adequately controlled today.  No medication changes are indicated.

## 2023-07-23 NOTE — Progress Notes (Signed)
 Established Patient Office Visit  Subjective   Patient ID: Bobby Bridges, male    DOB: 06-09-1975  Age: 48 y.o. MRN: [id number]  Chief Complaint  Patient presents with   Hypertension    Six week follow up    Obesity    Six week follow up    Bobby Bridges returns to care today for follow-up.  He was last evaluated by me on 1/24.  His blood pressure was significantly elevated at that time and he reported that he was out of all antihypertensive medications.  Medications were refilled, repeat labs ordered, and 6-week follow-up arranged for reassessment.  In the interim, he presented to the emergency department on 3/31 in the setting of a cough with congestion and wheezing.  Treated for acute bronchitis.  There have otherwise been no acute interval events.  Bobby Bridges reports feeling well today.  He is asymptomatic and has no acute concerns to discuss.  Past Medical History:  Diagnosis Date   Anxiety    Asthma    Chronic back pain    Depression    GERD (gastroesophageal reflux disease)    Hearing loss in left ear    Hypertension    Seizures (HCC)    outgrew by age 31   Sleep apnea sleep apnea   History reviewed. No pertinent surgical history. Social History   Tobacco Use   Smoking status: Former    Current packs/day: 0.00    Average packs/day: 1 pack/day for 2.0 years (2.0 ttl pk-yrs)    Types: Cigarettes    Start date: 04/08/2010    Quit date: 04/08/2012    Years since quitting: 11.2   Smokeless tobacco: Never  Vaping Use   Vaping status: Never Used  Substance Use Topics   Alcohol use: No   Drug use: No   Family History  Problem Relation Age of Onset   Cancer Mother    Hypertension Mother    Cancer Father    Hypertension Father    Heart disease Other        "before 47 years old"   No Known Allergies  Review of Systems  Constitutional:  Negative for chills and fever.  HENT:  Negative for sore throat.   Respiratory:  Negative for cough and shortness  of breath.   Cardiovascular:  Negative for chest pain, palpitations and leg swelling.  Gastrointestinal:  Negative for abdominal pain, blood in stool, constipation, diarrhea, nausea and vomiting.  Genitourinary:  Negative for dysuria and hematuria.  Musculoskeletal:  Negative for myalgias.  Skin:  Negative for itching and rash.  Neurological:  Negative for dizziness and headaches.  Psychiatric/Behavioral:  Negative for depression and suicidal ideas.      Objective:     BP 136/75   Pulse (!) 104   Ht 5\' 11"  (1.803 m)   Wt (!) 417 lb 6.4 oz (189.3 kg)   SpO2 95%   BMI 58.22 kg/m  BP Readings from Last 3 Encounters:  07/23/23 136/75  07/08/23 130/85  05/03/23 (!) 163/96   Physical Exam Vitals reviewed.  Constitutional:      General: He is not in acute distress.    Appearance: Normal appearance. He is obese. He is not ill-appearing.  HENT:     Head: Normocephalic and atraumatic.     Nose: Nose normal. No congestion or rhinorrhea.     Mouth/Throat:     Mouth: Mucous membranes are moist.     Pharynx: Oropharynx is clear.  Eyes:  General: No scleral icterus.    Conjunctiva/sclera: Conjunctivae normal.     Pupils: Pupils are equal, round, and reactive to light.  Cardiovascular:     Rate and Rhythm: Normal rate and regular rhythm.     Pulses: Normal pulses.     Heart sounds: Normal heart sounds. No murmur heard. Pulmonary:     Effort: Pulmonary effort is normal.     Breath sounds: Normal breath sounds. No wheezing, rhonchi or rales.  Abdominal:     General: Abdomen is flat. Bowel sounds are normal. There is no distension.     Palpations: Abdomen is soft.     Tenderness: There is no abdominal tenderness.  Musculoskeletal:        General: No swelling or deformity. Normal range of motion.     Cervical back: Normal range of motion.  Skin:    General: Skin is warm and dry.     Capillary Refill: Capillary refill takes less than 2 seconds.  Neurological:     General: No  focal deficit present.     Mental Status: He is alert and oriented to person, place, and time.     Motor: No weakness.  Psychiatric:        Mood and Affect: Mood normal.        Behavior: Behavior normal.        Thought Content: Thought content normal.   Last CBC Lab Results  Component Value Date   WBC 6.8 07/08/2023   HGB 13.9 07/08/2023   HCT 44.7 07/08/2023   MCV 86.1 07/08/2023   MCH 26.8 07/08/2023   RDW 13.3 07/08/2023   PLT 269 07/08/2023   Last metabolic panel Lab Results  Component Value Date   GLUCOSE 96 07/08/2023   NA 140 07/08/2023   K 3.4 (L) 07/08/2023   CL 106 07/08/2023   CO2 26 07/08/2023   BUN 14 07/08/2023   CREATININE 0.98 07/08/2023   GFRNONAA >60 07/08/2023   CALCIUM 8.9 07/08/2023   PROT 7.0 07/08/2023   ALBUMIN 3.2 (L) 07/08/2023   LABGLOB 3.1 05/03/2023   AGRATIO 1.3 03/19/2022   BILITOT 0.6 07/08/2023   ALKPHOS 81 07/08/2023   AST 27 07/08/2023   ALT 27 07/08/2023   ANIONGAP 8 07/08/2023   Last lipids Lab Results  Component Value Date   CHOL 206 (H) 05/03/2023   HDL 37 (L) 05/03/2023   LDLCALC 151 (H) 05/03/2023   TRIG 100 05/03/2023   CHOLHDL 5.6 (H) 05/03/2023   Last hemoglobin A1c Lab Results  Component Value Date   HGBA1C 5.9 (H) 05/03/2023   Last thyroid functions Lab Results  Component Value Date   TSH 2.820 05/03/2023   Last vitamin D Lab Results  Component Value Date   VD25OH 11.9 (L) 05/03/2023   Last vitamin B12 and Folate Lab Results  Component Value Date   VITAMINB12 280 05/03/2023   FOLATE 8.9 05/03/2023   The 10-year ASCVD risk score (Arnett DK, et al., 2019) is: 16.2%    Assessment & Plan:   Problem List Items Addressed This Visit       Hypertension   Presenting today for HTN follow-up.  He was out of all antihypertensive medications at his last appointment.  Refills provided and close follow-up arranged.  BP is adequately controlled today.  No medication changes are indicated.      Severe  obstructive sleep apnea   Severe OSA on PSG from January 2024.  Referred to sleep medicine but did not establish  care.  Will assist with scheduling pulmonology evaluation today.      Hyperlipidemia associated with type 2 diabetes mellitus (HCC)   Poorly controlled on lipid panel from 1/24.  Atorvastatin 40 mg daily was resumed.  Repeat lipid panel at follow-up in 3 months.      Type 2 diabetes mellitus with other specified complication (HCC) - Primary   A1c 5.9 on labs from January.  As otherwise documented, will start Mounjaro 2.5 mg weekly in the setting of T2DM with morbid obesity and severe OSA.  Follow-up in 3 months for reassessment.      Morbid obesity (HCC)   Current weight 417 pounds.  BMI 58.2.  He has established care with medical nutrition therapy and is focused on dietary changes in an effort to lose weight.  He remains interested in medication options for weight loss.  Through shared decision making, Mounjaro has been started today in the setting of T2DM with morbid obesity and severe OSA.  Follow-up in 3 months for reassessment.      Return in about 3 months (around 10/22/2023).   Tobi Fortes, MD

## 2023-07-23 NOTE — Assessment & Plan Note (Signed)
 Poorly controlled on lipid panel from 1/24.  Atorvastatin 40 mg daily was resumed.  Repeat lipid panel at follow-up in 3 months.

## 2023-07-23 NOTE — Telephone Encounter (Signed)
 Pharmacy Patient Advocate Encounter   Received notification from CoverMyMeds that prior authorization for Zepbound 2.5MG /0.5ML pen-injectors is required/requested.   Insurance verification completed.   The patient is insured through [employer] / [employer] .   Per test claim: PA required; PA submitted to above mentioned insurance via CoverMyMeds Key/confirmation #/EOC [id number] Status is pending

## 2023-07-23 NOTE — Patient Instructions (Signed)
 It was a pleasure to see you today.  Thank you for giving us  the opportunity to be involved in your care.  Below is a brief recap of your visit and next steps.  We will plan to see you again in 3 months.  Summary Add Mounjaro 2.5 mg weekly No additional medication changes Follow up in 3 months

## 2023-07-23 NOTE — Assessment & Plan Note (Signed)
 Severe OSA on PSG from January 2024.  Referred to sleep medicine but did not establish care.  Will assist with scheduling pulmonology evaluation today.

## 2023-07-23 NOTE — Telephone Encounter (Signed)
 Pharmacy Patient Advocate Encounter   Received notification from CoverMyMeds that prior authorization for Mounjaro 2.5MG /0.5ML auto-injectors is required/requested.   Insurance verification completed.   The patient is insured through [employer] / [employer] consultant .   Mounjaro is indicated for Type 2 diabetes with evidence of an A1C lab value of 6.5 or higher. Please consider therapy change to Zepbound for treatment of obesity with severe OSA. We can try and get insurance coverage for Zepbound depending on the plans coverage criteria. Please advise.

## 2023-07-23 NOTE — Assessment & Plan Note (Signed)
 Current weight 417 pounds.  BMI 58.2.  He has established care with medical nutrition therapy and is focused on dietary changes in an effort to lose weight.  He remains interested in medication options for weight loss.  Through shared decision making, Mounjaro has been started today in the setting of T2DM with morbid obesity and severe OSA.  Follow-up in 3 months for reassessment.

## 2023-07-25 ENCOUNTER — Other Ambulatory Visit (HOSPITAL_COMMUNITY): Payer: Self-pay

## 2023-07-25 NOTE — Telephone Encounter (Signed)
 Pharmacy Patient Advocate Encounter  Received notification from [employer] / [employer] that Prior Authorization for Zepbound 2.5MG /0.5ML pen-injectors has been DENIED.  Full denial letter will be uploaded to the media tab. See denial reason below.   PA #/Case ID/Reference #: Key: [id number]

## 2023-07-26 ENCOUNTER — Telehealth: Payer: Self-pay

## 2023-07-26 NOTE — Telephone Encounter (Signed)
 Copied from CRM [id number]. Topic: Clinical - Medication Question >> Jul 25, 2023  1:21 PM Oddis Bench wrote: Reason for CRM: Patient is calling to make sure that he is waiting on his tirzepatide (ZEPBOUND) 2.5 MG/0.5ML injection vial to be approved.

## 2023-07-26 NOTE — Telephone Encounter (Signed)
 Patient advised his PA for zepbound was denied

## 2023-08-19 ENCOUNTER — Ambulatory Visit: Admitting: Nutrition

## 2023-08-19 ENCOUNTER — Telehealth: Payer: Self-pay | Admitting: Nutrition

## 2023-08-19 NOTE — Telephone Encounter (Signed)
 Vm reminder for appt today . pc

## 2023-09-16 ENCOUNTER — Ambulatory Visit: Admitting: Nutrition

## 2023-09-29 ENCOUNTER — Emergency Department (HOSPITAL_COMMUNITY)
Admission: EM | Admit: 2023-09-29 | Discharge: 2023-09-29 | Disposition: A | Discharge status: Home / Self Care | Attending: Emergency Medicine | Admitting: Emergency Medicine

## 2023-09-29 ENCOUNTER — Other Ambulatory Visit: Payer: Self-pay

## 2023-09-29 ENCOUNTER — Encounter (HOSPITAL_COMMUNITY): Payer: Self-pay | Admitting: *Deleted

## 2023-09-29 DIAGNOSIS — I10 Essential (primary) hypertension: Secondary | ICD-10-CM | POA: Diagnosis present

## 2023-09-29 DIAGNOSIS — Z79899 Other long term (current) drug therapy: Secondary | ICD-10-CM | POA: Diagnosis not present

## 2023-09-29 DIAGNOSIS — J45909 Unspecified asthma, uncomplicated: Secondary | ICD-10-CM | POA: Insufficient documentation

## 2023-09-29 DIAGNOSIS — E119 Type 2 diabetes mellitus without complications: Secondary | ICD-10-CM | POA: Insufficient documentation

## 2023-09-29 MED ORDER — AMLODIPINE BESYLATE 5 MG PO TABS
5.0000 mg | ORAL_TABLET | Freq: Once | ORAL | Status: AC
  Administered 2023-09-29: 5 mg via ORAL
  Filled 2023-09-29: qty 1

## 2023-09-29 MED ORDER — LOSARTAN POTASSIUM 25 MG PO TABS
50.0000 mg | ORAL_TABLET | Freq: Once | ORAL | Status: AC
  Administered 2023-09-29: 50 mg via ORAL
  Filled 2023-09-29: qty 2

## 2023-09-29 MED ORDER — HYDROCHLOROTHIAZIDE 25 MG PO TABS
25.0000 mg | ORAL_TABLET | Freq: Every day | ORAL | Status: DC
  Administered 2023-09-29: 25 mg via ORAL
  Filled 2023-09-29: qty 1

## 2023-09-29 NOTE — ED Provider Notes (Signed)
 [place] EMERGENCY DEPARTMENT AT [facility] Provider Note   CSN: [id number] Arrival date & time: 09/29/23  1720     Patient presents with: Hypertension   Bobby Bridges is a 48 y.o. male.    Hypertension Pertinent negatives include no headaches and no shortness of breath.       Bobby Bridges is a 48 y.o. male past medical history of hypertension, asthma, anxiety type 2 diabetes who presents to the Emergency Department for evaluation of elevated blood pressure.  He states he was in church earlier today and became sweaty.  He checked his blood pressure and was noted to be elevated he is unsure of the numbers.  States he is here because his pastor recommended that he be evaluated.  Patient does state that he forgot to take his antihypertensive medications today.  He denies any symptoms  Prior to Admission medications   Medication Sig Start Date End Date Taking? Authorizing Provider  albuterol  (VENTOLIN  HFA) 108 (90 Base) MCG/ACT inhaler Inhale 1-2 puffs into the lungs every 6 (six) hours as needed for wheezing or shortness of breath. 07/08/23   Daralene Lonni BIRCH, PA-C  amLODipine  (NORVASC ) 5 MG tablet Take 1 tablet (5 mg total) by mouth daily. 06/06/23   Melvenia Manus FORBES, MD  atorvastatin  (LIPITOR) 40 MG tablet Take 1 tablet (40 mg total) by mouth daily. 06/06/23   Melvenia Manus FORBES, MD  hydrochlorothiazide  (HYDRODIURIL ) 25 MG tablet Take 1 tablet (25 mg total) by mouth daily. 06/06/23   Melvenia Manus FORBES, MD  losartan  (COZAAR ) 50 MG tablet Take 1 tablet (50 mg total) by mouth daily. 06/06/23 09/04/23  Melvenia Manus FORBES, MD    Allergies: Patient has no known allergies.    Review of Systems  Constitutional:  Negative for chills and fever.  Eyes:  Negative for visual disturbance.  Respiratory:  Negative for cough, chest tightness and shortness of breath.   Gastrointestinal:  Negative for nausea and vomiting.  Musculoskeletal:  Negative for arthralgias and  myalgias.  Neurological:  Negative for dizziness, syncope, speech difficulty, weakness, numbness and headaches.  Psychiatric/Behavioral:  Negative for confusion.     Updated Vital Signs BP (!) 166/93   Pulse 98   Temp (!) 97 F (36.1 C) (Temporal)   Resp (!) 22   Ht 5' 11 (1.803 m)   Wt (!) 191.4 kg   SpO2 96%   BMI 58.86 kg/m   Physical Exam Vitals and nursing note reviewed.  Constitutional:      General: He is not in acute distress.    Appearance: Normal appearance. He is obese. He is not ill-appearing or toxic-appearing.  HENT:     Mouth/Throat:     Mouth: Mucous membranes are moist.   Eyes:     Extraocular Movements: Extraocular movements intact.     Conjunctiva/sclera: Conjunctivae normal.     Pupils: Pupils are equal, round, and reactive to light.    Cardiovascular:     Rate and Rhythm: Normal rate and regular rhythm.     Pulses: Normal pulses.  Pulmonary:     Effort: Pulmonary effort is normal.  Abdominal:     Palpations: Abdomen is soft.     Tenderness: There is no abdominal tenderness.   Musculoskeletal:        General: Normal range of motion.   Skin:    General: Skin is warm.     Capillary Refill: Capillary refill takes less than 2 seconds.   Neurological:  General: No focal deficit present.     Mental Status: He is alert.     GCS: GCS eye subscore is 4. GCS verbal subscore is 5. GCS motor subscore is 6.     Sensory: Sensation is intact. No sensory deficit.     Motor: Motor function is intact. No weakness.     Coordination: Coordination is intact.     (all labs ordered are listed, but only abnormal results are displayed) Labs Reviewed - No data to display  EKG: None  Radiology: No results found.   Procedures   Medications Ordered in the ED  amLODipine  (NORVASC ) tablet 5 mg (has no administration in time range)  hydrochlorothiazide  (HYDRODIURIL ) tablet 25 mg (has no administration in time range)  losartan  (COZAAR ) tablet 50 mg (has  no administration in time range)                                    Medical Decision Making Patient here for evaluation of elevated blood pressure.  He is asymptomatic.  States his blood pressure was elevated today while at church and was recommended by his pastor to come to the ER for evaluation.  Patient states he did not take his antihypertensive medications today.  No focal neurodeficits on exam.  He is asymptomatic, no indication for imaging or labs at this time.  On review of medical records, he had normal kidney functions and March of this year, he is followed by local primary care. He is not out of his medications  Plan to give his routine blood pressure medications and observe, I anticipate discharge home.  Amount and/or Complexity of Data Reviewed Discussion of management or test interpretation with external provider(s): Patient was given his routine blood pressure medications here and observed.  He has tolerated fluids well.  Continues to remain asymptomatic.  Blood pressure improved nicely now 130/80 on my recheck.  Appropriate for discharge home, will follow-up outpatient with PCP.  Risk Prescription drug management.        Final diagnoses:  Hypertension, unspecified type    ED Discharge Orders     None          Herlinda Madelin RIGGERS 09/29/23 1912    Dean Clarity, MD 09/29/23 [phone]

## 2023-09-29 NOTE — Discharge Instructions (Signed)
 Please take your blood pressure medications every day as directed.  Follow-up with your primary care doctor soon.  Return to emergency department if needed

## 2023-09-29 NOTE — ED Triage Notes (Signed)
 Pt concerned for hypertension, pt states he does not take his BP at home, denies any HA. Denies any CP or SOB

## 2023-10-03 ENCOUNTER — Telehealth: Payer: Self-pay | Admitting: General Practice

## 2023-10-03 NOTE — Telephone Encounter (Signed)
 Left detailed message for patient making him aware that our office is doing Diabetic Eye Exams on the day of his appt with the doctor (7/15) and can schedule him to have a diabetic eye exam done on the same day he sees the doctor if he would like to have that done. Advised pt to call our office and let us  know.

## 2023-10-15 ENCOUNTER — Ambulatory Visit (INDEPENDENT_AMBULATORY_CARE_PROVIDER_SITE_OTHER): Admitting: Adult Health

## 2023-10-15 ENCOUNTER — Encounter: Payer: Self-pay | Admitting: Adult Health

## 2023-10-15 VITALS — BP 97/61 | HR 97 | Ht 71.0 in | Wt >= 6400 oz

## 2023-10-15 DIAGNOSIS — Z6841 Body Mass Index (BMI) 40.0 and over, adult: Secondary | ICD-10-CM

## 2023-10-15 DIAGNOSIS — G4733 Obstructive sleep apnea (adult) (pediatric): Secondary | ICD-10-CM | POA: Diagnosis not present

## 2023-10-15 DIAGNOSIS — Z87891 Personal history of nicotine dependence: Secondary | ICD-10-CM | POA: Diagnosis not present

## 2023-10-15 NOTE — Assessment & Plan Note (Signed)
 Morbid obesity.  Patient is encouraged on healthy weight loss.  BMI is 59.  On return visit can discuss weight loss options including Zepbound  that is approved for sleep apnea

## 2023-10-15 NOTE — Patient Instructions (Signed)
 Set up home sleep study  Work on healthy weight loss.  Do not drive if sleepy  Follow up in 6 weeks to discuss results and treatment plan.

## 2023-10-15 NOTE — Assessment & Plan Note (Signed)
 History of severe obstructive sleep apnea diagnosed in 2024.  Patient continues to have ongoing symptoms suspicious for sleep apnea with snoring, daytime sleepiness, BMI 59, comorbidities with hypertension and prediabetes.  Suspect patient has ongoing sleep apnea.  Will need to set up home sleep study since previous HST study is greater than 47-year-old.  Pending these results we will decide on neck step with treatment plan  - discussed how weight can impact sleep and risk for sleep disordered breathing - discussed options to assist with weight loss: combination of diet modification, cardiovascular and strength training exercises   - had an extensive discussion regarding the adverse health consequences related to untreated sleep disordered breathing - specifically discussed the risks for hypertension, coronary artery disease, cardiac dysrhythmias, cerebrovascular disease, and diabetes - lifestyle modification discussed   - discussed how sleep disruption can increase risk of accidents, particularly when driving - safe driving practices were discussed   Plan  Patient Instructions  Set up home sleep study  Work on healthy weight loss.  Do not drive if sleepy  Follow up in 6 weeks to discuss results and treatment plan.

## 2023-10-15 NOTE — Progress Notes (Signed)
 @Patient  ID: Bobby Bridges, male    DOB: [birth year], 48 y.o.   MRN: [id number]  Chief Complaint  Patient presents with   Establish Care    Referring provider: Melvenia Manus FORBES, MD  HPI: 48 yo male seen for sleep consult 10/15/23 to establish for sleep apnea   TEST/EVENTS :   10/15/2023 Sleep consult  Patient presents for sleep consult today.  Kindly referred by primary care.  Patient was diagnosed with sleep apnea in January 2024.  Sleep study showed severe sleep apnea with AHI at 30/hour and SpO2 low at 70%.  Patient says he was told to start on CPAP but never received.  Patient says he goes to bed about 10 PM.  Gets up at 5:30 AM.  Is up a few times a night.  Does wake up feeling tired at times.  Patient does not nap.  Has minimal caffeine intake.  No history of congestive heart failure stroke.  No symptoms suspicious for cataplexy or sleep paralysis.  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  Occasionally works third shift if he has to stay over.  Typically is a 1st and 2nd shift work type.  Does not use any sleep aids.  Epworth score is 4 out of 24.  Typically gets sleepy if he sits down to watch TV or rest.  Does have loud snoring.  Occasionally wakes up struggling to breathe or choking at times.  Has a medical history of hypertension.  Some prediabetes.  Current weight is 425 pounds with a BMI at 32  Social history patient is single.  Has 1 child.  Works at a group home.  He lives alone.  History of smoking half a pack a day for 20 years.  Quit in 2020.  Social alcohol.  No drug use.  Family history believes his brother has sleep apnea.  Otherwise negative.   Surgical history none.     No Known Allergies  Immunization History  Administered Date(s) Administered   Influenza Whole 04/22/2006, 02/03/2008   Influenza,inj,Quad PF,6+ Mos 12/14/2021   Tdap 01/31/2020    Past Medical History:  Diagnosis Date   Anxiety    Asthma    Chronic back pain    Depression     GERD (gastroesophageal reflux disease)    Hearing loss in left ear    Hypertension    Seizures (HCC)    outgrew by age 28   Sleep apnea sleep apnea    Tobacco History: Social History   Tobacco Use  Smoking Status Former   Current packs/day: 0.00   Average packs/day: 1 pack/day for 2.0 years (2.0 ttl pk-yrs)   Types: Cigarettes   Start date: 04/08/2010   Quit date: 04/08/2012   Years since quitting: 11.5  Smokeless Tobacco Never   Counseling given: Not Answered   Outpatient Medications Prior to Visit  Medication Sig Dispense Refill   albuterol  (VENTOLIN  HFA) 108 (90 Base) MCG/ACT inhaler Inhale 1-2 puffs into the lungs every 6 (six) hours as needed for wheezing or shortness of breath. 1 each 0   amLODipine  (NORVASC ) 5 MG tablet Take 1 tablet (5 mg total) by mouth daily. 90 tablet 3   atorvastatin  (LIPITOR) 40 MG tablet Take 1 tablet (40 mg total) by mouth daily. 90 tablet 3   hydrochlorothiazide  (HYDRODIURIL ) 25 MG tablet Take 1 tablet (25 mg total) by mouth daily. 90 tablet 3   losartan  (COZAAR ) 50 MG tablet Take 1 tablet (50 mg total) by mouth  daily. 90 tablet 3   No facility-administered medications prior to visit.     Review of Systems:   Constitutional:   No  weight loss, night sweats,  Fevers, chills, fatigue, or  lassitude.  HEENT:   No headaches,  Difficulty swallowing,  Tooth/dental problems, or  Sore throat,                No sneezing, itching, ear ache, nasal congestion, post nasal drip,   CV:  No chest pain,  Orthopnea, PND, swelling in lower extremities, anasarca, dizziness, palpitations, syncope.   GI  No heartburn, indigestion, abdominal pain, nausea, vomiting, diarrhea, change in bowel habits, loss of appetite, bloody stools.   Resp: No shortness of breath with exertion or at rest.  No excess mucus, no productive cough,  No non-productive cough,  No coughing up of blood.  No change in color of mucus.  No wheezing.  No chest wall deformity  Skin: no  rash or lesions.  GU: no dysuria, change in color of urine, no urgency or frequency.  No flank pain, no hematuria   MS:  No joint pain or swelling.  No decreased range of motion.  No back pain.    Physical Exam  BP 97/61   Pulse 97   Ht 5' 11 (1.803 m)   Wt (!) 425 lb (192.8 kg)   SpO2 98% Comment: RA  BMI 59.28 kg/m   GEN: A/Ox3; pleasant , NAD, well nourished    HEENT:  [place]/AT,  NOSE-clear, THROAT-clear, no lesions, no postnasal drip or exudate noted. Class 4 MP airway   NECK:  Supple w/ fair ROM; no JVD; normal carotid impulses w/o bruits; no thyromegaly or nodules palpated; no lymphadenopathy.    RESP  Clear  P & A; w/o, wheezes/ rales/ or rhonchi. no accessory muscle use, no dullness to percussion  CARD:  RRR, no m/r/g, no peripheral edema, pulses intact, no cyanosis or clubbing.  GI:   Soft & nt; nml bowel sounds; no organomegaly or masses detected.   Musco: Warm bil, no deformities or joint swelling noted.   Neuro: alert, no focal deficits noted.    Skin: Warm, no lesions or rashes    Lab Results:  CBC  BMET  Imaging: No results found.  Administration History     None           No data to display          No results found for: NITRICOXIDE      Assessment & Plan:   Severe obstructive sleep apnea History of severe obstructive sleep apnea diagnosed in 2024.  Patient continues to have ongoing symptoms suspicious for sleep apnea with snoring, daytime sleepiness, BMI 59, comorbidities with hypertension and prediabetes.  Suspect patient has ongoing sleep apnea.  Will need to set up home sleep study since previous HST study is greater than 72-year-old.  Pending these results we will decide on neck step with treatment plan  - discussed how weight can impact sleep and risk for sleep disordered breathing - discussed options to assist with weight loss: combination of diet modification, cardiovascular and strength training exercises   - had an  extensive discussion regarding the adverse health consequences related to untreated sleep disordered breathing - specifically discussed the risks for hypertension, coronary artery disease, cardiac dysrhythmias, cerebrovascular disease, and diabetes - lifestyle modification discussed   - discussed how sleep disruption can increase risk of accidents, particularly when driving - safe driving practices were discussed  Plan  Patient Instructions  Set up home sleep study  Work on healthy weight loss.  Do not drive if sleepy  Follow up in 6 weeks to discuss results and treatment plan.     Morbid obesity (HCC) Morbid obesity.  Patient is encouraged on healthy weight loss.  BMI is 59.  On return visit can discuss weight loss options including Zepbound  that is approved for sleep apnea     Madelin Stank, NP 10/15/2023

## 2023-10-22 ENCOUNTER — Ambulatory Visit

## 2023-11-06 ENCOUNTER — Telehealth: Payer: Self-pay | Admitting: Adult Health

## 2023-11-06 NOTE — Telephone Encounter (Signed)
 LVM for patient to call and discuss rescheduling the 87/25 8:30 am appointment  with [facility], NP--(provider not in office until 10)

## 2023-11-06 NOTE — Telephone Encounter (Signed)
 Patient scheduled with Dr. Catherine Friday 12/13/23 at 8:30 am----will mail new appointment information to patient and he voiced his understanding

## 2023-11-08 ENCOUNTER — Encounter

## 2023-11-11 ENCOUNTER — Ambulatory Visit

## 2023-11-11 DIAGNOSIS — G4733 Obstructive sleep apnea (adult) (pediatric): Secondary | ICD-10-CM

## 2023-11-14 ENCOUNTER — Ambulatory Visit: Admitting: Adult Health

## 2023-11-28 DIAGNOSIS — G4733 Obstructive sleep apnea (adult) (pediatric): Secondary | ICD-10-CM | POA: Diagnosis not present

## 2023-12-02 ENCOUNTER — Ambulatory Visit

## 2023-12-09 ENCOUNTER — Emergency Department (HOSPITAL_COMMUNITY)
Admission: EM | Admit: 2023-12-09 | Discharge: 2023-12-09 | Disposition: A | Discharge status: Home / Self Care | Attending: Emergency Medicine | Admitting: Emergency Medicine

## 2023-12-09 ENCOUNTER — Other Ambulatory Visit: Payer: Self-pay

## 2023-12-09 ENCOUNTER — Encounter (HOSPITAL_COMMUNITY): Payer: Self-pay

## 2023-12-09 ENCOUNTER — Emergency Department (HOSPITAL_COMMUNITY)

## 2023-12-09 DIAGNOSIS — J4521 Mild intermittent asthma with (acute) exacerbation: Secondary | ICD-10-CM | POA: Diagnosis not present

## 2023-12-09 DIAGNOSIS — R0602 Shortness of breath: Secondary | ICD-10-CM | POA: Diagnosis present

## 2023-12-09 DIAGNOSIS — Z87891 Personal history of nicotine dependence: Secondary | ICD-10-CM | POA: Diagnosis not present

## 2023-12-09 DIAGNOSIS — E785 Hyperlipidemia, unspecified: Secondary | ICD-10-CM | POA: Insufficient documentation

## 2023-12-09 DIAGNOSIS — Z79899 Other long term (current) drug therapy: Secondary | ICD-10-CM | POA: Insufficient documentation

## 2023-12-09 DIAGNOSIS — I10 Essential (primary) hypertension: Secondary | ICD-10-CM | POA: Insufficient documentation

## 2023-12-09 DIAGNOSIS — E1169 Type 2 diabetes mellitus with other specified complication: Secondary | ICD-10-CM | POA: Insufficient documentation

## 2023-12-09 DIAGNOSIS — Z7951 Long term (current) use of inhaled steroids: Secondary | ICD-10-CM | POA: Diagnosis not present

## 2023-12-09 DIAGNOSIS — Z7952 Long term (current) use of systemic steroids: Secondary | ICD-10-CM | POA: Diagnosis not present

## 2023-12-09 LAB — CBC WITH DIFFERENTIAL/PLATELET
Abs Immature Granulocytes: 0.01 K/uL (ref 0.00–0.07)
Basophils Absolute: 0 K/uL (ref 0.0–0.1)
Basophils Relative: 1 %
Eosinophils Absolute: 0.1 K/uL (ref 0.0–0.5)
Eosinophils Relative: 2 %
HCT: 45.5 % (ref 39.0–52.0)
Hemoglobin: 14.4 g/dL (ref 13.0–17.0)
Immature Granulocytes: 0 %
Lymphocytes Relative: 41 %
Lymphs Abs: 2 K/uL (ref 0.7–4.0)
MCH: 26.9 pg (ref 26.0–34.0)
MCHC: 31.6 g/dL (ref 30.0–36.0)
MCV: 84.9 fL (ref 80.0–100.0)
Monocytes Absolute: 0.3 K/uL (ref 0.1–1.0)
Monocytes Relative: 7 %
Neutro Abs: 2.4 K/uL (ref 1.7–7.7)
Neutrophils Relative %: 49 %
Platelets: 252 K/uL (ref 150–400)
RBC: 5.36 MIL/uL (ref 4.22–5.81)
RDW: 13.5 % (ref 11.5–15.5)
WBC: 4.8 K/uL (ref 4.0–10.5)
nRBC: 0 % (ref 0.0–0.2)

## 2023-12-09 LAB — BASIC METABOLIC PANEL WITH GFR
Anion gap: 8 (ref 5–15)
BUN: 13 mg/dL (ref 6–20)
CO2: 23 mmol/L (ref 22–32)
Calcium: 8.5 mg/dL — ABNORMAL LOW (ref 8.9–10.3)
Chloride: 108 mmol/L (ref 98–111)
Creatinine, Ser: 0.95 mg/dL (ref 0.61–1.24)
GFR, Estimated: >60 mL/min (ref >60)
Glucose, Bld: 139 mg/dL — ABNORMAL HIGH (ref 70–99)
Potassium: 3.3 mmol/L — ABNORMAL LOW (ref 3.5–5.1)
Sodium: 139 mmol/L (ref 135–145)

## 2023-12-09 LAB — BRAIN NATRIURETIC PEPTIDE: B Natriuretic Peptide: 40 pg/mL (ref 0.0–100.0)

## 2023-12-09 MED ORDER — PREDNISONE 50 MG PO TABS
60.0000 mg | ORAL_TABLET | Freq: Once | ORAL | Status: AC
  Administered 2023-12-09: 60 mg via ORAL
  Filled 2023-12-09: qty 1

## 2023-12-09 MED ORDER — ALBUTEROL SULFATE HFA 108 (90 BASE) MCG/ACT IN AERS
1.0000 | INHALATION_SPRAY | Freq: Once | RESPIRATORY_TRACT | Status: AC
  Administered 2023-12-09: 1 via RESPIRATORY_TRACT
  Filled 2023-12-09: qty 6.7

## 2023-12-09 MED ORDER — ALBUTEROL SULFATE HFA 108 (90 BASE) MCG/ACT IN AERS: 1.0000 | INHALATION_SPRAY | Freq: Four times a day (QID) | RESPIRATORY_TRACT | 0 refills | Status: DC | PRN

## 2023-12-09 MED ORDER — IPRATROPIUM-ALBUTEROL 0.5-2.5 (3) MG/3ML IN SOLN
3.0000 mL | Freq: Once | RESPIRATORY_TRACT | Status: AC
  Administered 2023-12-09: 3 mL via RESPIRATORY_TRACT
  Filled 2023-12-09: qty 3

## 2023-12-09 MED ORDER — POTASSIUM CHLORIDE CRYS ER 20 MEQ PO TBCR
40.0000 meq | EXTENDED_RELEASE_TABLET | Freq: Once | ORAL | Status: AC
  Administered 2023-12-09: 40 meq via ORAL
  Filled 2023-12-09: qty 2

## 2023-12-09 MED ORDER — PREDNISONE 50 MG PO TABS: 50.0000 mg | ORAL_TABLET | Freq: Every day | ORAL | 0 refills | Status: AC

## 2023-12-09 NOTE — ED Notes (Signed)
 Pt changed into gown and hooked up to the monitor.

## 2023-12-09 NOTE — ED Provider Notes (Signed)
 [place] EMERGENCY DEPARTMENT AT [facility] Provider Note  CSN: [id number] Arrival date & time: 12/09/23 0848  Chief Complaint(s) Shortness of Breath (Pt states wheezing as well)  HPI Bobby Bridges is a 48 y.o. male history of obesity, asthma presenting to the emergency department with shortness of breath.  Reports that he has noticed wheezing for the past couple of days.  Reports mild shortness of breath and cough.  No productive cough.  No fevers or chills.  No sore throat or runny nose currently.  No abdominal pain or chest pain.  Reports some mild leg swelling which is chronic.  Symptoms worse with exertion.  Feels like prior asthma.  Does not have medication at home for this.   Past Medical History Past Medical History:  Diagnosis Date   Anxiety    Asthma    Chronic back pain    Depression    GERD (gastroesophageal reflux disease)    Hearing loss in left ear    Hypertension    Seizures (HCC)    outgrew by age 37   Sleep apnea sleep apnea   Patient Active Problem List   Diagnosis Date Noted   Vitamin D  deficiency 08/03/2022   Screening for colorectal cancer 01/24/2022   Annual physical exam 12/14/2021   Type 2 diabetes mellitus with other specified complication (HCC) 09/01/2021   Mild intermittent asthma without complication 05/30/2021   Hyperlipidemia associated with type 2 diabetes mellitus (HCC) 05/30/2021   Erectile dysfunction 05/30/2021   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   Ankle edema, bilateral 05/30/2021   Elevated troponin 09/22/2014   Nausea 09/22/2014   Major depression 02/04/2012   Major depressive disorder, recurrent episode (HCC) 12/02/2011    Class: Acute   REDNESS OR DISCHARGE OF EYE 02/03/2008   ALOPECIA 02/03/2008   HYPERLIPIDEMIA 06/27/2006   ALLERGIC RHINITIS, SEASONAL 06/27/2006   Severe obstructive sleep apnea 06/27/2006   HYPERGLYCEMIA 06/27/2006   Morbid obesity (HCC) 04/23/2006   DEPRESSION 04/23/2006   Hypertension  04/23/2006   ALLERGIC RHINITIS 04/23/2006   BRONCHITIS NOS 04/23/2006   Asthma 04/23/2006   GERD 04/23/2006   SEIZURE DISORDER 04/23/2006   Home Medication(s) Prior to Admission medications   Medication Sig Start Date End Date Taking? Authorizing Provider  albuterol  (VENTOLIN  HFA) 108 (90 Base) MCG/ACT inhaler Inhale 1-2 puffs into the lungs every 6 (six) hours as needed for wheezing or shortness of breath. 12/09/23  Yes Francesca Elsie CROME, MD  predniSONE  (DELTASONE ) 50 MG tablet Take 1 tablet (50 mg total) by mouth daily with breakfast for 4 days. 12/10/23 12/14/23 Yes Francesca Elsie CROME, MD  amLODipine  (NORVASC ) 5 MG tablet Take 1 tablet (5 mg total) by mouth daily. 06/06/23   Melvenia Manus FORBES, MD  atorvastatin  (LIPITOR) 40 MG tablet Take 1 tablet (40 mg total) by mouth daily. 06/06/23   Melvenia Manus FORBES, MD  hydrochlorothiazide  (HYDRODIURIL ) 25 MG tablet Take 1 tablet (25 mg total) by mouth daily. 06/06/23   Melvenia Manus FORBES, MD  losartan  (COZAAR ) 50 MG tablet Take 1 tablet (50 mg total) by mouth daily. 06/06/23 10/15/23  Melvenia Manus FORBES, MD  Past Surgical History History reviewed. No pertinent surgical history. Family History Family History  Problem Relation Age of Onset   Cancer Mother    Hypertension Mother    Cancer Father    Hypertension Father    Heart disease Other        before 46 years old    Social History Social History   Tobacco Use   Smoking status: Former    Current packs/day: 0.00    Average packs/day: 1 pack/day for 2.0 years (2.0 ttl pk-yrs)    Types: Cigarettes    Start date: 04/08/2010    Quit date: 04/08/2012    Years since quitting: 11.6   Smokeless tobacco: Never  Vaping Use   Vaping status: Never Used  Substance Use Topics   Alcohol use: No   Drug use: No   Allergies Patient has no known allergies.  Review of  Systems Review of Systems  All other systems reviewed and are negative.   Physical Exam Vital Signs  I have reviewed the triage vital signs BP (!) 120/96   Pulse 94   Temp 98.1 F (36.7 C) (Oral)   Resp 16   Ht 5' 11 (1.803 m)   Wt (!) 192.8 kg   SpO2 99%   BMI 59.28 kg/m  Physical Exam Vitals and nursing note reviewed.  Constitutional:      General: He is not in acute distress.    Appearance: Normal appearance. He is obese.  HENT:     Mouth/Throat:     Mouth: Mucous membranes are moist.  Eyes:     Conjunctiva/sclera: Conjunctivae normal.  Cardiovascular:     Rate and Rhythm: Normal rate and regular rhythm.  Pulmonary:     Effort: Pulmonary effort is normal. No respiratory distress.     Breath sounds: Examination of the right-upper field reveals wheezing. Examination of the left-upper field reveals wheezing. Examination of the right-middle field reveals wheezing. Examination of the left-middle field reveals wheezing. Examination of the right-lower field reveals wheezing. Examination of the left-lower field reveals wheezing. Wheezing present.  Abdominal:     General: Abdomen is flat.     Palpations: Abdomen is soft.     Tenderness: There is no abdominal tenderness.  Musculoskeletal:     Right lower leg: Edema present.     Left lower leg: Edema present.     Comments: Trace lower extremity bilateral edema  Skin:    General: Skin is warm and dry.     Capillary Refill: Capillary refill takes less than 2 seconds.  Neurological:     Mental Status: He is alert and oriented to person, place, and time. Mental status is at baseline.  Psychiatric:        Mood and Affect: Mood normal.        Behavior: Behavior normal.     ED Results and Treatments Labs (all labs ordered are listed, but only abnormal results are displayed) Labs Reviewed  BASIC METABOLIC PANEL WITH GFR - Abnormal; Notable for the following components:      Result Value   Potassium 3.3 (*)    Glucose, Bld  139 (*)    Calcium  8.5 (*)    All other components within normal limits  CBC WITH DIFFERENTIAL/PLATELET  BRAIN NATRIURETIC PEPTIDE  Radiology DG Chest Portable 1 View Result Date: 12/09/2023 CLINICAL DATA:  48 year old male with shortness of breath, wheezing. EXAM: PORTABLE CHEST 1 VIEW COMPARISON:  Portable chest 07/08/2023 and earlier. FINDINGS: Portable AP view at 0931 hours. Mild chronic cardiomegaly. Stable cardiac size and mediastinal contours. Visualized tracheal air column is within normal limits. Normal lung volumes. Allowing for portable technique the lungs are clear. No pneumothorax or pleural effusion. Paucity of bowel gas in the visible abdomen. S-shaped thoracolumbar scoliosis. IMPRESSION: No acute cardiopulmonary abnormality. Chronic mild cardiomegaly. Thoracolumbar scoliosis. Electronically Signed   By: VEAR Hurst M.D.   On: 12/09/2023 09:51    Pertinent labs & imaging results that were available during my care of the patient were reviewed by me and considered in my medical decision making (see MDM for details).  Medications Ordered in ED Medications  ipratropium-albuterol  (DUONEB) 0.5-2.5 (3) MG/3ML nebulizer solution 3 mL (3 mLs Nebulization Given 12/09/23 0938)  predniSONE  (DELTASONE ) tablet 60 mg (60 mg Oral Given 12/09/23 0937)  potassium chloride  SA (KLOR-CON  M) CR tablet 40 mEq (40 mEq Oral Given 12/09/23 1050)  albuterol  (VENTOLIN  HFA) 108 (90 Base) MCG/ACT inhaler 1 puff (1 puff Inhalation Given 12/09/23 1108)                                                                                                                                     Procedures Procedures  (including critical care time)  Medical Decision Making / ED Course   MDM:  48 year old presenting to the emergency department shortness of breath.  Patient overall well-appearing, physical  examination with wheezing.  Suspect this is due to patient's underlying asthma.  Low concern for other dangerous process such as CHF, pneumonia, pneumothorax we will check some basic labs.  He he does not have any respiratory distress.  If labs are reassuring and chest x-ray is clear, patient feeling better after DuoNeb and steroid, likely discharge home.  Clinical Course as of 12/09/23 1108  Mon Dec 09, 2023  1106 Patient is feeling better.  Labs are reassuring.  X-ray is clear. Will discharge patient to home. All questions answered. Patient comfortable with plan of discharge. Return precautions discussed with patient and specified on the after visit summary.  [WS]    Clinical Course User Index [WS] Francesca Elsie CROME, MD     Additional history obtained:  -External records from outside source obtained and reviewed including: Chart review including previous notes, labs, imaging, consultation notes including prior notes    Lab Tests: -I ordered, reviewed, and interpreted labs.   The pertinent results include:   Labs Reviewed  BASIC METABOLIC PANEL WITH GFR - Abnormal; Notable for the following components:      Result Value   Potassium 3.3 (*)    Glucose, Bld 139 (*)    Calcium  8.5 (*)    All other components within normal limits  CBC WITH DIFFERENTIAL/PLATELET  BRAIN NATRIURETIC PEPTIDE  Notable for mild hypokalemia, normal BNP   EKG   EKG Interpretation Date/Time:  Monday December 09 2023 09:02:35 EDT Ventricular Rate:  88 PR Interval:  161 QRS Duration:  103 QT Interval:  371 QTC Calculation: 449 R Axis:   105  Text Interpretation: Sinus rhythm Probable left atrial enlargement Right axis deviation Confirmed by Francesca Fallow ([id number]) on 12/09/2023 10:37:05 AM         Imaging Studies ordered: I ordered imaging studies including CXR On my interpretation imaging demonstrates no acute process I independently visualized and interpreted imaging. I agree with the  radiologist interpretation   Medicines ordered and prescription drug management: Meds ordered this encounter  Medications   ipratropium-albuterol  (DUONEB) 0.5-2.5 (3) MG/3ML nebulizer solution 3 mL   predniSONE  (DELTASONE ) tablet 60 mg   potassium chloride  SA (KLOR-CON  M) CR tablet 40 mEq   albuterol  (VENTOLIN  HFA) 108 (90 Base) MCG/ACT inhaler 1 puff   predniSONE  (DELTASONE ) 50 MG tablet    Sig: Take 1 tablet (50 mg total) by mouth daily with breakfast for 4 days.    Dispense:  4 tablet    Refill:  0   albuterol  (VENTOLIN  HFA) 108 (90 Base) MCG/ACT inhaler    Sig: Inhale 1-2 puffs into the lungs every 6 (six) hours as needed for wheezing or shortness of breath.    Dispense:  18 g    Refill:  0    -I have reviewed the patients home medicines and have made adjustments as needed  Social Determinants of Health:  Diagnosis or treatment significantly limited by social determinants of health: obesity   Reevaluation: After the interventions noted above, I reevaluated the patient and found that their symptoms have resolved  Co morbidities that complicate the patient evaluation  Past Medical History:  Diagnosis Date   Anxiety    Asthma    Chronic back pain    Depression    GERD (gastroesophageal reflux disease)    Hearing loss in left ear    Hypertension    Seizures (HCC)    outgrew by age 3   Sleep apnea sleep apnea      Dispostion: Disposition decision including need for hospitalization was considered, and patient discharged from emergency department.    Final Clinical Impression(s) / ED Diagnoses Final diagnoses:  Mild intermittent asthma with exacerbation     This chart was dictated using voice recognition software.  Despite best efforts to proofread,  errors can occur which can change the documentation meaning.    Francesca Fallow CROME, MD 12/09/23 [phone]

## 2023-12-09 NOTE — ED Triage Notes (Signed)
 Pt states he has been wheezing and SOB last few days. States he thinks he had asthma as a kid but not sure. Does not use any type of inhaler. Hx of HTN, in the middle of changing drs and not sure about his BP meds in the future.

## 2023-12-09 NOTE — Discharge Instructions (Addendum)
 We evaluated you for your shortness of breath.  Your testing was reassuring and your symptoms improved with treatment in the emergency department.  We have refilled your asthma inhaler and have sent your course of steroids.  Please start these steroids tomorrow since you received a dose of steroids in the ER today.  Please follow-up with your primary doctor's office.  Since Dr. Melvenia has left you can schedule follow-up with a new primary doctor.  Return if your symptoms worsen.

## 2023-12-12 ENCOUNTER — Ambulatory Visit: Payer: Self-pay | Admitting: Adult Health

## 2023-12-12 NOTE — Progress Notes (Unsigned)
   Established Patient Pulmonology Office Visit   Subjective:  Patient ID: Bobby Bridges, male    DOB: 1975/09/25  MRN: [id number]  CC: No chief complaint on file.   HPI  Mr. Aguinaldo is a 48 y/o M with a PMH significant for severe OSA (not currently on therapy) and asthma who presents for follow up.  Last seen by Tammy NP 10/15/23, recommended repeat HST. This was performed 11/2023. Total AHI is 9.4 with desaturation to a nadir of 74%. Supine AHI is 25. Total time < 89% is 15 minutes.  Recently went to ED for SOB on 12/09/23, diagnosed with asthma exacerbation, given steroids and discharged from ED.    {PULM QUESTIONNAIRES (Optional):33196}  ROS  {History (Optional):23778}  Current Outpatient Medications:    albuterol  (VENTOLIN  HFA) 108 (90 Base) MCG/ACT inhaler, Inhale 1-2 puffs into the lungs every 6 (six) hours as needed for wheezing or shortness of breath., Disp: 18 g, Rfl: 0   amLODipine  (NORVASC ) 5 MG tablet, Take 1 tablet (5 mg total) by mouth daily., Disp: 90 tablet, Rfl: 3   atorvastatin  (LIPITOR) 40 MG tablet, Take 1 tablet (40 mg total) by mouth daily., Disp: 90 tablet, Rfl: 3   hydrochlorothiazide  (HYDRODIURIL ) 25 MG tablet, Take 1 tablet (25 mg total) by mouth daily., Disp: 90 tablet, Rfl: 3   losartan  (COZAAR ) 50 MG tablet, Take 1 tablet (50 mg total) by mouth daily., Disp: 90 tablet, Rfl: 3   predniSONE  (DELTASONE ) 50 MG tablet, Take 1 tablet (50 mg total) by mouth daily with breakfast for 4 days., Disp: 4 tablet, Rfl: 0      Objective:  There were no vitals taken for this visit. {Pulm Vitals (Optional):32837}  Physical Exam   Diagnostic Review:  {Labs (Optional):32838}  CTA Chest 2021: no large territory PE, cardiomegaly, no masses, nodules, or infiltrates. Theres' bronchial wall thickening  CXR 12/09/2023: cardiomegaly, no infiltrates    Assessment & Plan:   Assessment & Plan OSA (obstructive sleep apnea)   No orders of the defined types were  placed in this encounter.     No follow-ups on file.   Bryanda Mikel, MD

## 2023-12-13 ENCOUNTER — Ambulatory Visit (INDEPENDENT_AMBULATORY_CARE_PROVIDER_SITE_OTHER): Admitting: Pulmonary Disease

## 2023-12-13 ENCOUNTER — Encounter: Payer: Self-pay | Admitting: Pulmonary Disease

## 2023-12-13 VITALS — BP 127/90 | HR 79 | Ht 71.0 in | Wt >= 6400 oz

## 2023-12-13 DIAGNOSIS — Z23 Encounter for immunization: Secondary | ICD-10-CM

## 2023-12-13 DIAGNOSIS — G4733 Obstructive sleep apnea (adult) (pediatric): Secondary | ICD-10-CM | POA: Diagnosis not present

## 2023-12-13 DIAGNOSIS — R0609 Other forms of dyspnea: Secondary | ICD-10-CM

## 2023-12-13 NOTE — Patient Instructions (Signed)
-   Will set you up for CPAP - Please work on losing weight as much as possible - Be consistent. Go to bed at the same time each night and get up at the same time each morning, including on the weekends - Make sure your bedroom is quiet, dark, relaxing, and at a comfortable temperature - Remove electronic devices, such as TVs, computers, and smart phones, from the bedroom - Avoid large meals, caffeine, and alcohol before bedtime - Get some exercise. Being physically active during the day can help you fall asleep more easily at night.

## 2023-12-13 NOTE — Assessment & Plan Note (Signed)
 Discussed with the patient about the importance of maintaining a healthy weight and the positive impact it can have on lung function. Encouraged them to reduce caloric intake and increase activity. Discussed consideration for GLP-1 agonists versus bariatric surgery. Patient will touch base with PCP regarding that.

## 2023-12-19 ENCOUNTER — Telehealth: Payer: Self-pay | Admitting: Pulmonary Disease

## 2023-12-19 NOTE — Telephone Encounter (Signed)
 Spoke with patient regarding the Tuesday 03/03/24 9:00am PFT appointment at [facility] time is 8:45 am--1st floor registration desk for check in---follow up with Dr. Catherine is 03/03/24 at 10:30 am.  Will mail information to patient and he voiced his understanding

## 2023-12-20 ENCOUNTER — Telehealth (HOSPITAL_BASED_OUTPATIENT_CLINIC_OR_DEPARTMENT_OTHER): Payer: Self-pay

## 2023-12-20 NOTE — Telephone Encounter (Signed)
 This was sent to [facility]. NFN

## 2023-12-20 NOTE — Telephone Encounter (Signed)
 Copied from CRM #[id number]. Topic: Clinical - Order For Equipment >> Dec 20, 2023 12:29 PM Russell PARAS wrote: Reason for CRM:   Anyssa, with [employer], is contacting clinic to notify provider they are out of network with pt's insurance and order for CPAP will need to be sent to alternative DME company.

## 2024-02-10 ENCOUNTER — Ambulatory Visit

## 2024-02-10 VITALS — BP 128/81 | HR 87 | Ht 71.0 in | Wt >= 6400 oz

## 2024-02-10 DIAGNOSIS — F331 Major depressive disorder, recurrent, moderate: Secondary | ICD-10-CM

## 2024-02-10 DIAGNOSIS — I10 Essential (primary) hypertension: Secondary | ICD-10-CM | POA: Diagnosis not present

## 2024-02-10 DIAGNOSIS — J4520 Mild intermittent asthma, uncomplicated: Secondary | ICD-10-CM | POA: Diagnosis not present

## 2024-02-10 DIAGNOSIS — E1169 Type 2 diabetes mellitus with other specified complication: Secondary | ICD-10-CM

## 2024-02-10 DIAGNOSIS — G4733 Obstructive sleep apnea (adult) (pediatric): Secondary | ICD-10-CM | POA: Diagnosis not present

## 2024-02-10 DIAGNOSIS — E785 Hyperlipidemia, unspecified: Secondary | ICD-10-CM

## 2024-02-10 DIAGNOSIS — E559 Vitamin D deficiency, unspecified: Secondary | ICD-10-CM

## 2024-02-10 DIAGNOSIS — K219 Gastro-esophageal reflux disease without esophagitis: Secondary | ICD-10-CM

## 2024-02-10 MED ORDER — LOSARTAN POTASSIUM 50 MG PO TABS: 50.0000 mg | ORAL_TABLET | Freq: Every day | ORAL | 3 refills | Status: AC

## 2024-02-10 MED ORDER — ESCITALOPRAM OXALATE 5 MG PO TABS: 5.0000 mg | ORAL_TABLET | Freq: Every day | ORAL | 1 refills | Status: AC

## 2024-02-10 MED ORDER — AMLODIPINE BESYLATE 5 MG PO TABS: 5.0000 mg | ORAL_TABLET | Freq: Every day | ORAL | 3 refills | Status: AC

## 2024-02-10 MED ORDER — ATORVASTATIN CALCIUM 40 MG PO TABS: 40.0000 mg | ORAL_TABLET | Freq: Every day | ORAL | 3 refills | Status: AC

## 2024-02-10 MED ORDER — HYDROCHLOROTHIAZIDE 25 MG PO TABS: 25.0000 mg | ORAL_TABLET | Freq: Every day | ORAL | 3 refills | Status: AC

## 2024-02-10 MED ORDER — ALBUTEROL SULFATE HFA 108 (90 BASE) MCG/ACT IN AERS: 1.0000 | INHALATION_SPRAY | Freq: Four times a day (QID) | RESPIRATORY_TRACT | 2 refills | Status: AC | PRN

## 2024-02-10 NOTE — Progress Notes (Signed)
 Established Patient Office Visit  Subjective   Patient ID: Bobby Bridges, male    DOB: Oct 18, 1975  Age: 48 y.o. MRN: [id number]  Chief Complaint  Patient presents with   Medical Management of Chronic Issues    3 month follow up     HPI Discussed the use of AI scribe software for clinical note transcription with the patient, who gave verbal consent to proceed.  History of Present Illness    Bobby Bridges is a 48 year old male who presents for a routine six-month follow-up and medication management.  Hypertension and edema - Fluctuating blood pressure with previous episodes of both elevated and decreased readings - Previous prescription for diuretics to manage lower extremity swelling, associated with prolonged standing at work - Considering use of compression socks for edema management  Obesity and weight management - Current weight 423 pounds, decreased from 430 pounds - Previous discussion regarding weight loss surgery and pharmacotherapy - Insurance does not cover weight loss injection therapy  Sleep disturbance and suspected sleep apnea - Difficulty initiating sleep, especially after late work shifts - Requires air conditioner to fall asleep - Scheduled for sleep study on November 25th for evaluation of possible sleep apnea  Diabetes mellitus - Diagnosed with diabetes, uncertain about specific details of diagnosis - Plans to have diabetes status re-evaluated  Occupational stress and anxiety symptoms - 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, 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 - Interested in medication to help manage anxiety and nerves related to work environment     Patient Active Problem List   Diagnosis Date Noted   Vitamin D  deficiency 08/03/2022   Screening for colorectal cancer 01/24/2022   Annual physical exam 12/14/2021   Type 2 diabetes mellitus with other specified complication (HCC) 09/01/2021   Mild intermittent asthma  without complication 05/30/2021   Hyperlipidemia associated with type 2 diabetes mellitus (HCC) 05/30/2021   Erectile dysfunction 05/30/2021   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   Ankle edema, bilateral 05/30/2021   Elevated troponin 09/22/2014   Nausea 09/22/2014   Major depression 02/04/2012   Major depressive disorder, recurrent episode 12/02/2011    Class: Acute   REDNESS OR DISCHARGE OF EYE 02/03/2008   ALOPECIA 02/03/2008   HYPERLIPIDEMIA 06/27/2006   ALLERGIC RHINITIS, SEASONAL 06/27/2006   Severe obstructive sleep apnea 06/27/2006   HYPERGLYCEMIA 06/27/2006   Morbid obesity (HCC) 04/23/2006   DEPRESSION 04/23/2006   Hypertension 04/23/2006   ALLERGIC RHINITIS 04/23/2006   BRONCHITIS NOS 04/23/2006   Asthma 04/23/2006   SEIZURE DISORDER 04/23/2006      ROS    Objective:     BP 128/81   Pulse 87   Ht 5' 11 (1.803 m)   Wt (!) 423 lb (191.9 kg)   SpO2 98%   BMI 59.00 kg/m  BP Readings from Last 3 Encounters:  02/10/24 128/81  12/13/23 (!) 127/90  12/09/23 (!) 120/96   Wt Readings from Last 3 Encounters:  02/10/24 (!) 423 lb (191.9 kg)  12/13/23 (!) 424 lb 6.4 oz (192.5 kg)  12/09/23 (!) 425 lb (192.8 kg)      Physical Exam Vitals and nursing note reviewed.  Constitutional:      Appearance: Normal appearance. He is obese.  HENT:     Head: Normocephalic.     Right Ear: Tympanic membrane, ear canal and external ear normal.     Left Ear: Tympanic membrane, ear canal and external ear normal.     Nose: Nose normal.  Mouth/Throat:     Mouth: Mucous membranes are moist.     Pharynx: Oropharynx is clear.  Cardiovascular:     Rate and Rhythm: Normal rate and regular rhythm.  Pulmonary:     Effort: Pulmonary effort is normal.     Breath sounds: Normal breath sounds.  Musculoskeletal:     Cervical back: Normal range of motion and neck supple.  Skin:    General: Skin is warm and dry.  Neurological:     Mental Status: He is alert and oriented to person,  place, and time.  Psychiatric:        Mood and Affect: Mood normal.        Thought Content: Thought content normal.     Diabetic foot exam was performed with the following findings:   No deformities, ulcerations, or other skin breakdown Normal sensation of 10g monofilament Intact posterior tibialis and dorsalis pedis pulses     Last CBC Lab Results  Component Value Date   WBC 5.5 02/10/2024   HGB 14.3 02/10/2024   HCT 46.2 02/10/2024   MCV 84 02/10/2024   MCH 26.0 (L) 02/10/2024   RDW 13.6 02/10/2024   PLT 265 02/10/2024   Last metabolic panel Lab Results  Component Value Date   GLUCOSE 97 02/10/2024   NA 141 02/10/2024   K 4.4 02/10/2024   CL 108 (H) 02/10/2024   CO2 21 02/10/2024   BUN 15 02/10/2024   CREATININE 0.95 02/10/2024   GFRNONAA >60 12/09/2023   CALCIUM  9.1 02/10/2024   PROT 6.8 02/10/2024   ALBUMIN 3.7 (L) 02/10/2024   LABGLOB 3.1 02/10/2024   AGRATIO 1.3 03/19/2022   BILITOT 0.4 02/10/2024   ALKPHOS 106 02/10/2024   AST 21 02/10/2024   ALT 25 02/10/2024   ANIONGAP 8 12/09/2023   Last lipids Lab Results  Component Value Date   CHOL 204 (H) 02/10/2024   HDL 36 (L) 02/10/2024   LDLCALC 154 (H) 02/10/2024   TRIG 75 02/10/2024   CHOLHDL 5.7 (H) 02/10/2024   Last hemoglobin A1c Lab Results  Component Value Date   HGBA1C 5.9 (H) 02/10/2024   Last thyroid functions Lab Results  Component Value Date   TSH 2.040 02/10/2024   FREET4 1.12 02/10/2024   Last vitamin D  Lab Results  Component Value Date   VD25OH 13.1 (L) 02/10/2024   Last vitamin B12 and Folate Lab Results  Component Value Date   VITAMINB12 280 05/03/2023   FOLATE 8.9 05/03/2023      The 10-year ASCVD risk score (Arnett DK, et al., 2019) is: 15.4%    Assessment & Plan:   Problem List Items Addressed This Visit       Cardiovascular and Mediastinum   Hypertension   Blood pressure well-managed. No new medications needed.      Relevant Medications    hydrochlorothiazide  (HYDRODIURIL ) 25 MG tablet   atorvastatin  (LIPITOR) 40 MG tablet   amLODipine  (NORVASC ) 5 MG tablet   losartan  (COZAAR ) 50 MG tablet   Other Relevant Orders   CMP14+EGFR (Completed)     Respiratory   Asthma - Primary   He states that he used albuterol  inhaler a long time ago .sometimes gets SOB with wheezing RX albuterol  inhaler take 2 puffs every 6 hours PRN SOB, wheezing        Relevant Medications   albuterol  (VENTOLIN  HFA) 108 (90 Base) MCG/ACT inhaler   Severe obstructive sleep apnea   Scheduled sleep study to confirm diagnosis. Discussed CPAP and Inspire device  as treatment options if diagnosed. - Complete sleep study on November 25th. - Consider CPAP if diagnosed with sleep apnea. - Evaluate for Inspire device if appropriate.      Relevant Orders   CBC with Differential/Platelet (Completed)     Digestive   RESOLVED: GERD   Relevant Orders   VITAMIN D  25 Hydroxy (Vit-D Deficiency, Fractures) (Completed)     Endocrine   Hyperlipidemia associated with type 2 diabetes mellitus (HCC)   Relevant Medications   hydrochlorothiazide  (HYDRODIURIL ) 25 MG tablet   atorvastatin  (LIPITOR) 40 MG tablet   amLODipine  (NORVASC ) 5 MG tablet   losartan  (COZAAR ) 50 MG tablet   Other Relevant Orders   Lipid panel (Completed)   Type 2 diabetes mellitus with other specified complication (HCC)   Recheck fasting labs today.  Currently not on medications.  He was previously on Mounjaro , but no longer covered by insurance.        Relevant Medications   atorvastatin  (LIPITOR) 40 MG tablet   losartan  (COZAAR ) 50 MG tablet   Other Relevant Orders   CMP14+EGFR (Completed)   Hemoglobin A1c (Completed)   HM Diabetes Foot Exam (Completed)     Other   Morbid obesity (HCC)   Weight decreased from 430 lbs to 423 lbs. Discussed weight loss surgery or medication pending sleep study results. Insurance does not cover weight loss medication. - Continue weight management  efforts. - Await sleep study results to determine eligibility for weight loss medication.      Relevant Orders   CBC with Differential/Platelet (Completed)   CMP14+EGFR (Completed)   Lipid panel (Completed)   Hemoglobin A1c (Completed)   TSH + free T4 (Completed)   VITAMIN D  25 Hydroxy (Vit-D Deficiency, Fractures) (Completed)   Major depressive disorder, recurrent episode   Stress and anxiety related to work. Discussed starting Lexapro at a low dose. - Prescribe Lexapro at a low dose.      Relevant Medications   escitalopram (LEXAPRO) 5 MG tablet   Vitamin D  deficiency   Relevant Orders   VITAMIN D  25 Hydroxy (Vit-D Deficiency, Fractures) (Completed)    No follow-ups on file.    Leita Longs, FNP

## 2024-02-11 LAB — TSH+FREE T4
Free T4: 1.12 ng/dL (ref 0.82–1.77)
TSH: 2.04 u[IU]/mL (ref 0.450–4.500)

## 2024-02-11 LAB — LIPID PANEL
Chol/HDL Ratio: 5.7 ratio — ABNORMAL HIGH (ref 0.0–5.0)
Cholesterol, Total: 204 mg/dL — ABNORMAL HIGH (ref 100–199)
HDL: 36 mg/dL — ABNORMAL LOW (ref >39)
LDL Chol Calc (NIH): 154 mg/dL — ABNORMAL HIGH (ref 0–99)
Triglycerides: 75 mg/dL (ref 0–149)
VLDL Cholesterol Cal: 14 mg/dL (ref 5–40)

## 2024-02-11 LAB — CMP14+EGFR
ALT: 25 IU/L (ref 0–44)
AST: 21 IU/L (ref 0–40)
Albumin: 3.7 g/dL — ABNORMAL LOW (ref 4.1–5.1)
Alkaline Phosphatase: 106 IU/L (ref 47–123)
BUN/Creatinine Ratio: 16 (ref 9–20)
BUN: 15 mg/dL (ref 6–24)
Bilirubin Total: 0.4 mg/dL (ref 0.0–1.2)
CO2: 21 mmol/L (ref 20–29)
Calcium: 9.1 mg/dL (ref 8.7–10.2)
Chloride: 108 mmol/L — ABNORMAL HIGH (ref 96–106)
Creatinine, Ser: 0.95 mg/dL (ref 0.76–1.27)
Globulin, Total: 3.1 g/dL (ref 1.5–4.5)
Glucose: 97 mg/dL (ref 70–99)
Potassium: 4.4 mmol/L (ref 3.5–5.2)
Sodium: 141 mmol/L (ref 134–144)
Total Protein: 6.8 g/dL (ref 6.0–8.5)
eGFR: 99 mL/min/1.73 (ref >59)

## 2024-02-11 LAB — CBC WITH DIFFERENTIAL/PLATELET
Basophils Absolute: 0 x10E3/uL (ref 0.0–0.2)
Basos: 1 %
EOS (ABSOLUTE): 0.1 x10E3/uL (ref 0.0–0.4)
Eos: 2 %
Hematocrit: 46.2 % (ref 37.5–51.0)
Hemoglobin: 14.3 g/dL (ref 13.0–17.7)
Immature Grans (Abs): 0 x10E3/uL (ref 0.0–0.1)
Immature Granulocytes: 0 %
Lymphocytes Absolute: 2.4 x10E3/uL (ref 0.7–3.1)
Lymphs: 43 %
MCH: 26 pg — ABNORMAL LOW (ref 26.6–33.0)
MCHC: 31 g/dL — ABNORMAL LOW (ref 31.5–35.7)
MCV: 84 fL (ref 79–97)
Monocytes Absolute: 0.5 x10E3/uL (ref 0.1–0.9)
Monocytes: 9 %
Neutrophils Absolute: 2.5 x10E3/uL (ref 1.4–7.0)
Neutrophils: 45 %
Platelets: 265 x10E3/uL (ref 150–450)
RBC: 5.49 x10E6/uL (ref 4.14–5.80)
RDW: 13.6 % (ref 11.6–15.4)
WBC: 5.5 x10E3/uL (ref 3.4–10.8)

## 2024-02-11 LAB — HEMOGLOBIN A1C
Est. average glucose Bld gHb Est-mCnc: 123 mg/dL
Hgb A1c MFr Bld: 5.9 % — ABNORMAL HIGH (ref 4.8–5.6)

## 2024-02-11 LAB — VITAMIN D 25 HYDROXY (VIT D DEFICIENCY, FRACTURES): Vit D, 25-Hydroxy: 13.1 ng/mL — ABNORMAL LOW (ref 30.0–100.0)

## 2024-02-16 ENCOUNTER — Ambulatory Visit: Payer: Self-pay

## 2024-02-16 ENCOUNTER — Other Ambulatory Visit: Payer: Self-pay

## 2024-02-16 DIAGNOSIS — E559 Vitamin D deficiency, unspecified: Secondary | ICD-10-CM

## 2024-02-16 DIAGNOSIS — I10 Essential (primary) hypertension: Secondary | ICD-10-CM

## 2024-02-16 MED ORDER — VITAMIN D (ERGOCALCIFEROL) 1.25 MG (50000 UNIT) PO CAPS: 50000.0000 [IU] | ORAL_CAPSULE | ORAL | 5 refills | Status: AC

## 2024-02-16 NOTE — Assessment & Plan Note (Signed)
 Blood pressure well-managed. No new medications needed.

## 2024-02-16 NOTE — Assessment & Plan Note (Signed)
 Stress and anxiety related to work. Discussed starting Lexapro at a low dose. - Prescribe Lexapro at a low dose.

## 2024-02-16 NOTE — Assessment & Plan Note (Signed)
 Recheck fasting labs today.  Currently not on medications.  He was previously on Mounjaro , but no longer covered by insurance.

## 2024-02-16 NOTE — Assessment & Plan Note (Signed)
 Scheduled sleep study to confirm diagnosis. Discussed CPAP and Inspire device as treatment options if diagnosed. - Complete sleep study on November 25th. - Consider CPAP if diagnosed with sleep apnea. - Evaluate for Inspire device if appropriate.

## 2024-02-16 NOTE — Assessment & Plan Note (Signed)
 He states that he used albuterol  inhaler a long time ago .sometimes gets SOB with wheezing RX albuterol  inhaler take 2 puffs every 6 hours PRN SOB, wheezing

## 2024-02-16 NOTE — Assessment & Plan Note (Signed)
 Weight decreased from 430 lbs to 423 lbs. Discussed weight loss surgery or medication pending sleep study results. Insurance does not cover weight loss medication. - Continue weight management efforts. - Await sleep study results to determine eligibility for weight loss medication.

## 2024-03-02 NOTE — Progress Notes (Unsigned)
   Established Patient Pulmonology Office Visit   Subjective:  Patient ID: Bobby Bridges, male    DOB: 02-Jul-1975  MRN: [id number]  CC: No chief complaint on file.   HPI Mr. Bobby Bridges is a 48 y/o M with a PMH significant for severe OSA and asthma who presents for follow up.  He was last seen on 12/13/2023, at the time, started on autoPAP 5-20 cm H2O. I also recommended PFTs for evaluation of dyspnea.   {PULM QUESTIONNAIRES (Optional):33196}  ROS  {History (Optional):23778}  Current Outpatient Medications:    albuterol  (VENTOLIN  HFA) 108 (90 Base) MCG/ACT inhaler, Inhale 1-2 puffs into the lungs every 6 (six) hours as needed for wheezing or shortness of breath., Disp: 18 g, Rfl: 2   amLODipine  (NORVASC ) 5 MG tablet, Take 1 tablet (5 mg total) by mouth daily., Disp: 90 tablet, Rfl: 3   atorvastatin  (LIPITOR) 40 MG tablet, Take 1 tablet (40 mg total) by mouth daily., Disp: 90 tablet, Rfl: 3   escitalopram  (LEXAPRO ) 5 MG tablet, Take 1 tablet (5 mg total) by mouth daily., Disp: 90 tablet, Rfl: 1   hydrochlorothiazide  (HYDRODIURIL ) 25 MG tablet, Take 1 tablet (25 mg total) by mouth daily., Disp: 90 tablet, Rfl: 3   losartan  (COZAAR ) 50 MG tablet, Take 1 tablet (50 mg total) by mouth daily., Disp: 90 tablet, Rfl: 3   Vitamin D , Ergocalciferol , (DRISDOL ) 1.25 MG (50000 UNIT) CAPS capsule, Take 1 capsule (50,000 Units total) by mouth every 7 (seven) days., Disp: 5 capsule, Rfl: 5      Objective:  There were no vitals taken for this visit. {Pulm Vitals (Optional):32837}  Physical Exam   Diagnostic Review:  {Labs (Optional):32838}  CTA Chest 2021: no large territory PE, cardiomegaly, no masses, nodules, or infiltrates. Theres' bronchial wall thickening   CXR 12/09/2023: cardiomegaly, no infiltrates     Assessment & Plan:   Assessment & Plan   No orders of the defined types were placed in this encounter.     No follow-ups on file.   Raneisha Bress, MD

## 2024-03-03 ENCOUNTER — Encounter: Payer: Self-pay | Admitting: Pulmonary Disease

## 2024-03-03 ENCOUNTER — Other Ambulatory Visit (HOSPITAL_COMMUNITY): Payer: Self-pay | Admitting: Respiratory Therapy

## 2024-03-03 ENCOUNTER — Ambulatory Visit (HOSPITAL_COMMUNITY)
Admission: RE | Admit: 2024-03-03 | Discharge: 2024-03-03 | Disposition: A | Discharge status: Home / Self Care | Source: Ambulatory Visit | Attending: Pulmonary Disease | Admitting: Pulmonary Disease

## 2024-03-03 ENCOUNTER — Ambulatory Visit (INDEPENDENT_AMBULATORY_CARE_PROVIDER_SITE_OTHER): Admitting: Pulmonary Disease

## 2024-03-03 VITALS — BP 127/73 | HR 98 | Ht 71.0 in | Wt >= 6400 oz

## 2024-03-03 DIAGNOSIS — J984 Other disorders of lung: Secondary | ICD-10-CM | POA: Diagnosis not present

## 2024-03-03 DIAGNOSIS — G4733 Obstructive sleep apnea (adult) (pediatric): Secondary | ICD-10-CM

## 2024-03-03 DIAGNOSIS — R0609 Other forms of dyspnea: Secondary | ICD-10-CM | POA: Diagnosis present

## 2024-03-03 DIAGNOSIS — Z6841 Body Mass Index (BMI) 40.0 and over, adult: Secondary | ICD-10-CM | POA: Diagnosis not present

## 2024-03-03 LAB — PULMONARY FUNCTION TEST
DL/VA % pred: 137 %
DL/VA: 6.15 ml/min/mmHg/L
DLCO unc % pred: 88 %
DLCO unc: 27.21 ml/min/mmHg
FEF 25-75 Post: 4.03 L/s
FEF 25-75 Pre: 4.16 L/s
FEF2575-%Change-Post: -3 %
FEF2575-%Pred-Post: 108 %
FEF2575-%Pred-Pre: 111 %
FEV1-%Change-Post: -2 %
FEV1-%Pred-Post: 68 %
FEV1-%Pred-Pre: 70 %
FEV1-Post: 2.85 L
FEV1-Pre: 2.92 L
FEV1FVC-%Change-Post: -1 %
FEV1FVC-%Pred-Pre: 112 %
FEV6-%Change-Post: 0 %
FEV6-%Pred-Post: 64 %
FEV6-%Pred-Pre: 64 %
FEV6-Post: 3.3 L
FEV6-Pre: 3.29 L
FEV6FVC-%Change-Post: 0 %
FEV6FVC-%Pred-Post: 103 %
FEV6FVC-%Pred-Pre: 103 %
FVC-%Change-Post: 0 %
FVC-%Pred-Post: 62 %
FVC-%Pred-Pre: 62 %
FVC-Post: 3.3 L
FVC-Pre: 3.32 L
Post FEV1/FVC ratio: 86 %
Post FEV6/FVC ratio: 100 %
Pre FEV1/FVC ratio: 88 %
Pre FEV6/FVC Ratio: 100 %
RV % pred: 77 %
RV: 1.57 L
TLC % pred: 69 %
TLC: 4.98 L

## 2024-03-03 MED ORDER — ALBUTEROL SULFATE (2.5 MG/3ML) 0.083% IN NEBU
2.5000 mg | INHALATION_SOLUTION | Freq: Once | RESPIRATORY_TRACT | Status: AC
  Administered 2024-03-03: 2.5 mg via RESPIRATORY_TRACT

## 2024-03-03 NOTE — Patient Instructions (Signed)
  VISIT SUMMARY: You came in today for a follow-up on your breathing and sleep studies. Your tests show severe sleep apnea and some lung function issues likely related to obesity. We discussed starting CPAP therapy and weight loss options to help improve your symptoms.  YOUR PLAN: SEVERE OBSTRUCTIVE SLEEP APNEA: Your sleep study confirmed severe sleep apnea, which is causing your daytime fatigue and nighttime crashes. -We are arranging for your CPAP machine to be delivered and for you to start CPAP therapy. -If you do not hear from us  within a week, please send a message to the provider.  OBESITY-RELATED RESTRICTIVE LUNG DISEASE: Your lung function tests show reduced capacity, likely due to obesity. This is affecting your breathing and overall lung function. -Discuss weight loss options with your primary care provider. This may include medications like Ozempic  or surgical options like gastric bypass. -Weight loss is encouraged to help improve your lung function and sleep apnea.   Contains text generated by [employer].

## 2024-03-03 NOTE — Assessment & Plan Note (Signed)
 Discussed with the patient about the importance of maintaining a healthy weight and the positive impact it can have on lung function. Encouraged them to reduce caloric intake and increase activity.

## 2024-06-15 ENCOUNTER — Ambulatory Visit: Admitting: Pulmonary Disease

## 2024-08-10 ENCOUNTER — Ambulatory Visit
